# Patient Record
Sex: Female | Born: 1935 | Race: White | Hispanic: No | Smoking: Former smoker
Health system: Southern US, Community
[De-identification: ages and names within clinical notes are randomized; demographics above are authoritative.]

## PROBLEM LIST (undated history)

## (undated) DIAGNOSIS — I482 Chronic atrial fibrillation, unspecified: Secondary | ICD-10-CM

## (undated) DIAGNOSIS — J449 Chronic obstructive pulmonary disease, unspecified: Secondary | ICD-10-CM

## (undated) DIAGNOSIS — G629 Polyneuropathy, unspecified: Secondary | ICD-10-CM

## (undated) DIAGNOSIS — J40 Bronchitis, not specified as acute or chronic: Secondary | ICD-10-CM

## (undated) DIAGNOSIS — E785 Hyperlipidemia, unspecified: Secondary | ICD-10-CM

## (undated) DIAGNOSIS — E7800 Pure hypercholesterolemia, unspecified: Secondary | ICD-10-CM

## (undated) DIAGNOSIS — E119 Type 2 diabetes mellitus without complications: Secondary | ICD-10-CM

## (undated) DIAGNOSIS — I10 Essential (primary) hypertension: Secondary | ICD-10-CM

## (undated) DIAGNOSIS — I5030 Unspecified diastolic (congestive) heart failure: Secondary | ICD-10-CM

## (undated) HISTORY — DX: Chronic obstructive pulmonary disease, unspecified: J44.9

## (undated) HISTORY — DX: Hyperlipidemia, unspecified: E78.5

## (undated) HISTORY — PX: BLADDER SURGERY: SHX569

## (undated) HISTORY — DX: Essential (primary) hypertension: I10

## (undated) HISTORY — PX: CHOLECYSTECTOMY: SHX55

## (undated) HISTORY — DX: Bronchitis, not specified as acute or chronic: J40

## (undated) HISTORY — PX: APPENDECTOMY: SHX54

## (undated) HISTORY — PX: THYROIDECTOMY, PARTIAL: SHX18

## (undated) HISTORY — PX: TOTAL ABDOMINAL HYSTERECTOMY: SHX209

## (undated) HISTORY — DX: Polyneuropathy, unspecified: G62.9

## (undated) HISTORY — PX: TONSILLECTOMY: SHX5217

---

## 2001-04-14 ENCOUNTER — Ambulatory Visit (HOSPITAL_COMMUNITY): Admission: RE | Admit: 2001-04-14 | Discharge: 2001-04-14 | Payer: Self-pay | Admitting: Internal Medicine

## 2001-04-14 ENCOUNTER — Encounter: Payer: Self-pay | Admitting: Internal Medicine

## 2002-02-23 ENCOUNTER — Encounter: Payer: Self-pay | Admitting: Internal Medicine

## 2002-02-23 ENCOUNTER — Ambulatory Visit (HOSPITAL_COMMUNITY): Admission: RE | Admit: 2002-02-23 | Discharge: 2002-02-23 | Payer: Self-pay | Admitting: Internal Medicine

## 2002-05-17 ENCOUNTER — Encounter: Payer: Self-pay | Admitting: Orthopaedic Surgery

## 2002-05-17 ENCOUNTER — Ambulatory Visit (HOSPITAL_COMMUNITY): Admission: RE | Admit: 2002-05-17 | Discharge: 2002-05-17 | Payer: Self-pay | Admitting: Orthopaedic Surgery

## 2003-04-19 ENCOUNTER — Encounter: Payer: Self-pay | Admitting: Internal Medicine

## 2003-04-19 ENCOUNTER — Ambulatory Visit (HOSPITAL_COMMUNITY): Admission: RE | Admit: 2003-04-19 | Discharge: 2003-04-19 | Payer: Self-pay | Admitting: Internal Medicine

## 2004-06-12 ENCOUNTER — Emergency Department (HOSPITAL_COMMUNITY): Admission: EM | Admit: 2004-06-12 | Discharge: 2004-06-12 | Payer: Self-pay | Admitting: Emergency Medicine

## 2005-03-11 ENCOUNTER — Ambulatory Visit (HOSPITAL_COMMUNITY): Admission: RE | Admit: 2005-03-11 | Discharge: 2005-03-11 | Payer: Self-pay | Admitting: Internal Medicine

## 2005-03-21 ENCOUNTER — Ambulatory Visit (HOSPITAL_COMMUNITY): Admission: RE | Admit: 2005-03-21 | Discharge: 2005-03-21 | Payer: Self-pay | Admitting: Internal Medicine

## 2006-02-03 ENCOUNTER — Ambulatory Visit (HOSPITAL_COMMUNITY): Admission: RE | Admit: 2006-02-03 | Discharge: 2006-02-03 | Payer: Self-pay | Admitting: Internal Medicine

## 2007-08-10 ENCOUNTER — Ambulatory Visit (HOSPITAL_COMMUNITY): Admission: RE | Admit: 2007-08-10 | Discharge: 2007-08-10 | Payer: Self-pay | Admitting: Internal Medicine

## 2007-11-17 ENCOUNTER — Encounter: Payer: Self-pay | Admitting: Internal Medicine

## 2007-11-17 ENCOUNTER — Ambulatory Visit (HOSPITAL_COMMUNITY): Admission: RE | Admit: 2007-11-17 | Discharge: 2007-11-17 | Payer: Self-pay | Admitting: Family Medicine

## 2008-01-08 ENCOUNTER — Emergency Department (HOSPITAL_COMMUNITY): Admission: EM | Admit: 2008-01-08 | Discharge: 2008-01-09 | Payer: Self-pay | Admitting: Emergency Medicine

## 2008-03-07 ENCOUNTER — Encounter: Payer: Self-pay | Admitting: Internal Medicine

## 2008-06-07 ENCOUNTER — Ambulatory Visit: Payer: Self-pay | Admitting: Ophthalmology

## 2008-11-15 ENCOUNTER — Encounter: Payer: Self-pay | Admitting: Internal Medicine

## 2008-11-15 ENCOUNTER — Ambulatory Visit (HOSPITAL_COMMUNITY): Admission: RE | Admit: 2008-11-15 | Discharge: 2008-11-15 | Payer: Self-pay | Admitting: Pulmonary Disease

## 2008-11-16 ENCOUNTER — Encounter: Payer: Self-pay | Admitting: Internal Medicine

## 2008-12-26 ENCOUNTER — Encounter: Payer: Self-pay | Admitting: Internal Medicine

## 2009-01-13 ENCOUNTER — Ambulatory Visit: Payer: Self-pay | Admitting: Internal Medicine

## 2009-01-13 DIAGNOSIS — E785 Hyperlipidemia, unspecified: Secondary | ICD-10-CM

## 2009-01-13 DIAGNOSIS — J449 Chronic obstructive pulmonary disease, unspecified: Secondary | ICD-10-CM

## 2009-01-13 DIAGNOSIS — E1142 Type 2 diabetes mellitus with diabetic polyneuropathy: Secondary | ICD-10-CM | POA: Insufficient documentation

## 2009-01-16 ENCOUNTER — Telehealth (INDEPENDENT_AMBULATORY_CARE_PROVIDER_SITE_OTHER): Payer: Self-pay | Admitting: *Deleted

## 2009-01-20 ENCOUNTER — Encounter: Payer: Self-pay | Admitting: Internal Medicine

## 2009-01-20 ENCOUNTER — Ambulatory Visit (HOSPITAL_COMMUNITY): Admission: RE | Admit: 2009-01-20 | Discharge: 2009-01-20 | Payer: Self-pay | Admitting: General Surgery

## 2009-02-15 ENCOUNTER — Ambulatory Visit: Payer: Self-pay | Admitting: Internal Medicine

## 2009-02-15 DIAGNOSIS — R911 Solitary pulmonary nodule: Secondary | ICD-10-CM

## 2009-02-17 ENCOUNTER — Telehealth: Payer: Self-pay | Admitting: Internal Medicine

## 2009-02-24 ENCOUNTER — Encounter: Payer: Self-pay | Admitting: Internal Medicine

## 2009-02-25 ENCOUNTER — Encounter: Payer: Self-pay | Admitting: Internal Medicine

## 2009-02-27 ENCOUNTER — Ambulatory Visit (HOSPITAL_COMMUNITY): Admission: RE | Admit: 2009-02-27 | Discharge: 2009-02-27 | Payer: Self-pay | Admitting: Pulmonary Disease

## 2009-02-27 ENCOUNTER — Encounter: Payer: Self-pay | Admitting: Internal Medicine

## 2009-05-01 ENCOUNTER — Ambulatory Visit: Payer: Self-pay | Admitting: Orthopedic Surgery

## 2009-05-01 DIAGNOSIS — M1710 Unilateral primary osteoarthritis, unspecified knee: Secondary | ICD-10-CM

## 2009-05-01 DIAGNOSIS — M25569 Pain in unspecified knee: Secondary | ICD-10-CM

## 2009-06-16 ENCOUNTER — Ambulatory Visit: Payer: Self-pay | Admitting: Internal Medicine

## 2009-07-03 ENCOUNTER — Telehealth: Payer: Self-pay | Admitting: Internal Medicine

## 2009-08-02 ENCOUNTER — Ambulatory Visit: Payer: Self-pay | Admitting: Orthopedic Surgery

## 2009-08-10 ENCOUNTER — Telehealth: Payer: Self-pay | Admitting: Orthopedic Surgery

## 2009-08-10 ENCOUNTER — Ambulatory Visit: Payer: Self-pay | Admitting: Orthopedic Surgery

## 2009-08-18 ENCOUNTER — Ambulatory Visit: Payer: Self-pay | Admitting: Internal Medicine

## 2009-08-18 DIAGNOSIS — J441 Chronic obstructive pulmonary disease with (acute) exacerbation: Secondary | ICD-10-CM

## 2009-08-29 ENCOUNTER — Ambulatory Visit (HOSPITAL_COMMUNITY): Admission: RE | Admit: 2009-08-29 | Discharge: 2009-08-29 | Payer: Self-pay | Admitting: Internal Medicine

## 2009-12-11 ENCOUNTER — Encounter: Payer: Self-pay | Admitting: Internal Medicine

## 2009-12-11 ENCOUNTER — Ambulatory Visit: Payer: Self-pay | Admitting: Orthopedic Surgery

## 2009-12-13 ENCOUNTER — Encounter: Payer: Self-pay | Admitting: Orthopedic Surgery

## 2009-12-22 ENCOUNTER — Ambulatory Visit: Payer: Self-pay | Admitting: Internal Medicine

## 2010-01-18 ENCOUNTER — Ambulatory Visit: Payer: Self-pay | Admitting: Internal Medicine

## 2010-02-22 ENCOUNTER — Ambulatory Visit: Payer: Self-pay | Admitting: Internal Medicine

## 2010-05-16 ENCOUNTER — Telehealth (INDEPENDENT_AMBULATORY_CARE_PROVIDER_SITE_OTHER): Payer: Self-pay | Admitting: *Deleted

## 2010-06-04 ENCOUNTER — Telehealth (INDEPENDENT_AMBULATORY_CARE_PROVIDER_SITE_OTHER): Payer: Self-pay | Admitting: *Deleted

## 2010-06-07 ENCOUNTER — Ambulatory Visit: Payer: Self-pay | Admitting: Internal Medicine

## 2010-06-07 ENCOUNTER — Telehealth (INDEPENDENT_AMBULATORY_CARE_PROVIDER_SITE_OTHER): Payer: Self-pay | Admitting: *Deleted

## 2010-07-12 ENCOUNTER — Telehealth (INDEPENDENT_AMBULATORY_CARE_PROVIDER_SITE_OTHER): Payer: Self-pay | Admitting: *Deleted

## 2010-08-07 ENCOUNTER — Ambulatory Visit: Payer: Self-pay | Admitting: Internal Medicine

## 2010-09-24 ENCOUNTER — Ambulatory Visit: Payer: Self-pay | Admitting: Ophthalmology

## 2010-10-09 ENCOUNTER — Ambulatory Visit: Payer: Self-pay | Admitting: Ophthalmology

## 2010-10-22 ENCOUNTER — Telehealth (INDEPENDENT_AMBULATORY_CARE_PROVIDER_SITE_OTHER): Payer: Self-pay | Admitting: *Deleted

## 2010-11-22 ENCOUNTER — Telehealth (INDEPENDENT_AMBULATORY_CARE_PROVIDER_SITE_OTHER): Payer: Self-pay | Admitting: *Deleted

## 2010-12-05 ENCOUNTER — Ambulatory Visit
Admission: RE | Admit: 2010-12-05 | Discharge: 2010-12-05 | Payer: Self-pay | Source: Home / Self Care | Attending: Internal Medicine | Admitting: Internal Medicine

## 2010-12-11 NOTE — Progress Notes (Signed)
Summary: sob  Phone Note Call from Patient   Caller: Patient Call For: young Summary of Call: need to talk to nurse about breathing problem antibiotic have not helped Initial call taken by: Rickard Patience,  June 07, 2010 11:11 AM  Follow-up for Phone Call        Spoke with pt.  She states that she is having alot of SOB, sounded like she was having diss with breathing on the phone, had to pause frequently to catch her breath.  I sched her appt with Dr Maple Hudson for this afternoon at 3:45 pm this afternoon. ER sooner if needed. Follow-up by: Vernie Murders,  June 07, 2010 11:16 AM

## 2010-12-11 NOTE — Progress Notes (Signed)
Summary: req maintainence prednisone > ok for prednisone 5mg  until appt  Phone Note Call from Patient   Caller: Patient Call For: young Summary of Call: would like to no if she can get a prescript for prednisone for  copd [place] [facility] Initial call taken by: Rickard Patience,  July 12, 2010 3:45 PM  Follow-up for Phone Call        Spoke with pt.  She was last seen here 06/07/10 and given rx for prednisone- she down to 1 tablet left- and states that she is doing really well with breathing but fears that she will decline again after she stops pred.  She would like to know if Dr. Maple Hudson thinks it may be helpful for her to stay on low dose maint pred.  Pls advise, thanks! nkda Follow-up by: Vernie Murders,  July 12, 2010 3:53 PM  Additional Follow-up for Phone Call Additional follow up Details #1::        Try prednisone 5mg  # 10, Take one daily till apt on Sept 7 Additional Follow-up by: Waymon Budge MD,  July 13, 2010 9:09 AM    Additional Follow-up for Phone Call Additional follow up Details #2::    Dr. Maple Hudson, I looked in pt's appt list to verify next ov and her appt is actually on Sept 27th.  Do you want to give her enough prednisone to last unitl then taking 5 mg once daily? Pls advise thanks! Follow-up by: Vernie Murders,  July 13, 2010 9:13 AM  Additional Follow-up for Phone Call Additional follow up Details #3:: Details for Additional Follow-up Action Taken: OK to continue maintenance till next appointment- Thanks. Additional Follow-up by: Waymon Budge MD,  July 13, 2010 10:05 AM  New/Updated Medications: PREDNISONE 5 MG TABS (PREDNISONE) Take 1 tablet by mouth once a day until 08-07-10 appointment Prescriptions: PREDNISONE 5 MG TABS (PREDNISONE) Take 1 tablet by mouth once a day until 08-07-10 appointment  #25 x 0   Entered by:   Boone Master CNA/MA   Authorized by:   Waymon Budge MD   Signed by:   Boone Master CNA/MA on 07/13/2010   Method  used:   Electronically to        [facility] (retail)       [address] [place], [place]  [address]       Ph: [phone]       Fax: [phone]   RxID:   [id number]    called spoke with patient, advised of CDYs recs as stated above.  pt verbalized her understanding.  rx for prednisone 5mg  #25, 1 by mouth once daily no refills sent to [facility]. Boone Master CNA/MA  July 13, 2010 10:12 AM

## 2010-12-11 NOTE — Progress Notes (Signed)
Summary: bronchitis  Phone Note Call from Patient   Caller: Patient Call For: young Summary of Call: pt have bronchitis and wheezing using inhaler want to know whatelse she can take  [place] [facility] pls have med delivered Initial call taken by: Rickard Patience,  May 16, 2010 2:15 PM  Follow-up for Phone Call        called spoke with patient who c/o wheezing, cough occasionally producing clear-creamy colored mucus, increased SOB x2days.  denies f/c/s.  pt states she had these same symptoms 1 month ago and her PCP gave her a pred taper that resolved the symptoms.  pt has upcoming appt w/ CDY 8.12.11  please advise, thanks!  ALLERGIES: NKDA Follow-up by: Boone Master CNA/MA,  May 16, 2010 2:29 PM  Additional Follow-up for Phone Call Additional follow up Details #1::        Per CDY- give Prednisone 10mg  #20 take 4 x 2 days, 3 x 2 days, 2 x 2 days,  1 x 2 days, then stop; no refills and MUST keep appt in August with CDY.Reynaldo Minium CMA  May 16, 2010 3:04 PM     Additional Follow-up for Phone Call Additional follow up Details #2::    pt aware and med sent to pharmacy told them to deliver to pt Follow-up by: Philipp Deputy CMA,  May 16, 2010 3:16 PM  New/Updated Medications: PREDNISONE 10 MG TABS (PREDNISONE) 4 tabs x 2 days,3 tabs x 2, 2 tabs x 2 days, 1 tabs x 2 days then stop Prescriptions: PREDNISONE 10 MG TABS (PREDNISONE) 4 tabs x 2 days,3 tabs x 2, 2 tabs x 2 days, 1 tabs x 2 days then stop  #20 x 0   Entered by:   Philipp Deputy CMA   Authorized by:   Waymon Budge MD   Signed by:   Philipp Deputy CMA on 05/16/2010   Method used:   Electronically to        [facility] (retail)       [address]       [address] [place], [place]  [address]       Ph: [phone]       Fax: [phone]   RxID:   [id number]

## 2010-12-11 NOTE — Progress Notes (Signed)
Summary: BRONCHITIS  Phone Note Call from Patient   Caller: Patient Call For: Carrie Heath Summary of Call: NEED SOMETHING FOR BRONCHITIS PHARMACY [place] [facility] Initial call taken by: Rickard Patience,  June 04, 2010 10:28 AM  Follow-up for Phone Call        called and spoke with pt and she stated that she thinks she has the bronchitis again---coughing with SOB--SOB with walking and she has to use the oxygen all the time now.  she has appt on 8-12 to see CY--please advise. thanks Randell Loop CMA  June 04, 2010 10:49 AM   Additional Follow-up for Phone Call Additional follow up Details #1::        Offer her a Zpak, pending her appointment please. Additional Follow-up by: Waymon Budge MD,  June 04, 2010 12:04 PM    Additional Follow-up for Phone Call Additional follow up Details #2::    called spoke with patient, advised of CDY's recs as stated above.  rx sent to pt's verified pharmacy.  pt will keep upcoming appt 8.12.11. Boone Master CNA/MA  June 04, 2010 12:10 PM   New/Updated Medications: ZITHROMAX Z-PAK 250 MG TABS (AZITHROMYCIN) 2 today then 1 daily till gone Prescriptions: ZITHROMAX Z-PAK 250 MG TABS (AZITHROMYCIN) 2 today then 1 daily till gone  #8 x 0   Entered by:   Boone Master CNA/MA   Authorized by:   Waymon Budge MD   Signed by:   Boone Master CNA/MA on 06/04/2010   Method used:   Electronically to        [facility] (retail)       [address]       [place], [place]  [address]       Ph: [phone]       Fax: [phone]   RxID:   [id number]

## 2010-12-11 NOTE — Assessment & Plan Note (Signed)
Summary: 4 months/ mbw   Copy to:  Juanetta Gosling Primary Provider/Referring Provider:  Dwana Melena  CC:  Follow up visit-frequent nose bleeds(fresh blood)..  History of Present Illness: 02/15/09 Chronic bronchits                        Son is here and participates Slept poorly last night- heat and increased cough.  Having pains left parasternal area, intermittent dull ache, relieved by rolling over at night, and noticed more positionally rather than exertonal. Not related to eating or swallowing. Has chroinic tendency to food hangup at that level. Papaya tabs help as needed.  We reviewed CT CHEST from January- small nonspecific nodules, degenerative disk disease. Symbicort and nebulizer help temporarily but trigger some productive cough. Notes feet get dusky with sitting. PFT-01/20/09- Severe obstructive disease, FEV1/FVC 0.47. No response. Mild restriction.  August 18, 2009 Chronic cough, chronic bronchitis................sister in law here She is facing potential  right TKR- Dr Romeo Apple. Had flu vax. No breathing change since August.  Daily cough, white, no blood. Repeat CT 02/27/09- Stable noncalcified pulmonary nodules- recommend f/u 6 months. Incidental prom L thyroid.  December 22, 2009- Chronic cough, chronic bronchitis....................son here Epistaxis. Facing knee replacement surgery- asks clearance. Dr Romeo Apple is concerned about her bronchitisand potential fluid shifts. Severe Obstructive airways disease on PFT 01/2009, with no respnse to dilator and FEV1/FVC 0.47. CT chest reviewed- Stable nodules x 9 months, rec f/u in 15 months. Stable left thyroid nodule.,  Notes a little wet nose, and denies any sense of reflux. Still takes acid blocker. If doesn't chew enough she may feel some hesitation on swallowing, but deneis choke or strangle while eating. Cold weather doesn't bother her, but she disliked hot humidity last summer. Comboination of Symbicort and Qvar hasn't made great  difference.   Current Medications (verified): 1)  Vitamin E 400 Unit Caps (Vitamin E) .... Take 1 By Mouth Once Daily 2)  Fish Oil 1000 Mg Caps (Omega-3 Fatty Acids) .... Take 3 By Mouth Once Daily 3)  Glucosamine Complex  Tabs (Nutritional Supplements) .... Take 2 By Mouth Once Daily 4)  Calcium 500 Mg Tabs (Calcium Carbonate) .... Take 2 By Mouth Once Daily 5)  Eq Ibuprofen 200 Mg Caps (Ibuprofen) .... Take 4 By Mouth Once Daily 6)  Vitamin C 500 Mg Tabs (Ascorbic Acid) .... Take 1 By Mouth Once Daily 7)  Bayer Aspirin Ec Low Dose 81 Mg Tbec (Aspirin) .... Take 1 By Mouth Once Daily 8)  Gabapentin 300 Mg Caps (Gabapentin) .... Take 1 By Mouth Once Daily 9)  Metformin Hcl 500 Mg Tabs (Metformin Hcl) .... Take 1 By Mouth Two Times A Day 10)  Vytorin 10-20 Mg Tabs (Ezetimibe-Simvastatin) .... Take 1/2 By Mouth Once Daily 11)  Premarin 0.3 Mg Tabs (Estrogens Conjugated) .... Take 1 By Mouth Once Daily 12)  Symbicort 160-4.5 Mcg/act Aero (Budesonide-Formoterol Fumarate) .... 2 Puffs and Rinse Twice Daily 13)  Proair Hfa 108 (90 Base) Mcg/act Aers (Albuterol Sulfate) .... 2 Puffs Four Times A Day As Needed 14)  Albuterol Sulfate (2.5 Mg/26ml) 0.083% Nebu (Albuterol Sulfate) .Marland Kitchen.. 1 Neb Four Times A Day As Needed 15)  Qvar 80 Mcg/act Aers (Beclomethasone Dipropionate) .... 2 Puffs and Rinse Well, Twice Daily After Symbicort. 16)  Lift Chair .... Having Total Knee Replacement  Allergies (verified): No Known Drug Allergies  Past History:  Past Medical History: Last updated: 08/02/2009 Diabetes, Type 2 Bronchitis Hyperlipidemia HTN neuropathy  Past Surgical  History: Last updated: 05/01/2009 partial thyroidectomy- remote Tonsillectomy Appendectomy Cholecystectomy Total Abdominal Hysterectomy bladder surgery  Family History: Last updated: 21-Jan-2009 Father and brother died of heart disease- brother was 38 yo. 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  The patient denies anorexia, fever, weight loss, weight gain, vision loss, decreased hearing, hoarseness, chest pain, syncope, headaches, hemoptysis, abdominal pain, and severe indigestion/heartburn.    Vital Signs:  Patient profile:   77 year old female Height:      61 inches Weight:      139.25 pounds O2 Sat:      94 % on Room air Pulse rate:   78 / minute BP sitting:   130 / 80  (left arm) Cuff size:   regular  Vitals Entered By: Reynaldo Minium CMA (December 22, 2009 10:49 AM)  O2 Flow:  Room air  Physical Exam  Additional Exam:  General: A/Ox3; pleasant and cooperative, NAD, slender elderly woman SKIN: no rash, lesions NODES: no lymphadenopathy HEENT: [place]/AT, EOM- WNL, Conjuctivae- clear, PERRLA, TM-WNL, Nose- clear, Throat- clear and wnl, missing tooth, upper and lower partial plates NECK: Supple w/ fair ROM, JVD- none, normal carotid impulses w/o bruits Thyroid-  CHEST: Active cough, wheezy rhonchi diffusely but unlabored. Coarse rhonchi right back with deep breath. HEART: RRR, no m/g/r heard ABDOMEN:  AVW:[id number], nl pulses, no edema  NEURO: Grossly intact to observation      Impression & Recommendations:  Problem # 1:  COPD (ICD-496) Chronic bronchitis- much of this may be passive tracheomalacia, which has no potential for response to meds. I will try to get a detailed PFT here. She describes only scant sputum. I can't tell that high dose inhaled steroids has been cost effective. She had tried Z pak and prednisone before. We will try  a round of Avelox. As she runs out of Symbicort and Qvar I will have her stop  to see how she does. She is about at her baseline.  She is at higher than average risk for surgical complications, but I think she will get through necessary surgery with post op attention to her status. She would be at some risk of fluid overload, but probably not to a degree unusual for her age.  Medications Added to Medication List This Visit: 1)  Avelox 400 Mg Tabs (Moxifloxacin hcl) .Marland Kitchen.. 1 daily  Other Orders: Est. Patient Level III (19147)  Patient Instructions: 1)  Please schedule a follow-up appointment in 3 weeks. 2)  OK to stop Symbicort and Qvar as you run out of them. 3)  Try saline nasal gel for dry nose and nose bleeds. 4)  Schedule PFT here 5)  I anticipate ability to tolerate necessary surgery and anesthesia, but recognize increased chance of difficuly from secretion retention and fluid overload. These will have to be watched closely, but I don't see a lot more to do preop. Prescriptions: AVELOX 400 MG TABS (MOXIFLOXACIN HCL) 1 daily  #7 x 0   Entered and Authorized by:   Waymon Budge MD   Signed by:   Waymon Budge MD on 12/22/2009   Method used:   Electronically to  [facility] (retail)       [address]       [place], [place]  [address]       Ph: [phone]       Fax: [phone]   RxID:   [id number]

## 2010-12-11 NOTE — Assessment & Plan Note (Signed)
Summary: 1 month/apc   Copy to:  Juanetta Gosling Primary Provider/Referring Provider:  Timothy Lasso [facility]/ [place]  CC:  1 month follow up visit .  History of Present Illness: December 22, 2009- Chronic cough, chronic bronchitis....................son here Epistaxis. Facing knee replacement surgery- asks clearance. Dr Romeo Apple is concerned about her bronchitisand potential fluid shifts. Severe Obstructive airways disease on PFT 01/2009, with no respnse to dilator and FEV1/FVC 0.47. CT chest reviewed- Stable nodules x 9 months, rec f/u in 15 months. Stable left thyroid nodule.,  Notes a little wet nose, and denies any sense of reflux. Still takes acid blocker. If doesn't chew enough she may feel some hesitation on swallowing, but deneis choke or strangle while eating. Cold weather doesn't bother her, but she disliked hot humidity last summer. Comboination of Symbicort and Qvar hasn't made great difference.  January 18, 2010- Chronic cough, chronic bronchitis............................family here Did PFT "hard work". Breathing has seemed a little shorter for the past week, with light cough nonproductive. Feet may swell. Denies chest pain, discolored sputum or fever. Easily dyspneic around the house. She finished and quit Symbicort as planned. Recurrent epistaxis last 3 days. CT reviewed again- stable nodules and CAD.  February 22, 2010- Chronic cough, chronic bronchitis.......................... daughter here Staying in to avoid pollen. Used Zpak- did help. She is now taking Maxzide again. Not getting anything out with cough. Ranb out of Qvar recently. Using Brovana neb 1-2x daily. Still sleeps w/ O2. Feet get bluish with sitting.She may get some claudication type ache but it was difficult to get a clear description from her. She takes intermittent Premarin for burning feet.      Current Medications (verified): 1)  Vitamin E 400 Unit Caps (Vitamin E) .... Take 1 By Mouth Once Daily 2)  Fish Oil 1000 Mg Caps  (Omega-3 Fatty Acids) .... Take 3 By Mouth Once Daily 3)  Glucosamine-Chondroitin  Tabs (Glucosamine-Chondroit-Vit C-Mn) .... Take 2 By Mouth Once Daily 4)  Calcium 500 Mg Tabs (Calcium Carbonate) .... Take 2 By Mouth Once Daily 5)  Eq Ibuprofen 200 Mg Caps (Ibuprofen) .... Take 4 By Mouth Once Daily 6)  Vitamin C 500 Mg Tabs (Ascorbic Acid) .... Take 1 By Mouth Once Daily 7)  Bayer Aspirin Ec Low Dose 81 Mg Tbec (Aspirin) .... Take 1 By Mouth Once Daily 8)  Gabapentin 300 Mg Caps (Gabapentin) .... Take 1 By Mouth Once Daily 9)  Metformin Hcl 500 Mg Tabs (Metformin Hcl) .... Take 1 By Mouth Two Times A Day 10)  Vytorin 10-20 Mg Tabs (Ezetimibe-Simvastatin) .... Take 1/2 By Mouth Once Daily 11)  Premarin 0.3 Mg Tabs (Estrogens Conjugated) .... Take 1 By Mouth Once Daily 12)  Proair Hfa 108 (90 Base) Mcg/act Aers (Albuterol Sulfate) .... 2 Puffs Four Times A Day As Needed 13)  Albuterol Sulfate (2.5 Mg/90ml) 0.083% Nebu (Albuterol Sulfate) .Marland Kitchen.. 1 Neb Four Times A Day As Needed 14)  Qvar 80 Mcg/act Aers (Beclomethasone Dipropionate) .... 2 Puffs and Rinse Well, Twice Daily After Symbicort. 15)  Lift Chair .... Having Total Knee Replacement 16)  Oxygen 2l/m .... All The Time 17)  Triamterene-Hctz 37.5-25 Mg Tabs (Triamterene-Hctz) .Marland Kitchen.. 1 Daily As Diuretic 18)  Brovana 15 Mcg/44ml Nebu (Arformoterol Tartrate) .Marland Kitchen.. 1 Neb Twice Daily As Needed  Allergies (verified): No Known Drug Allergies  Past History:  Past Surgical History: Last updated: 05/01/2009 partial thyroidectomy- remote Tonsillectomy Appendectomy Cholecystectomy Total Abdominal Hysterectomy bladder surgery  Family History: Last updated: 02/12/09 Father and brother died of heart disease-  brother was 36 yo. 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  The patient denies anorexia, fever, weight loss, weight gain, vision loss, decreased hearing, hoarseness, chest pain, syncope, peripheral edema, headaches, hemoptysis, abdominal pain, and severe indigestion/heartburn.    Vital Signs:  Patient profile:   77 year old female Height:      61 inches Weight:      140.25 pounds O2 Sat:      95 % on Room air Pulse rate:   71 / minute BP sitting:   132 / 70  (left arm) Cuff size:   regular  Vitals Entered By: Reynaldo Minium CMA (February 22, 2010 10:59 AM)  O2 Flow:  Room air  Physical Exam  Additional Exam:  General: A/Ox3; pleasant and cooperative, NAD, slender elderly woman, wheel chair, supplemental oxygen 2 L/M 95%. SKIN: no rash, lesions NODES: no lymphadenopathy HEENT: [place]/AT, EOM- WNL, Conjuctivae- clear, PERRLA, TM-WNL, Nose- clea, minor scabbing in leftr, Throat- clear and wnl, missing tooth, upper and lower partial plates NECK: Supple w/ fair ROM, JVD- 1 cm, normal carotid impulses w/o bruits Thyroid-  CHEST: Clear to P&A with no wheeze or rales HEART: RRR, no m/g/r heard ABDOMEN:  ZOX:WRUE, nl pulses, trace edema, slightly dusky cool feet NEURO: Grossly intact to observation      Impression & Recommendations:  Problem # 1:  COPD (ICD-496) Moderate COPD by PFT with FEV1/FVC 0.54 and no response to BD. There may be some fluid overload component- diuresis seems to help. She may not need a maintenance inhaled steroid so we will watch off Qvar to clarify the issue. She can use her nebulizer as needed.  She is frail, but could probably get through knee surgery if necessary.  Medications Added to Medication List This Visit: 1)  Glucosamine-chondroitin Tabs  (Glucosamine-chondroit-vit c-mn) .... Take 2 by mouth once daily  Other Orders: Est. Patient Level III (45409)  Patient Instructions: 1)  Please schedule a follow-up appointment in 4 months. 2)  Continue Oxygen 2 L/M for sleep. 3)  Ok to stay off Qvar for now. 4)  continue the diuretic 5)  Continue the Brovana or albuiterol by neb, as needed. Some days you might not need your nebulizer.

## 2010-12-11 NOTE — Assessment & Plan Note (Signed)
Summary: rov 2 months//kp   Copy to:  Juanetta Gosling Primary Provider/Referring Provider:  Timothy Lasso Hall/ [place]  CC:  2 month follow up visit-COPD; likes Prednisone 5mg -doing well.Marland Kitchen  History of Present Illness: February 22, 2010- Chronic cough, chronic bronchitis.......................... daughter here Staying in to avoid pollen. Used Zpak- did help. She is now taking Maxzide again. Not getting anything out with cough. Ranb out of Qvar recently. Using Brovana neb 1-2x daily. Still sleeps w/ O2. Feet get bluish with sitting.She may get some claudication type ache but it was difficult to get a clear description from her. She takes intermittent Premarin for burning feet.  June 07, 2010- Chronic cough, bronchitis....................family here Acute visit. Staying on oxygen x 3 weeks. Congested and wheezing w/ no fever. Feet swelling. No infection, but tried erythromycin- no help. Clear mucus, no blood or chest pain. She and her family note that she does better on prednisone and ask about low-dose maintenance- discussed.  August 07, 2010- Chronic cough, bronchitis...........................daughter here She feels very much better on prednisone 5 mg daily, since she called asking to try maintenance.. We immediately discussed steroid side effects. Denies wheezing, not wakig at night, not coughing. Arthritis slows her more than her breathing. Not using oxygen at all right now, which is a big improvement over last visit..Still has O2 concentrator at home. Not using any of her inhalers now. Has some Brovana in the refrigerator if needed.    Preventive Screening-Counseling & Management  Alcohol-Tobacco     Smoking Status: quit     Year Started: 1958     Year Quit: 2008  Current Medications (verified): 1)  Vitamin E 400 Unit Caps (Vitamin E) .... Take 1 By Mouth Once Daily 2)  Fish Oil 1000 Mg Caps (Omega-3 Fatty Acids) .... Take 3 By Mouth Once Daily 3)  Glucosamine-Chondroitin  Tabs  (Glucosamine-Chondroit-Vit C-Mn) .... Take 2 By Mouth Once Daily 4)  Calcium 500 Mg Tabs (Calcium Carbonate) .... Take 2 By Mouth Once Daily 5)  Eq Ibuprofen 200 Mg Caps (Ibuprofen) .... Take 4 By Mouth Once Daily 6)  Vitamin C 500 Mg Tabs (Ascorbic Acid) .... Take 1 By Mouth Once Daily 7)  Bayer Aspirin Ec Low Dose 81 Mg Tbec (Aspirin) .... Take 1 By Mouth Once Daily 8)  Gabapentin 300 Mg Caps (Gabapentin) .... Take 1 By Mouth Once Daily 9)  Metformin Hcl 500 Mg Tabs (Metformin Hcl) .... Take 1 By Mouth Two Times A Day 10)  Vytorin 10-20 Mg Tabs (Ezetimibe-Simvastatin) .... Take 1/2 By Mouth Once Daily 11)  Proair Hfa 108 (90 Base) Mcg/act Aers (Albuterol Sulfate) .... 2 Puffs Four Times A Day As Needed 12)  Albuterol Sulfate (2.5 Mg/32ml) 0.083% Nebu (Albuterol Sulfate) .Marland Kitchen.. 1 Neb Four Times A Day As Needed 13)  Qvar 80 Mcg/act Aers (Beclomethasone Dipropionate) .... 2 Puffs and Rinse Well, Twice Daily After Symbicort. 14)  Lift Chair .... Having Total Knee Replacement 15)  Oxygen 2l/m .... All The Time 16)  Triamterene-Hctz 37.5-25 Mg Tabs (Triamterene-Hctz) .Marland Kitchen.. 1 Daily As Diuretic 17)  Brovana 15 Mcg/62ml Nebu (Arformoterol Tartrate) .Marland Kitchen.. 1 Neb Twice Daily As Needed 18)  Prednisone 5 Mg Tabs (Prednisone) .... Take 1 Tablet By Mouth Once A Day Until 08-07-10 Appointment  Allergies (verified): No Known Drug Allergies  Past History:  Past Medical History: Last updated: 02/22/2010 Diabetes, Type 2 Bronchitis/ COPD Hyperlipidemia HTN neuropathy  Past Surgical History: Last updated: 05/01/2009 partial thyroidectomy- remote Tonsillectomy Appendectomy Cholecystectomy Total Abdominal Hysterectomy bladder surgery  Family  History: Last updated: 01/13/2009 Father and brother died of heart disease- brother was 76 yo. 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    Vital Signs:  Patient profile:   77 year old female Height:      61 inches Weight:      144.50 pounds BMI:     27.40 O2 Sat:      98 % on Room air Pulse rate:   72 / minute BP sitting:   120 / 82  (left arm) Cuff size:   regular  Vitals Entered By: Reynaldo Minium CMA (August 07, 2010 10:38 AM)  O2 Flow:  Room air CC: 2 month follow up visit-COPD; likes Prednisone 5mg -doing well.   Physical Exam  Additional Exam:  General: A/Ox3; pleasant and cooperative, NAD, slender elderly woman, wheel chair, O2 sat 98% on room air at rest. SKIN: no rash, lesions NODES: no lymphadenopathy HEENT: [place]/AT, EOM- WNL, Conjuctivae- clear, PERRLA, TM-WNL, Nose- clear, Throat- clear and wnl, missing tooth, upper and lower partial plates NECK: Supple w/ fair ROM, JVD- 1 cm, normal carotid impulses w/o bruits Thyroid-  CHEST: clear to P&A HEART: RRR, no m/g/r heard ABDOMEN: soft WJX:BJYN, nl pulses, trace edema, slightly dusky cool feet NEURO: Grossly intact to observation      CXR  Procedure date:  06/07/2010  Findings:      DG CHEST 2 VIEW - [id number]   Clinical Data: COPD.  Shortness of breath.  Former smoker.  History of lung nodules by CT.   CHEST - 2 VIEW 06/07/2010:   Comparison: CT chest 08/29/2009, 02/27/2009, and 11/15/2008.  Two- view chest x-ray 08/10/2007.   Findings: Cardiomediastinal silhouette unremarkable for age, unchanged.  Mild  hyperinflation with flattening of hemidiaphragms, unchanged.  Mild central peribronchial thickening, unchanged.  No new pulmonary parenchymal abnormalities.  No visible lung nodules as noted on the prior CT.  Mild degenerative changes throughout the thoracic spine.  Thoracolumbar scoliosis convex right.   IMPRESSION: Stable mild changes of COPD.  No acute cardiopulmonary disease.   Read By:  Arnell Sieving,  M.D.     Released By:  Arnell Sieving,  M.D.   Impression & Recommendations:  Problem # 1:  COPD (ICD-496) Substantially improved on maintnenance prednisone. We will push the prednisone down if tolerated. Try 4 mg daily for now. Discussed oxygen. We will leave her for now with the concentrator at home till we are sure she will be stable, then consider an ONOX on room air.  Problem # 2:  LUNG NODULE (ICD-518.89)  CXR showed no nodules, so these are below detection range and at age 61 we will leave it at that. She and her family were comfortable with thsi approach.  Medications Added to Medication List This Visit: 1)  Prednisone 1 Mg Tabs (Prednisone) .... 4 daily  Other Orders: Est. Patient Level IV (08657) Flu Vaccine 74yrs + MEDICARE PATIENTS (Q4696) Administration Flu vaccine - MCR (G0008)  Patient Instructions: 1)  Please schedule a follow-up appointment in 4 months. 2)  Reduce your prednisone dose to 4 mg daily by using the 1 mg tabs. 3)  Flu vax Prescriptions: PREDNISONE 1 MG TABS (PREDNISONE) 4 daily  #100 x 1   Entered and Authorized by:   Waymon Budge MD   Signed by:   Waymon Budge MD on 08/07/2010   Method used:   Electronically to        [facility] (retail)       [address] [place], [place]  [address]       Ph: [phone]       Fax: [phone]   RxID:   [id number]       Flu Vaccine Consent Questions     Do you have a history of severe allergic reactions to this vaccine? no    Any  prior history of allergic reactions to egg and/or gelatin? no    Do you have a sensitivity to the preservative Thimersol? no    Do you have a past history of Guillan-Barre Syndrome? no    Do you currently have an acute febrile illness? no    Have you ever had a severe reaction to latex? no    Vaccine information given and explained to patient? yes    Are you currently pregnant? no    Lot Number:[id number]   Exp Date:05/11/2011   Site Given  Left Deltoid IMdflu Abigail Miyamoto RN  August 07, 2010 11:25 AM     CXR  Procedure date:  06/07/2010  Findings:      DG CHEST 2 VIEW - [id number]   Clinical Data: COPD.  Shortness of breath.  Former smoker.  History of lung nodules by CT.   CHEST - 2 VIEW 06/07/2010:   Comparison: CT chest 08/29/2009, 02/27/2009, and 11/15/2008.  Two- view chest x-ray 08/10/2007.   Findings: Cardiomediastinal silhouette unremarkable for age, unchanged.  Mild hyperinflation with flattening of hemidiaphragms, unchanged.  Mild central peribronchial thickening, unchanged.  No new pulmonary parenchymal abnormalities.  No visible lung nodules as noted on the prior CT.  Mild degenerative changes throughout the thoracic spine.  Thoracolumbar scoliosis convex right.   IMPRESSION: Stable mild changes of COPD.  No acute cardiopulmonary disease.   Read By:  Arnell Sieving,  M.D.     Released By:  Arnell Sieving,  Judie Petit.D.

## 2010-12-11 NOTE — Letter (Signed)
Summary: *Referral Letter  [facility]  [address]  [place], [place] [address]   Phone: [phone]  Fax: [phone]    12/11/2009  Thank you in advance for evaluating our patient for possible knee replacement surgery.  Carrie Heath [address] [place], [place]  [address]  Phone: [phone]  Reason for Referral: preoperative evaluation for total knee arthroplasty  Procedures Requested: evaluation, treatment and recommendations regarding patient's ability to undergo knee replacement surgery especially since she will not be present for her postop care.I'm specifically concerned about her bronchitis its chronic nature and the fluid shifts are likely to occur after knee replacement.  Current Medical Problems: 1)  COPD (ICD-496) 2)  KNEE, ARTHRITIS, DEGEN./OSTEO (ICD-715.96) 3)  KNEE PAIN (ICD-719.46) 4)  LUNG NODULE (ICD-518.89) 5)  HYPERLIPIDEMIA (ICD-272.4) 6)  Hx of BRONCHITIS (ICD-491.20) 7)  DIABETES, TYPE 2 (ICD-250.00) 8)  UNSPECIFIED CHRONIC BRONCHITIS (ICD-491.9)   Current Medications: 1)  VITAMIN E 400 UNIT CAPS (VITAMIN E) take 1 by mouth once daily 2)  FISH OIL 1000 MG CAPS (OMEGA-3 FATTY ACIDS) take 3 by mouth once daily 3)  GLUCOSAMINE COMPLEX  TABS (NUTRITIONAL SUPPLEMENTS) take 2 by mouth once daily 4)  CALCIUM 500 MG TABS (CALCIUM CARBONATE) take 2 by mouth once daily 5)  BIOTIN 5000 5 MG CAPS (BIOTIN) take 1 by mouth once daily 6)  EQ IBUPROFEN 200 MG CAPS (IBUPROFEN) take 4 by mouth once daily 7)  VITAMIN C 500 MG TABS (ASCORBIC ACID) take 1 by mouth once daily 8)  BAYER ASPIRIN EC LOW DOSE 81 MG TBEC (ASPIRIN) take 1 by mouth once daily 9)  GABAPENTIN 300 MG CAPS (GABAPENTIN) take 1 by mouth once daily 10)  METFORMIN HCL 500 MG TABS (METFORMIN HCL) take 1 by mouth two times a day 11)  VYTORIN 10-20 MG TABS (EZETIMIBE-SIMVASTATIN) take 1/2 by mouth once daily 12)  PREMARIN 0.3 MG TABS (ESTROGENS CONJUGATED) take 1 by  mouth once daily 13)  SYMBICORT 160-4.5 MCG/ACT AERO (BUDESONIDE-FORMOTEROL FUMARATE) 2 puffs and rinse twice daily 14)  PROAIR HFA 108 (90 BASE) MCG/ACT AERS (ALBUTEROL SULFATE) 2 puffs four times a day as needed 15)  ALBUTEROL SULFATE (2.5 MG/3ML) 0.083% NEBU (ALBUTEROL SULFATE) 1 neb four times a day as needed 16)  BENZONATATE 100 MG CAPS (BENZONATATE) 1 four times a day as needed cough 17)  QVAR 80 MCG/ACT AERS (BECLOMETHASONE DIPROPIONATE) 2 puffs and rinse well, twice daily after Symbicort. 18)  * LIFT CHAIR having total knee replacement   Past Medical History: 1)  Diabetes, Type 2 2)  Bronchitis 3)  Hyperlipidemia 4)  HTN 5)  neuropathy  Thank you again for agreeing to see our patient; please contact us if you have any further questions or need additional information.  Sincerely,  Fuller Canada MD

## 2010-12-11 NOTE — Letter (Signed)
Summary: [facility]  [facility]   Imported By: [place] 12/28/2009 07:06:55  _____________________________________________________________________  External Attachment:    Type:   Image     Comment:   External Document

## 2010-12-11 NOTE — Letter (Signed)
Summary: SME correspondence  SME correspondence   Imported By: Cammie Sickle 03/03/2010 12:27:25  _____________________________________________________________________  External Attachment:    Type:   Image     Comment:   External Document

## 2010-12-11 NOTE — Assessment & Plan Note (Signed)
Summary: sob/lmr   Copy to:  Carrie Heath Primary Provider/Referring Provider:  Timothy Lasso Hall/ [place]  CC:  Increased SOB-"rattles" today;"feels good when on Prednisone"..  History of Present Illness: December 22, 2009- Chronic cough, chronic bronchitis....................son here Epistaxis. Facing knee replacement surgery- asks clearance. Dr Romeo Apple is concerned about her bronchitisand potential fluid shifts. Severe Obstructive airways disease on PFT 01/2009, with no respnse to dilator and FEV1/FVC 0.47. CT chest reviewed- Stable nodules x 9 months, rec f/u in 15 months. Stable left thyroid nodule.,  Notes a little wet nose, and denies any sense of reflux. Still takes acid blocker. If doesn't chew enough she may feel some hesitation on swallowing, but deneis choke or strangle while eating. Cold weather doesn't bother her, but she disliked hot humidity last summer. Comboination of Symbicort and Qvar hasn't made great difference.  January 18, 2010- Chronic cough, chronic bronchitis............................family here Did PFT "hard work". Breathing has seemed a little shorter for the past week, with light cough nonproductive. Feet may swell. Denies chest pain, discolored sputum or fever. Easily dyspneic around the house. She finished and quit Symbicort as planned. Recurrent epistaxis last 3 days. CT reviewed again- stable nodules and CAD.  February 22, 2010- Chronic cough, chronic bronchitis.......................... daughter here Staying in to avoid pollen. Used Zpak- did help. She is now taking Maxzide again. Not getting anything out with cough. Ranb out of Qvar recently. Using Brovana neb 1-2x daily. Still sleeps w/ O2. Feet get bluish with sitting.She may get some claudication type ache but it was difficult to get a clear description from her. She takes intermittent Premarin for burning feet.  June 07, 2010- Chronic cough, bronchitis....................family here Acute visit. Staying on oxygen x  3 weeks. Congested and wheezing w/ no fever. Feet swelling. No infection, but tried erythromycin- no help. Clear mucus, no blood or chest pain. She and her family note that she does better on prednisone and ask about low-dose maintenance- discussed.    Preventive Screening-Counseling & Management  Alcohol-Tobacco     Smoking Status: quit     Year Started: 1956     Year Quit: 2008  Current Medications (verified): 1)  Vitamin E 400 Unit Caps (Vitamin E) .... Take 1 By Mouth Once Daily 2)  Fish Oil 1000 Mg Caps (Omega-3 Fatty Acids) .... Take 3 By Mouth Once Daily 3)  Glucosamine-Chondroitin  Tabs (Glucosamine-Chondroit-Vit C-Mn) .... Take 2 By Mouth Once Daily 4)  Calcium 500 Mg Tabs (Calcium Carbonate) .... Take 2 By Mouth Once Daily 5)  Eq Ibuprofen 200 Mg Caps (Ibuprofen) .... Take 4 By Mouth Once Daily 6)  Vitamin C 500 Mg Tabs (Ascorbic Acid) .... Take 1 By Mouth Once Daily 7)  Bayer Aspirin Ec Low Dose 81 Mg Tbec (Aspirin) .... Take 1 By Mouth Once Daily 8)  Gabapentin 300 Mg Caps (Gabapentin) .... Take 1 By Mouth Once Daily 9)  Metformin Hcl 500 Mg Tabs (Metformin Hcl) .... Take 1 By Mouth Two Times A Day 10)  Vytorin 10-20 Mg Tabs (Ezetimibe-Simvastatin) .... Take 1/2 By Mouth Once Daily 11)  Premarin 0.3 Mg Tabs (Estrogens Conjugated) .... Take 1 By Mouth Once Daily 12)  Proair Hfa 108 (90 Base) Mcg/act Aers (Albuterol Sulfate) .... 2 Puffs Four Times A Day As Needed 13)  Albuterol Sulfate (2.5 Mg/34ml) 0.083% Nebu (Albuterol Sulfate) .Marland Kitchen.. 1 Neb Four Times A Day As Needed 14)  Qvar 80 Mcg/act Aers (Beclomethasone Dipropionate) .... 2 Puffs and Rinse Well, Twice Daily After Symbicort. 15)  Lift Chair .Marland KitchenMarland KitchenMarland Kitchen  Having Total Knee Replacement 16)  Oxygen 2l/m .... All The Time 17)  Triamterene-Hctz 37.5-25 Mg Tabs (Triamterene-Hctz) .Marland Kitchen.. 1 Daily As Diuretic 18)  Brovana 15 Mcg/49ml Nebu (Arformoterol Tartrate) .Marland Kitchen.. 1 Neb Twice Daily As Needed 19)  Prednisone 10 Mg Tabs (Prednisone) .... 4  Tabs X 2 Days,3 Tabs X 2, 2 Tabs X 2 Days, 1 Tabs X 2 Days Then Stop 20)  Zithromax Z-Pak 250 Mg Tabs (Azithromycin) .... 2 Today Then 1 Daily Till Gone  Allergies (verified): No Known Drug Allergies  Past History:  Past Medical History: Last updated: 02/22/2010 Diabetes, Type 2 Bronchitis/ COPD Hyperlipidemia HTN neuropathy  Past Surgical History: Last updated: 05/01/2009 partial thyroidectomy- remote Tonsillectomy Appendectomy Cholecystectomy Total Abdominal Hysterectomy bladder surgery  Family History: Last updated: 11-Feb-2009 Father and brother died of heart disease- brother was 29 yo. 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  The patient denies productive cough, coughing up blood, chest pain, irregular heartbeats, acid heartburn, indigestion, loss of appetite, weight change, abdominal pain, difficulty swallowing, sore throat, tooth/dental problems, headaches, nasal congestion/difficulty breathing through nose, and sneezing.    Vital Signs:  Patient profile:   77 year old female Height:      61 inches Weight:      141.38 pounds BMI:     26.81 O2 Sat:      91 % on 2 L/min Pulse rate:   86 / minute BP sitting:   114 / 68  (left arm) Cuff size:   regular  Vitals Entered By: Reynaldo Minium CMA (June 07, 2010 4:16 PM)  O2 Flow:  2 L/min CC: Increased SOB-"rattles" today;"feels good when on Prednisone".   Physical Exam  Additional Exam:  General: A/Ox3; pleasant and cooperative, NAD, slender elderly woman, wheel chair, O2 sat 83% on room air, 91% on 2 L/M.Marland Kitchen SKIN: no rash,  lesions NODES: no lymphadenopathy HEENT: [place]/AT, EOM- WNL, Conjuctivae- clear, PERRLA, TM-WNL, Nose- clear, Throat- clear and wnl, missing tooth, upper and lower partial plates NECK: Supple w/ fair ROM, JVD- 1 cm, normal carotid impulses w/o bruits Thyroid-  CHEST: Distant sunds with bilateral end expiratory wheezes HEART: RRR, no m/g/r heard ABDOMEN:  WUX:LKGM, nl pulses, trace edema, slightly dusky cool feet NEURO: Grossly intact to observation      Impression & Recommendations:  Problem # 1:  COPD (ICD-496) Acute exacerbation over past 2 weeks. I can't tell that she is fluid overloaded and what I really notice is the wheeze, with their comments about benefits of prednisone. DDX asthmatic bronchits vs pulmonary edema on top of her chronic respiratory failure. We will give depo and a steroid taper, get CXR, have her stay on oxygen, and  change antibiotic to doxycycline.  Medications Added to Medication List This Visit: 1)  Prednisone 10 Mg Tabs (Prednisone) .Marland Kitchen.. 1 tab four times daily x 2 days, 3 times daily x 2 days, 2 times daily x 2 days, 1 time daily x 2 days then one every other day 2)  Doxycycline Hyclate 100 Mg Caps (Doxycycline hyclate) .... 2 today then one daily  Other Orders: Est. Patient Level  IV (99214) T-2 View CXR (71020TC)  Patient Instructions: 1)  Please schedule a follow-up appointment in 2 months. 2)  Script for prednisone and for doxycycline sent to your drug store 3)  Depo 80 4)  A chest x-ray has been recommended.  Your imaging study may require preauthorization.  Prescriptions: PREDNISONE 10 MG TABS (PREDNISONE) 1 tab four times daily x 2 days, 3 times daily x 2 days, 2 times daily x 2 days, 1 time daily x 2 days then one every other day  #100 x 0   Entered and Authorized by:   Waymon Budge MD   Signed by:   Waymon Budge MD on 06/07/2010   Method used:   Electronically to        [facility] (retail)       [address] [place], [place]  [address]       Ph: [phone]       Fax: [phone]   RxID:   [id number] DOXYCYCLINE HYCLATE 100 MG CAPS (DOXYCYCLINE HYCLATE) 2 today then one daily  #8 x 0   Entered and Authorized by:   Waymon Budge MD   Signed by:   Waymon Budge MD on 06/07/2010   Method used:   Electronically to        [facility] (retail)       [address] [place], [place]  [address]       Ph: [phone]       Fax: [phone]   RxID:   [id number]

## 2010-12-11 NOTE — Assessment & Plan Note (Signed)
Summary: rov after pft ///kp   Copy to:  Juanetta Gosling Primary Provider/Referring Provider:  Timothy Lasso Hall/ [place]  CC:  Follow up visit after PFT.  History of Present Illness:  August 18, 2009 Chronic cough, chronic bronchitis................sister in law here She is facing potential  right TKR- Dr Romeo Apple. Had flu vax. No breathing change since August.  Daily cough, white, no blood. Repeat CT 02/27/09- Stable noncalcified pulmonary nodules- recommend f/u 6 months. Incidental prom L thyroid.  December 22, 2009- Chronic cough, chronic bronchitis....................son here Epistaxis. Facing knee replacement surgery- asks clearance. Dr Romeo Apple is concerned about her bronchitisand potential fluid shifts. Severe Obstructive airways disease on PFT 01/2009, with no respnse to dilator and FEV1/FVC 0.47. CT chest reviewed- Stable nodules x 9 months, rec f/u in 15 months. Stable left thyroid nodule.,  Notes a little wet nose, and denies any sense of reflux. Still takes acid blocker. If doesn't chew enough she may feel some hesitation on swallowing, but deneis choke or strangle while eating. Cold weather doesn't bother her, but she disliked hot humidity last summer. Comboination of Symbicort and Qvar hasn't made great difference.  January 18, 2010- Chronic cough, chronic bronchitis............................family here Did PFT "hard work". Breathing has seemed a little shorter for the past week, with light cough nonproductive. Feet may swell. Denies chest pain, discolored sputum or fever. Easily dyspneic around the house. She finished and quit Symbicort as planned. Recurrent epistaxis last 3 days. CT reviewed again- stable nodules and CAD.   Current Medications (verified): 1)  Vitamin E 400 Unit Caps (Vitamin E) .... Take 1 By Mouth Once Daily 2)  Fish Oil 1000 Mg Caps (Omega-3 Fatty Acids) .... Take 3 By Mouth Once Daily 3)  Glucosamine Complex  Tabs (Nutritional Supplements) .... Take 2 By Mouth Once  Daily 4)  Calcium 500 Mg Tabs (Calcium Carbonate) .... Take 2 By Mouth Once Daily 5)  Eq Ibuprofen 200 Mg Caps (Ibuprofen) .... Take 4 By Mouth Once Daily 6)  Vitamin C 500 Mg Tabs (Ascorbic Acid) .... Take 1 By Mouth Once Daily 7)  Bayer Aspirin Ec Low Dose 81 Mg Tbec (Aspirin) .... Take 1 By Mouth Once Daily 8)  Gabapentin 300 Mg Caps (Gabapentin) .... Take 1 By Mouth Once Daily 9)  Metformin Hcl 500 Mg Tabs (Metformin Hcl) .... Take 1 By Mouth Two Times A Day 10)  Vytorin 10-20 Mg Tabs (Ezetimibe-Simvastatin) .... Take 1/2 By Mouth Once Daily 11)  Premarin 0.3 Mg Tabs (Estrogens Conjugated) .... Take 1 By Mouth Once Daily 12)  Proair Hfa 108 (90 Base) Mcg/act Aers (Albuterol Sulfate) .... 2 Puffs Four Times A Day As Needed 13)  Albuterol Sulfate (2.5 Mg/46ml) 0.083% Nebu (Albuterol Sulfate) .Marland Kitchen.. 1 Neb Four Times A Day As Needed 14)  Qvar 80 Mcg/act Aers (Beclomethasone Dipropionate) .... 2 Puffs and Rinse Well, Twice Daily After Symbicort. 15)  Lift Chair .... Having Total Knee Replacement 16)  Oxygen 2l/m .... All The Time 17)  Brovana 15 Mcg/40ml Nebu (Arformoterol Tartrate) .Marland Kitchen.. 1 Vial Two Times A Day  Allergies (verified): No Known Drug Allergies  Past History:  Past Medical History: Last updated: 08/02/2009 Diabetes, Type 2 Bronchitis Hyperlipidemia HTN neuropathy  Past Surgical History: Last updated: 05/01/2009 partial thyroidectomy- remote Tonsillectomy Appendectomy Cholecystectomy Total Abdominal Hysterectomy bladder surgery  Family History: Last updated: 19-Jan-2009 Father and brother died of heart disease- brother was 31 yo. 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  The patient denies anorexia, fever, weight loss, weight gain, vision loss, decreased hearing, hoarseness, chest pain, syncope, peripheral edema, headaches, hemoptysis, abdominal pain, and severe indigestion/heartburn.    Vital Signs:  Patient profile:   77 year old female Height:      61 inches Weight:      139 pounds O2 Sat:      95 % on 2 L/min Pulse rate:   81 / minute BP sitting:   134 / 78  (left arm) Cuff size:   regular  Vitals Entered By: Reynaldo Minium CMA (January 18, 2010 3:01 PM)  O2 Flow:  2 L/min  Physical Exam  Additional Exam:  General: A/Ox3; pleasant and cooperative, NAD, slender elderly woman, wheel chair, supplemental oxygen 2 L/M 95%. SKIN: no rash, lesions NODES: no lymphadenopathy HEENT: [place]/AT, EOM- WNL, Conjuctivae- clear, PERRLA, TM-WNL, Nose- clea, minor scabbing in leftr, Throat- clear and wnl, missing tooth, upper and lower partial plates NECK: Supple w/ fair ROM, JVD- none, normal carotid impulses w/o bruits Thyroid-  CHEST: [place] cough bilaterally with deep breath. Talkative. HEART: RRR, no m/g/r heard ABDOMEN:  ZOX:WRUE, nl pulses, trace edema left foot more than right. NEURO: Grossly intact to observation      Impression & Recommendations:  Problem # 1:  COPD (ICD-496) There may be a chronic bronchitis but I want to see if some of the lung congestion is from fluid load. We will use maxzide to keep it simple for her and allow her to be off oxygen as tolerated at rest during the day.  Problem # 2:  LUNG NODULE (ICD-518.89) We will recheck in a year or so as per the CT scan report.  Medications Added to Medication List This Visit: 1)  Oxygen 2l/m  .... All the time 2)  Brovana 15 Mcg/34ml Nebu (Arformoterol tartrate) .Marland Kitchen.. 1 vial two times a day 3)  Triamterene-hctz 37.5-25 Mg Tabs (Triamterene-hctz) .Marland Kitchen.. 1 daily as diuretic 4)  Brovana 15 Mcg/37ml Nebu (Arformoterol tartrate) .Marland Kitchen.. 1 neb twice daily as  needed  Other Orders: Est. Patient Level III (45409)  Patient Instructions: 1)  Please schedule a follow-up appointment in 1 month. 2)  OK to try being off oxygen as tolerated when you are sitting quietly during the daytime. Please use it when you are up and active, and for sleep. 3)  Script for diuretic/ fluid pill sent to your drug store. 4)  Script for Brovana neb solution or albuterol solution - not both 5)  Continue Qvar 80 2 puffs and rinse mouth, twice every day. Prescriptions: BROVANA 15 MCG/2ML NEBU (ARFORMOTEROL TARTRATE) 1 neb twice daily as needed  #60 x prn   Entered and Authorized by:   Waymon Budge MD   Signed by:   Waymon Budge MD on 01/18/2010   Method used:   Print then Give to Patient   RxID:   [id number] TRIAMTERENE-HCTZ 37.5-25 MG TABS (TRIAMTERENE-HCTZ) 1 daily as diuretic  #30 x 1   Entered and Authorized by:   Waymon Budge MD   Signed by:   Waymon Budge MD on 01/18/2010   Method used:   Electronically to        [facility]* (retail)       [address], [place]  [address]       Ph: [phone]       Fax: [phone]   RxID:   [id number]

## 2010-12-11 NOTE — Assessment & Plan Note (Signed)
Summary: RE-EVAL KNEE/DISCUSS SURG/XRAYS/NEW INS AARP SEC HORIZ/CAF   Visit Type:  Follow-up Referring Provider:  Juanetta Gosling Primary Provider:  Dwana Melena  CC:  right knee pain.  History of Present Illness: I saw Carrie Heath in the office today for a followup visit.  She is a 77 years old woman with the complaint of:  DX: right knee OA.  History of osteoarthritis of the RIGHT knee treated with an injection and some ibuprofen continues to complain of pain and stiffness in the RIGHT knee with weakness in her upper thighs and difficulty getting out of a chair.  She is now actually using a lift chair.  She has a history of bronchitis acute on chronic which is been present for the last 3 years and has been treated but seems to recur.  Although she is scheduled for an evaluation for possible knee replacement her medical condition seems to be inhibiting [place] from doing the surgery.  She is to see Dr. Maple [place] regarding her bronchitis and then I need to see a report regarding that to see if surgery is an option.    Current Medications (verified): 1)  Vitamin E 400 Unit Caps (Vitamin E) .... Take 1 By Mouth Once Daily 2)  Fish Oil 1000 Mg Caps (Omega-3 Fatty Acids) .... Take 3 By Mouth Once Daily 3)  Glucosamine Complex  Tabs (Nutritional Supplements) .... Take 2 By Mouth Once Daily 4)  Calcium 500 Mg Tabs (Calcium Carbonate) .... Take 2 By Mouth Once Daily 5)  Biotin 5000 5 Mg Caps (Biotin) .... Take 1 By Mouth Once Daily 6)  Eq Ibuprofen 200 Mg Caps (Ibuprofen) .... Take 4 By Mouth Once Daily 7)  Vitamin C 500 Mg Tabs (Ascorbic Acid) .... Take 1 By Mouth Once Daily 8)  Bayer Aspirin Ec Low Dose 81 Mg Tbec (Aspirin) .... Take 1 By Mouth Once Daily 9)  Gabapentin 300 Mg Caps (Gabapentin) .... Take 1 By Mouth Once Daily 10)  Metformin Hcl 500 Mg Tabs (Metformin Hcl) .... Take 1 By Mouth Two Times A Day 11)  Vytorin 10-20 Mg Tabs (Ezetimibe-Simvastatin) .... Take 1/2 By Mouth Once Daily 12)  Premarin  0.3 Mg Tabs (Estrogens Conjugated) .... Take 1 By Mouth Once Daily 13)  Symbicort 160-4.5 Mcg/act Aero (Budesonide-Formoterol Fumarate) .... 2 Puffs and Rinse Twice Daily 14)  Proair Hfa 108 (90 Base) Mcg/act Aers (Albuterol Sulfate) .... 2 Puffs Four Times A Day As Needed 15)  Albuterol Sulfate (2.5 Mg/18ml) 0.083% Nebu (Albuterol Sulfate) .Marland Kitchen.. 1 Neb Four Times A Day As Needed 16)  Benzonatate 100 Mg Caps (Benzonatate) .Marland Kitchen.. 1 Four Times A Day As Needed Cough 17)  Qvar 80 Mcg/act Aers (Beclomethasone Dipropionate) .... 2 Puffs and Rinse Well, Twice Daily After Symbicort. 18)  Lift Chair .... Having Total Knee Replacement  Allergies (verified): No Known Drug Allergies  Past History:  Past Medical History: Last updated: 08/02/2009 Diabetes, Type 2 Bronchitis Hyperlipidemia HTN neuropathy  Past Surgical History: Last updated: 05/01/2009 partial thyroidectomy- remote Tonsillectomy Appendectomy Cholecystectomy Total Abdominal Hysterectomy bladder surgery  Family History: Last updated: Feb 11, 2009 Father and brother died of heart disease- brother was 39 yo. 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 Resp:  chronic cough, exacerbation of shortness of breath,.  Impression & Recommendations: 3 views RIGHT knee  There is severe valgus osteoarthritis with loss of joint space in the lateral compartment there are multiple osteophytes there is also patellofemoral disease  Impression osteoarthritis with valgus malalignment RIGHT knee  Other Orders: Est. Patient Level III ([address]) Knee x-ray,  3 views (60454)  Patient Instructions: 1)  patient is advised to see her physician Dr. Maple [place] for preop  evaluation regarding her chronic bronchitis.  We scheduled followup appointment for march first week

## 2010-12-11 NOTE — Miscellaneous (Signed)
Summary: Orders Update pft charges  Clinical Lists Changes  Orders: Added new Service order of Carbon Monoxide diffusing w/capacity (94720) - Signed Added new Service order of Lung Volumes (94240) - Signed Added new Service order of Spirometry (Pre & Post) (94060) - Signed 

## 2010-12-13 NOTE — Assessment & Plan Note (Signed)
Summary: 4 month f/u appt/mg   Copy to:  Juanetta Gosling Primary Provider/Referring Provider:  Timothy Lasso Hall/ [place]  CC:  4 month follow up visit-COPD and Lung nodule; swelling in feet in the mornings and sometime through out the day.Marland Kitchen  History of Present Illness: June 07, 2010- Chronic cough, bronchitis....................family here Acute visit. Staying on oxygen x 3 weeks. Congested and wheezing w/ no fever. Feet swelling. No infection, but tried erythromycin- no help. Clear mucus, no blood or chest pain. She and her family note that she does better on prednisone and ask about low-dose maintenance- discussed.  August 07, 2010- Chronic cough, bronchitis...........................daughter here She feels very much better on prednisone 5 mg daily, since she called asking to try maintenance.. We immediately discussed steroid side effects. Denies wheezing, not waking at night, not coughing. Arthritis slows her more than her breathing. Not using oxygen at all right now, which is a big improvement over last visit..Still has O2 concentrator at home. Not using any of her inhalers now. Has some Brovana in the refrigerator if needed.  December 05, 2010- Chronic cough, bronchitis...........................daughter here Nurse-CC: 4 month follow up visit-COPD, Lung nodule; swelling in feet in the mornings and sometime through out the day. Needed Zpak in December. That helped. She feels much better since we gave prednione taper january 12. She feels this helps. She is quite vague about which inhalers she has and what they are for. We worked through her meds, discussing side effects, especially of steroids and educating daughter, Using neb with [name] about twice daily.     Preventive Screening-Counseling & Management  Alcohol-Tobacco     Smoking Status: quit     Year Started: 1958     Year Quit: 2008  Current Medications (verified): 1)  Vitamin E 400 Unit Caps (Vitamin E) .... Take 1 By Mouth Once  Daily 2)  Fish Oil 1000 Mg Caps (Omega-3 Fatty Acids) .... Take 2 By Mouth Once Daily 3)  Glucosamine-Chondroitin  Tabs (Glucosamine-Chondroit-Vit C-Mn) .... Take 2 By Mouth Once Daily 4)  Calcium 500 Mg Tabs (Calcium Carbonate) .... Take 2 By Mouth Once Daily 5)  Eq Ibuprofen 200 Mg Caps (Ibuprofen) .... Take 4 By Mouth Once Daily 6)  Vitamin C 500 Mg Tabs (Ascorbic Acid) .... Take 1 By Mouth Once Daily 7)  Bayer Aspirin Ec Low Dose 81 Mg Tbec (Aspirin) .... Take 1 By Mouth Once Daily 8)  Gabapentin 300 Mg Caps (Gabapentin) .... Take 2 By Mouth Once Daily 9)  Metformin Hcl 500 Mg Tabs (Metformin Hcl) .... Take 1 By Mouth Two Times A Day 10)  Vytorin 10-20 Mg Tabs (Ezetimibe-Simvastatin) .... Take 1 Every Other Day 11)  Proair Hfa 108 (90 Base) Mcg/act Aers (Albuterol Sulfate) .... 2 Puffs Four Times A Day As Needed 12)  Qvar 80 Mcg/act Aers (Beclomethasone Dipropionate) .... 2 Puffs and Rinse Well, Twice Daily After Symbicort. 13)  Lift Chair .... Having Total Knee Replacement 14)  Oxygen 2l/m .... All The Time 15)  Brovana 15 Mcg/55ml Nebu (Arformoterol Tartrate) .Marland Kitchen.. 1 Neb Twice Daily As Needed 16)  Prednisone 1 Mg Tabs (Prednisone) .... 4 Daily 17)  Zithromax Z-Pak 250 Mg Tabs (Azithromycin) .... Take As Directed 18)  Prednisone 10 Mg Tabs (Prednisone) .... 5 X 2 Days, 4 X 2 Days, 3 X 2 Days, 2 X 2 Days, 1 X 2 Days Then Resume Regular Dose  Allergies (verified): No Known Drug Allergies  Past History:  Past Medical History: Last updated: 02/22/2010 Diabetes, Type  2 Bronchitis/ COPD Hyperlipidemia HTN neuropathy  Past Surgical History: Last updated: 05/01/2009 partial thyroidectomy- remote Tonsillectomy Appendectomy Cholecystectomy Total Abdominal Hysterectomy bladder surgery  Family History: Last updated: 2009/02/11 Father and brother died of heart disease- brother was 53 yo. 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         epistaxis  Vital Signs:  Patient profile:   77 year old female Height:      61 inches Weight:      149.25 pounds BMI:     28.30 O2 Sat:      97 % on Room air Pulse rate:   68 / minute BP sitting:   128 / 78  (left arm) Cuff size:   regular  Vitals Entered By: Reynaldo Minium CMA (December 05, 2010 10:52 AM)  O2 Flow:  Room air CC: 4 month follow up visit-COPD, Lung nodule; swelling in feet in the mornings and sometime through out the day.   Physical Exam  Additional Exam:  General: A/Ox3; pleasant and cooperative, NAD, slender elderly woman, , O2 sat 97% on room air at rest. SKIN: no rash, lesions NODES: no lymphadenopathy HEENT: [place]/AT, EOM- WNL, Conjuctivae- clear, PERRLA, TM-WNL, Nose- clear, Throat- clear and wnl, missing tooth, upper and lower partial plates NECK: Supple w/ fair ROM, JVD- 1 cm, normal carotid impulses w/o bruits Thyroid-  CHEST: clear to P&A HEART: RRR, no m/g/r heard ABDOMEN: soft JWJ:XBJY, nl pulses, trace edema, slightly dusky cool feet NEURO: Grossly intact to observation      Impression & Recommendations:  Problem # 1:  COPD (ICD-496) Recent exacerbation is responding to prednisone.  I don't think at age 16, she will be able to keep track of her medicine unless it is made very simple. She is vague about Qvar and we are going to try replacing it with alternate day prednisone.   Problem # 2:  LUNG NODULE (ICD-518.89)  Low concern. We discussed recent imaging  and will get CXR in the furture  Medications Added to Medication List This Visit: 1)  Fish Oil 1000 Mg Caps (Omega-3 fatty acids) .... Take 2 by mouth once daily 2)  Gabapentin 300 Mg Caps (Gabapentin) .... Take 2 by mouth once daily 3)  Vytorin 10-20 Mg Tabs (Ezetimibe-simvastatin) .... Take 1 every other day 4)  Prednisone 5 Mg Tabs (Prednisone) .Marland Kitchen.. 1 tab every other day- just take on even dates  Other Orders: Est. Patient Level IV (78295)  Patient Instructions: 1)  Please schedule a follow-up appointment in 4 months. 2)  I suggest you and your daughter sit down together and go through your meds at home to see if they match this list. Call us with any questions. 3)  We are stopping the Qvar steroid inhaler. 4)  Instead, try taking  5)  prednisone 5 mg every other day on the even dates. Start this when you finish your current prednisone taper 6)  Try saline nasal gel as needed in your nose to prevent nose bleeds.  Prescriptions: PREDNISONE 5 MG TABS (PREDNISONE) 1  tab every other day- just take on even dates  #50 x 1   Entered and Authorized by:   Waymon Budge MD   Signed by:   Waymon Budge MD on 12/05/2010   Method used:   Print then Give to Patient   RxID:   [id number]

## 2010-12-13 NOTE — Progress Notes (Signed)
Summary: sick - z pak  Phone Note Call from Patient Call back at [facility] Phone [phone]   Caller: Patient Call For: YOUNG Reason for Call: Talk to Nurse Summary of Call: Patient calling saying she has been wheezing since last Wed. thinks she has bronchiits.  Asking for abx.  [facility] Initial call taken by: Lehman Prom,  October 22, 2010 10:29 AM  Follow-up for Phone Call        Called, spoke with pt.  She c/o wheezing - onset Friday.  States she also has a prod cough with light tan phelgm.  Denies increased SOB, chest tightness, f/c/s.  Still on pred 4mg /day.  Requesting CY's recs.    [facility] NKDA  Dr. Maple [place], pls advise.  Thanks! Follow-up by: Gweneth Dimitri RN,  October 22, 2010 12:44 PM  Additional Follow-up for Phone Call Additional follow up Details #1::        Offer Z pak Additional Follow-up by: Waymon Budge MD,  October 22, 2010 1:40 PM    Additional Follow-up for Phone Call Additional follow up Details #2::    Called, spoke with pt.  She was informed to take z pak as directed and aware rx sent to pharm.  She will call back if sxs do not improve or worsen. Follow-up by: Gweneth Dimitri RN,  October 22, 2010 1:48 PM  New/Updated Medications: ZITHROMAX Z-PAK 250 MG TABS (AZITHROMYCIN) take as directed Prescriptions: ZITHROMAX Z-PAK 250 MG TABS (AZITHROMYCIN) take as directed  #1 x 0   Entered by:   Gweneth Dimitri RN   Authorized by:   Waymon Budge MD   Signed by:   Gweneth Dimitri RN on 10/22/2010   Method used:   Electronically to        [facility]* (retail)       [address]       [place], [place]  [address]       Ph: [phone]       Fax: [phone]   RxID:   [id number]

## 2010-12-13 NOTE — Progress Notes (Signed)
Summary: prescription  Phone Note Call from Patient   Caller: Patient Call For: dr. Maple Hudson Reason for Call: Insurance Question Summary of Call: patient phoned she stated that she has bronchitis again and wants to know if she needs a stronger dose of prednisone. She has just finished her antibiotic which hasnt seemed to get her better. She is still wheezing. She also needs prescription for her emergency inhailer. She uses [facility]. She can be reached at [phone]  Initial call taken by: Vedia Coffer,  November 22, 2010 3:56 PM  Follow-up for Phone Call        Spoke with pt.  She c/o increased SOB, wheezing, and prod cough with foamy white sputum x 2 days.  She is requestying pred taper.  She is currently just taking her maint dose of 4 mg daily.  Pls advise thanks NKDA  Follow-up by: Vernie Murders,  November 22, 2010 4:20 PM  Additional Follow-up for Phone Call Additional follow up Details #1::        Per CDY-okay to give Prednisone 10mg  #30 take 5 x 2 days, 4 x 2 days, 3 x 2 days, 2 x 2 days, 1 x 2 days then 4mg  daily as before. no refills.Reynaldo Minium CMA  November 22, 2010 4:24 PM     Additional Follow-up for Phone Call Additional follow up Details #2::    Spoke with pt and notified of recs per CDY. Pt verbalized understanding. Rx was sent to pharm. Follow-up by: Vernie Murders,  November 22, 2010 4:54 PM  New/Updated Medications: PREDNISONE 10 MG TABS (PREDNISONE) 5 x 2 days, 4 x 2 days, 3 x 2 days, 2 x 2 days, 1 x 2 days then resume regular dose Prescriptions: PREDNISONE 10 MG TABS (PREDNISONE) 5 x 2 days, 4 x 2 days, 3 x 2 days, 2 x 2 days, 1 x 2 days then resume regular dose  #30 x 0   Entered by:   Vernie Murders   Authorized by:   Waymon Budge MD   Signed by:   Vernie Murders on 11/22/2010   Method used:   Electronically to        [facility]* (retail)       [address] [place], [place]  [address]       Ph:  [phone]       Fax: [phone]   RxID:   [id number] PROAIR HFA 108 (90 BASE) MCG/ACT AERS (ALBUTEROL SULFATE) 2 puffs four times a day as needed  #1 x 1   Entered by:   Vernie Murders   Authorized by:   Waymon Budge MD   Signed by:   Vernie Murders on 11/22/2010   Method used:   Electronically to        [facility]* (retail)       [address] [place], [place]  [address]       Ph: [phone]       Fax: [phone]   RxID:   [id number]

## 2010-12-17 ENCOUNTER — Telehealth (INDEPENDENT_AMBULATORY_CARE_PROVIDER_SITE_OTHER): Payer: Self-pay | Admitting: *Deleted

## 2010-12-27 NOTE — Progress Notes (Signed)
Summary: feet and hands swelling  Phone Note Call from Patient   Caller: Patient Call For: YOUNG Summary of Call: patient phoned stated she is on 5mg  of prednisone every other day and her feet are swelling and tingling. Her hands are swelling just a little. Patient wants to know if a dieretic would help even though she doesnt have any. Patient can be reached [phone] she uses [facility] Initial call taken by: Vedia Coffer,  December 17, 2010 9:10 AM  Follow-up for Phone Call        Spoke with pt.  She states that she has noticed increased swelling in her feet x 3 days, hands are also slightly swollen.  She states that swelling has increased since prednisone has been started.  She wants to know if needs direuretic.  In the meantime, I advised avoid salt and keep legs elevated. Pls advise thanks!\par NKDA Follow-up by: Vernie Murders,  December 17, 2010 10:02 AM  Additional Follow-up for Phone Call Additional follow up Details #1::        OK to offer maxzide/ triam-Hctz, chosen since it doesn't cause potassium problems. I added to med list- please send.  Additional Follow-up by: Waymon Budge MD,  December 17, 2010 1:22 PM    Additional Follow-up for Phone Call Additional follow up Details #2::    Spoke with pt and notified of the above recs per CDY.  Pt verbalized understanding.  Rx was sent to pharm. Follow-up by: Vernie Murders,  December 17, 2010 2:21 PM  New/Updated Medications: TRIAMTERENE-HCTZ 37.5-25 MG TABS (TRIAMTERENE-HCTZ) 1 daily as needed diuretic Prescriptions: TRIAMTERENE-HCTZ 37.5-25 MG TABS (TRIAMTERENE-HCTZ) 1 daily as needed diuretic  #30 x 5   Entered by:   Vernie Murders   Authorized by:   Waymon Budge MD   Signed by:   Vernie Murders on 12/17/2010   Method used:   Electronically to        [facility] (retail)       [address] [place], [place]  [address]       Ph: [phone]       Fax: [phone]  RxID:   [id number] TRIAMTERENE-HCTZ 37.5-25 MG TABS (TRIAMTERENE-HCTZ) 1 daily as needed diuretic  #30 x 5   Entered by:   Waymon Budge MD   Authorized by:   Pulmonary Triage   Signed by:   Waymon Budge MD on 12/17/2010   Method used:   Historical   RxID:   [id number]

## 2011-01-03 ENCOUNTER — Telehealth (INDEPENDENT_AMBULATORY_CARE_PROVIDER_SITE_OTHER): Payer: Self-pay | Admitting: *Deleted

## 2011-01-08 ENCOUNTER — Telehealth: Payer: Self-pay | Admitting: Internal Medicine

## 2011-01-08 NOTE — Progress Notes (Signed)
Summary: change pred to 5mg  daily.  Phone Note Call from Patient Call back at Home Phone [phone]   Caller: Patient Call For: young Summary of Call: patient phoned she has some questions regarding the dosage of some of her medicines. She has cut back and the dosage and wants to know if she needs to increase them now. this is for her provair, pednisone, and brovana. she can be reached at [phone] Initial call taken by: Vedia Coffer,  January 03, 2011 1:33 PM  Follow-up for Phone Call        called and spoke with pt.  pt states when she last saw CY he had recommended her to stop the proair and just use brovana as she needed.  pt states she had had more sob and productive cough with cream colored sputum and therefore went back to taking brovana two times a day and proair two times a day.  pt states she is also on prednisone 5mg  every other day.  pt states she will be going to [place] in April for a wedding and doesn't want to still be sick.  pt wanted to know if she can take pred 5 mg daily instead to help "her through this spell."  please advise.  thanks.  Aundra Millet Reynolds LPN  January 03, 2011 2:58 PM  NKDA  Additional Follow-up for Phone Call Additional follow up Details #1::        Per CDY-yes she can take 5mg  daily instead to help her through the spell.Reynaldo Minium CMA  January 03, 2011 4:18 PM     Additional Follow-up for Phone Call Additional follow up Details #2::    called and spoke with pt.  pt aware of CY's recs to take Prednisone 5mg  daily.  nothing further needed. Arman Filter LPN  January 03, 2011 5:07 PM   New/Updated Medications: PREDNISONE 5 MG TABS (PREDNISONE) Take 1 tablet by mouth once a day

## 2011-01-17 NOTE — Progress Notes (Signed)
Summary: order for portable concentrator  Phone Note Call from Patient Call back at Home Phone [phone]   Caller: Patient Call For: young Reason for Call: Talk to Nurse Summary of Call: Patient calling saying she is going on a trip and is requesting a portable concentrator.  She says she only uses o2 when she needs it.  [facility] Initial call taken by: Lehman Prom,  January 08, 2011 2:26 PM  Follow-up for Phone Call        Dr Maple Hudson, pls advise if okay to send order for portable o2. thanks Follow-up by: Vernie Murders,  January 08, 2011 4:05 PM  Additional Follow-up for Phone Call Additional follow up Details #1::        I have put order for portable concentrator on med list - ok to send to her DME.  Additional Follow-up by: Waymon Budge MD,  January 08, 2011 8:23 PM    Additional Follow-up for Phone Call Additional follow up Details #2::    order sent. Carron Curie CMA  January 09, 2011 10:24 AM   New/Updated Medications: * PORTABLE OXYGEN CONCENTRATOR 2 L/M As directed

## 2011-02-06 ENCOUNTER — Other Ambulatory Visit: Payer: Self-pay | Admitting: *Deleted

## 2011-02-06 MED ORDER — ARFORMOTEROL TARTRATE 15 MCG/2ML IN NEBU: 15.0000 ug | INHALATION_SOLUTION | Freq: Two times a day (BID) | RESPIRATORY_TRACT | Status: DC

## 2011-03-26 ENCOUNTER — Other Ambulatory Visit: Payer: Self-pay | Admitting: *Deleted

## 2011-03-26 MED ORDER — PREDNISONE 5 MG PO TABS: 5.0000 mg | ORAL_TABLET | Freq: Every day | ORAL | Status: AC

## 2011-03-27 ENCOUNTER — Encounter: Payer: Self-pay | Admitting: Internal Medicine

## 2011-03-29 NOTE — Procedures (Signed)
[name], Carrie Heath              ACCOUNT NO.:  [id number]   MEDICAL RECORD NO.:  [id number]          PATIENT TYPE:  OUT   LOCATION:  RESP                          FACILITY:  [facility]   PHYSICIAN:  Edward L. Juanetta Gosling, M.D.DATE OF BIRTH:  1921/08/07   DATE OF PROCEDURE:  DATE OF DISCHARGE:                            PULMONARY FUNCTION TEST   1. Spirometry shows no ventilatory defect, but does show some evidence      of airflow obstruction.  DLCO is normal.  2. She could not do lung volume determinations.  3. There is no evidence of airflow with no bronchodilator improvement.      Edward L. Juanetta Gosling, M.D.  Electronically Signed     EL[facility]/MEDQ  D:  11/18/2007  T:  11/18/2007  Job:  [id number]   cc:   Catalina Pizza, M.D.  Fax: [phone]

## 2011-04-02 ENCOUNTER — Encounter: Payer: Self-pay | Admitting: Internal Medicine

## 2011-04-02 ENCOUNTER — Ambulatory Visit (INDEPENDENT_AMBULATORY_CARE_PROVIDER_SITE_OTHER): Payer: Medicare Other | Admitting: Internal Medicine

## 2011-04-02 VITALS — BP 116/72 | HR 77 | Ht 61.0 in | Wt 154.8 lb

## 2011-04-02 DIAGNOSIS — J449 Chronic obstructive pulmonary disease, unspecified: Secondary | ICD-10-CM

## 2011-04-02 NOTE — Progress Notes (Signed)
  Subjective:    Patient ID: Carrie Heath, female    DOB: [birth year], 77 y.o.   MRN: [id number]  HPI 04/02/11- 43 yoF followed for COPD and lung nodule, complicated by DM Last here December 05, 2010- note reviewed.  Here with son today, she complains mostly about arthritis pains. She hasn't needed prednisone in past week. Her breathing hasn't missed it. Hasn't needed O2 in a month or two from [place]. Not needing her nebulizer machine.   Review of Systems Constitutional:   No weight loss, night sweats,  Fevers, chills, fatigue, lassitude. HEENT:   No headaches,  Difficulty swallowing,  Tooth/dental problems,  Sore throat,                No sneezing, itching, ear ache, nasal congestion, post nasal drip,   CV:  No chest pain,  Orthopnea, PND, swelling in lower extremities, anasarca, dizziness, palpitations  GI  No heartburn, indigestion, abdominal pain, nausea, vomiting, diarrhea, change in bowel habits, loss of appetite  Resp: No shortness of breath with exertion or at rest.  No excess mucus, no productive cough,  No non-productive cough,  No coughing up of blood.  No change in color of mucus.  No wheezing.  Skin: no rash or lesions.  GU: no dysuria, change in color of urine, no urgency or frequency.  No flank pain.  MS:  No decreased range of motion.  No back pain.  Psych:  No change in mood or affect. No depression or anxiety.  No memory loss.     Objective:   Physical Exam General- Alert, Oriented, Affect-appropriate, Distress- none acute  Skin- rash-none, lesions- none, excoriation- none  Lymphadenopathy- none  Head- atraumatic  Eyes- Gross vision intact, PERRLA, conjunctivae clear secretions  Ears- Hearing, canals, Tm - normal for age  Nose- Clear, No-Septal dev, mucus, polyps, erosion, perforation   Throat- Mallampati II , mucosa clear , drainage- none, tonsils- atrophic  Neck- flexible , trachea midline, no stridor , thyroid nl, carotid no  bruit  Chest - symmetrical excursion , unlabored     Heart/CV- RRR , no murmur , no gallop  , no rub, nl s1 s2                     - JVD- none , edema- none, stasis changes- none, varices- none     Lung- clear to P&A, distant ,            wheeze- none, cough- none , dullness-none, rub- none     Chest wall-   Abd- tender-no, distended-no, bowel sounds-present, HSM- no  Br/ Gen/ Rectal- Not done, not indicated  Extrem- feet a little dusky       , clubbing, none, atrophy- none, strength- nl  Neuro- grossly intact to observation         Assessment & Plan:

## 2011-04-02 NOTE — Assessment & Plan Note (Addendum)
She is currently improved and stable. We discussed prednisone and will leave her with some on hand in reserve, but try to stay off.  We will turn in her O2 for now.

## 2011-04-02 NOTE — Patient Instructions (Signed)
Hold the prednisone in reserve for now in case needed. I hope we can stay off awhile.  Order- We will have [facility] contact [facility] to d/c home oxygen.

## 2011-04-25 ENCOUNTER — Encounter: Payer: Self-pay | Admitting: Orthopedic Surgery

## 2011-04-25 ENCOUNTER — Ambulatory Visit (INDEPENDENT_AMBULATORY_CARE_PROVIDER_SITE_OTHER): Payer: Medicare Other | Admitting: Orthopedic Surgery

## 2011-04-25 DIAGNOSIS — M25551 Pain in right hip: Secondary | ICD-10-CM

## 2011-04-25 DIAGNOSIS — M6281 Muscle weakness (generalized): Secondary | ICD-10-CM

## 2011-04-25 DIAGNOSIS — M25559 Pain in unspecified hip: Secondary | ICD-10-CM

## 2011-04-25 DIAGNOSIS — M4800 Spinal stenosis, site unspecified: Secondary | ICD-10-CM

## 2011-04-25 DIAGNOSIS — R29898 Other symptoms and signs involving the musculoskeletal system: Secondary | ICD-10-CM | POA: Insufficient documentation

## 2011-04-25 NOTE — Patient Instructions (Signed)
See back specialist   Start physical therapy

## 2011-04-25 NOTE — Progress Notes (Signed)
X-ray report  AP pelvis 3 views L. Spine  Standard AP pelvic x-ray shows normal hip joints and no fracture  Impression normal pelvic x-ray  3 views lumbar spine Obvious scoliosis is noted.  She is joint space narrowing of all discs in the lumbar sacral area.  There is evidence of kyphosis at T11-T12 junction.  Impression degenerative scoliosis with spinal stenosis

## 2011-04-25 NOTE — Progress Notes (Signed)
Est. Patient  Exacerbation of previous problem  Bilateral leg pain bilateral leg weakness history of osteoarthritis RIGHT knee  Patient comes in today complaining of severe pain in the lower back and radiating down both legs associated with weakness especially when getting out of a chair.  She has osteoarthritis of the RIGHT knee.  She treated with injection still has some of those symptoms as well.  Review of systems she denies any serious bowel or bladder dysfunction her saddle anesthesia, she has had no significant weight loss fevers night chills.  The examination reveals an elderly female ambulating with a cane.  She is awake alert and oriented x3 mood and affect are normal cardiovascular exam shows she has no significant vertical sprains normal color temperature to both lower extremities.  Her legs have normal skin without rash or caf au lait spots.  Neurologically she is normal reflexes in the knee with equal but 0/4 reflexes at the ankles.  Gross motor strength is intact and normal muscle tone.  She has normal hip flexion bilaterally with no pain.  X-rays of the L. Spine and pelvis were obtained  The hip joints look good.  The lumbar spine shows degenerative scoliosis with 5 level disc disease and mild kyphosis at T11-T12 junction  Impression spinal stenosis Impression osteoarthritis RIGHT knee  Plan recommend starting physical therapy to strengthen her legs and work on her lumbar area for stabilization  Based on her age is probably not a surgical candidate although I think she should be followed by a neurosurgeon and a general orthopaedist.  She may need chronic pain management which can be handled by her medical physician.

## 2011-04-30 ENCOUNTER — Telehealth: Payer: Self-pay | Admitting: Internal Medicine

## 2011-04-30 NOTE — Telephone Encounter (Signed)
Spoke with patient and she states [employer] loss [employer]er just delivered brovana and proair to her home so nothing further needed. Carron Curie, CMA

## 2011-05-03 ENCOUNTER — Telehealth: Payer: Self-pay | Admitting: *Deleted

## 2011-05-03 NOTE — Telephone Encounter (Signed)
Faxed order to [phone] per patients request for pt

## 2011-05-03 NOTE — Telephone Encounter (Signed)
Patient is going to get PT somewhere other than [facility], I faxed over order to the fax number she gave me.

## 2011-05-07 ENCOUNTER — Ambulatory Visit (HOSPITAL_COMMUNITY): Payer: Medicare Other | Admitting: Physical Therapy

## 2011-05-10 ENCOUNTER — Telehealth: Payer: Self-pay | Admitting: Internal Medicine

## 2011-05-10 DIAGNOSIS — J449 Chronic obstructive pulmonary disease, unspecified: Secondary | ICD-10-CM

## 2011-05-10 NOTE — Telephone Encounter (Signed)
Spoke with the patient and she is requesting to have an order sent to [facility] [employer] so she can get a couple of oxygen tanks to take with her to [place]. She states her children are concerned she may need it and not have any. I called [facility] to see what needed to be done. They state since the pt needs the tanks today then she will have to be self pay and she will need to pick them up at store today. I advised the pt of all of the above and she still wants the tanks. Please advise if ok to place order.Carron Curie, CMA

## 2011-05-10 NOTE — Telephone Encounter (Signed)
Per Dr. Maple Hudson ok to send order. Order placed and faxed to [facility]. Pt aware. Carron Curie, CMA

## 2011-06-17 ENCOUNTER — Telehealth: Payer: Self-pay | Admitting: Internal Medicine

## 2011-06-17 MED ORDER — PREDNISONE 1 MG PO TABS: ORAL_TABLET | ORAL | Status: DC

## 2011-06-17 MED ORDER — ARFORMOTEROL TARTRATE 15 MCG/2ML IN NEBU: 15.0000 ug | INHALATION_SOLUTION | Freq: Two times a day (BID) | RESPIRATORY_TRACT | Status: DC

## 2011-06-17 NOTE — Telephone Encounter (Signed)
Spoke with pt's daughter and advised rxs have been sent to pharm.

## 2011-06-17 NOTE — Telephone Encounter (Signed)
lmtcb--give pt or daughter following info when she calls back--Sent both rx's to [facility], per dr young the prednisone is for reserve only and try not to use this unless absolutely necessary.   Pt aware of above msg per leslie

## 2011-06-27 ENCOUNTER — Telehealth: Payer: Self-pay | Admitting: Internal Medicine

## 2011-06-27 MED ORDER — PREDNISONE 5 MG PO TABS: 5.0000 mg | ORAL_TABLET | Freq: Every day | ORAL | Status: AC

## 2011-06-27 NOTE — Telephone Encounter (Signed)
OK to increase prednisone back to 5 mg daily

## 2011-06-27 NOTE — Telephone Encounter (Signed)
Pt's daughter in-law returning call.[name]

## 2011-06-27 NOTE — Telephone Encounter (Signed)
[id number] 

## 2011-06-27 NOTE — Telephone Encounter (Signed)
Called, spoke with Carrie Heath.  She is aware CDY is ok for pt to go back to 5mg  prednisone qd.  She is requesting a rx for this - [facility].  Rx sent and [facility] aware.

## 2011-06-27 NOTE — Telephone Encounter (Signed)
Pt's daughter-in-law says that the pt is coughing more and the mucus production is worse since her Prednisone was cut to 2.5 mg daily. She says the pt will be leaving and going back to her own home tomorrow and she would like to have the Prednisone increased back to 5 mg daily until seen in September. Pls advise.No Known Allergies

## 2011-07-29 ENCOUNTER — Ambulatory Visit (INDEPENDENT_AMBULATORY_CARE_PROVIDER_SITE_OTHER): Payer: Medicare Other | Admitting: Internal Medicine

## 2011-07-29 ENCOUNTER — Encounter: Payer: Self-pay | Admitting: Internal Medicine

## 2011-07-29 VITALS — BP 114/68 | HR 65 | Ht 61.0 in | Wt 147.8 lb

## 2011-07-29 DIAGNOSIS — J984 Other disorders of lung: Secondary | ICD-10-CM

## 2011-07-29 DIAGNOSIS — J449 Chronic obstructive pulmonary disease, unspecified: Secondary | ICD-10-CM

## 2011-07-29 NOTE — Assessment & Plan Note (Addendum)
No nodule seen on CXR of July 2011.  As long as nothing grows enough to be seen on chest x-ray, at this point, at 77 years old, we're comfortable not repeating CT scan.

## 2011-07-29 NOTE — Progress Notes (Signed)
Subjective:     HPI Review of Systems     Objective:   Physical Exam    Assessment & Plan:   Subjective:    Patient ID: Carrie Heath, female    DOB: March 11, 1921, 77 y.o.   MRN: [id number]  HPI 04/02/11- 40 yoF followed for COPD and lung nodule, complicated by DM Last here December 05, 2010- note reviewed.  Here with son today, she complains mostly about arthritis pains. She hasn't needed prednisone in past week. Her breathing hasn't missed it. Hasn't needed O2 in a month or two from [place]. Not needing her nebulizer machine.   07/30/11- 89 yoF followed for COPD and lung nodule, complicated by DM    Daughter here Fairly stable. Children encourage her to continue her [name] and she takes 1/2 of a 1 mg prednisone tab. Tried off this summer and remembers restarting it because of cough and wheeze. She is vague about whether she has prednisone 1 vs 5 mg pills.  Still notes some wheeze.  CXR 05/30/2010- stable mild COPD, NAD, no nodules seen  Review of Systems Constitutional:   No weight loss, night sweats,  Fevers, chills, fatigue, lassitude. HEENT:   No headaches,  Difficulty swallowing,  Tooth/dental problems,  Sore throat,                No sneezing, itching, ear ache, nasal congestion, post nasal drip,  CV:  No chest pain,  Orthopnea, PND, swelling in lower extremities, anasarca, dizziness, palpitations GI  No heartburn, indigestion, abdominal pain, nausea, vomiting, diarrhea, change in bowel habits, loss of appetite Resp: No shortness of breath with exertion or at rest.  No excess mucus, no productive cough,  No non-productive cough,  No coughing up of blood.  No change in color of mucus.  No wheezing.  Skin: no rash or lesions. GU: no dysuria, change in color of urine, no urgency or frequency.  No flank pain. MS:  No decreased range of motion.  No back pain. Psych:  No change in mood or affect. No depression or anxiety.  No memory loss.     Objective:  General-  Alert, Oriented, Affect-appropriate, Distress- none acute Skin- rash-none, lesions- none, excoriation- none Lymphadenopathy- none Head- atraumatic            Eyes- Gross vision intact, PERRLA, conjunctivae clear secretions            Ears- Hearing, canals normal for age            Nose- Clear, no-Septal dev, mucus, polyps, erosion, perforation             Throat- Mallampati II , mucosa clear , drainage- none, tonsils- atrophic Neck- flexible , trachea midline, no stridor , thyroid nl, carotid no bruit Chest - symmetrical excursion , unlabored           Heart/CV- RRR , no murmur , no gallop  , no rub, nl s1 s2                           - JVD- none , edema- none, stasis changes- none, varices- none           Lung- clear to P&A, wheeze- none, cough- none , dullness-none, rub- none           Chest wall-  Abd- tender-no, distended-no, bowel sounds-present, HSM- no Br/ Gen/ Rectal- Not done, not indicated Extrem- cyanosis- none, clubbing, none, atrophy- none,  strength- nl Neuro- grossly intact to observation         Assessment & Plan:

## 2011-07-29 NOTE — Patient Instructions (Signed)
Reduce prednisone to 1/2 tab (5 mg) every other day- try marking a kitchen calender.   We have most confidence in the HEPA type air filters,

## 2011-07-29 NOTE — Assessment & Plan Note (Addendum)
Mild chronic lung disease. Her age is now a more significant limiting factor. I think she has probably been taking 1/2 x 5 mg prednisone. I will have her try reducing that to every other day.

## 2011-08-06 ENCOUNTER — Ambulatory Visit: Payer: Medicare Other | Admitting: Internal Medicine

## 2011-10-09 ENCOUNTER — Other Ambulatory Visit: Payer: Self-pay | Admitting: Internal Medicine

## 2011-10-09 MED ORDER — PREDNISONE 1 MG PO TABS: ORAL_TABLET | ORAL | Status: DC

## 2011-10-11 ENCOUNTER — Other Ambulatory Visit: Payer: Self-pay | Admitting: Internal Medicine

## 2011-10-25 ENCOUNTER — Other Ambulatory Visit: Payer: Self-pay | Admitting: Allergy

## 2011-10-30 ENCOUNTER — Telehealth: Payer: Self-pay | Admitting: Allergy

## 2011-10-30 NOTE — Telephone Encounter (Signed)
[facility] [employer] Faxed over stating pt is on 5mg  prednisone not 1mg  . Per last ov she was to take 1/2 of 5mg  qod. Dr Maple Hudson is this ok to fill.  No Known Allergies

## 2011-10-30 NOTE — Telephone Encounter (Signed)
Ok to refill prednisone 5 mg, # 30- 1/2 tab daily, refill till next visit

## 2011-10-31 MED ORDER — PREDNISONE 5 MG PO TABS: ORAL_TABLET | ORAL | Status: DC

## 2011-10-31 NOTE — Telephone Encounter (Signed)
Rx has been sent  

## 2011-11-25 ENCOUNTER — Other Ambulatory Visit: Payer: Self-pay | Admitting: Internal Medicine

## 2012-01-07 ENCOUNTER — Other Ambulatory Visit: Payer: Self-pay | Admitting: Internal Medicine

## 2012-01-27 ENCOUNTER — Ambulatory Visit (INDEPENDENT_AMBULATORY_CARE_PROVIDER_SITE_OTHER): Payer: Medicare Other | Admitting: Internal Medicine

## 2012-01-27 ENCOUNTER — Encounter: Payer: Self-pay | Admitting: Internal Medicine

## 2012-01-27 ENCOUNTER — Ambulatory Visit (INDEPENDENT_AMBULATORY_CARE_PROVIDER_SITE_OTHER)
Admission: RE | Admit: 2012-01-27 | Discharge: 2012-01-27 | Disposition: A | Discharge status: Home / Self Care | Payer: Medicare Other | Source: Ambulatory Visit | Attending: Internal Medicine | Admitting: Internal Medicine

## 2012-01-27 VITALS — BP 112/74 | HR 83 | Ht 64.0 in | Wt 152.2 lb

## 2012-01-27 DIAGNOSIS — J449 Chronic obstructive pulmonary disease, unspecified: Secondary | ICD-10-CM

## 2012-01-27 DIAGNOSIS — J984 Other disorders of lung: Secondary | ICD-10-CM

## 2012-01-27 DIAGNOSIS — J42 Unspecified chronic bronchitis: Secondary | ICD-10-CM

## 2012-01-27 MED ORDER — FLUTTER DEVI: Status: DC

## 2012-01-27 MED ORDER — ALBUTEROL SULFATE HFA 108 (90 BASE) MCG/ACT IN AERS: 2.0000 | INHALATION_SPRAY | RESPIRATORY_TRACT | Status: DC | PRN

## 2012-01-27 MED ORDER — PREDNISONE 1 MG PO TABS: 3.0000 mg | ORAL_TABLET | Freq: Every day | ORAL | Status: DC

## 2012-01-27 NOTE — Patient Instructions (Signed)
Scripts for Prednisone and Proair to carry on your trip  Suggest Mucinex as an expectorant  Order- script for Flutter device to help clear you airways---      Blow through 4 times, and do this 3 times daily when needed  Order- CXR-   Dx COPD, lung nodule

## 2012-01-27 NOTE — Progress Notes (Signed)
Patient ID: Carrie Heath, female    DOB: 05/29/1921, 78 y.o.   MRN: [id number]  HPI 04/02/11- 60 yoF followed for COPD and lung nodule, complicated by DM Last here December 05, 2010- note reviewed.  Here with son today, she complains mostly about arthritis pains. She hasn't needed prednisone in past week. Her breathing hasn't missed it. Hasn't needed O2 in a month or two from [place]. Not needing her nebulizer machine.   07/30/11- 89 yoF followed for COPD and lung nodule, complicated by DM    Daughter here Fairly stable. Children encourage her to continue her [name] and she takes 1/2 of a 1 mg prednisone tab. Tried off this summer and remembers restarting it because of cough and wheeze. She is vague about whether she has prednisone 1 vs 5 mg pills.  Still notes some wheeze.  CXR 05/30/2010- stable mild COPD, NAD, no nodules seen  01/27/12- 89 yoF followed for COPD and lung nodule, complicated by DM   Son here FOLLOWS FOR: reports still having some SOB occasionally, chest congestion and prod cough with foamy clear/cream colored mucus - but reports has not used the oxygen at home.  not using brovana daily Doesn't hear herself wheeze. Uses her nebulizer with Brovana about once a day. Uses pro air off-and-on, 0 to twice daily. Room air cleaner. Turned her oxygen back in because she wasn't using it. Daily prednisone now at 3 mg. Mostly notices exertional dyspnea when up for ADLs like bathroom. Wants print prescriptions for her medications, planning 4-6 week trip to [place].  Review of Systems-see HPI Constitutional:   No weight loss, night sweats,  Fevers, chills, fatigue, lassitude. HEENT:   No headaches,  Difficulty swallowing,  Tooth/dental problems,  Sore throat,                No sneezing, itching, ear ache, nasal congestion, post nasal drip,  CV:  No chest pain, orthopnea, PND, swelling in lower extremities, anasarca, dizziness, palpitations GI  No heartburn, indigestion,  abdominal pain, nausea, vomiting,  Resp: + shortness of breath with exertion or at rest.  No excess mucus, no productive cough,  No non-productive cough,  No coughing up of blood.  No change in color of mucus.  Occasional wheezing.  Skin: no rash or lesions. GU: no dysuria, . MS: No acute pain. Psych:  No change in mood or affect. No depression or anxiety.  No memory loss.     Objective:  General- Alert, Oriented, Affect-appropriate, Distress- none acute Skin- rash-none, lesions- none, excoriation- none Lymphadenopathy- none Head- atraumatic            Eyes- Gross vision intact, PERRLA, conjunctivae clear secretions            Ears- Hearing, canals normal for age            Nose- Clear, no-Septal dev, mucus, polyps, erosion, perforation             Throat- Mallampati II , mucosa clear , drainage- none, tonsils- atrophic Neck- flexible , trachea midline, no stridor , thyroid nl, carotid no bruit Chest - symmetrical excursion , unlabored           Heart/CV- RRR , no murmur , no gallop  , no rub, nl s1 s2                           - JVD- none , edema- none, stasis changes- none, varices-  none           Lung- musical I&E wheeze, cough- none , dullness-none, rub- none           Chest wall-  Abd-  Br/ Gen/ Rectal- Not done, not indicated Extrem- cyanosis- none, clubbing, none, atrophy- none, strength- nl Neuro- grossly intact to observation

## 2012-01-29 NOTE — Assessment & Plan Note (Signed)
Plan-update chest x-ray 

## 2012-01-29 NOTE — Assessment & Plan Note (Signed)
Dyspnea is limiting but not prohibitive. Oxygen saturation is 96% on room air today, supporting her lack of home oxygen. We discussed medications, anticipating her trip to [place]. Plan-try to improve mucus clearance with Mucinex and a Flutter device.

## 2012-02-11 ENCOUNTER — Encounter: Payer: Self-pay | Admitting: Cardiology

## 2012-02-17 ENCOUNTER — Ambulatory Visit (INDEPENDENT_AMBULATORY_CARE_PROVIDER_SITE_OTHER): Payer: Medicare Other | Admitting: Cardiology

## 2012-02-17 ENCOUNTER — Encounter: Payer: Self-pay | Admitting: Cardiology

## 2012-02-17 VITALS — BP 140/83 | HR 82 | Resp 16 | Ht 62.0 in | Wt 150.0 lb

## 2012-02-17 DIAGNOSIS — I4891 Unspecified atrial fibrillation: Secondary | ICD-10-CM

## 2012-02-17 MED ORDER — DABIGATRAN ETEXILATE MESYLATE 75 MG PO CAPS: 75.0000 mg | ORAL_CAPSULE | Freq: Two times a day (BID) | ORAL | Status: DC

## 2012-02-17 NOTE — Progress Notes (Signed)
HPI I have been asked to consult on Carrie Heath, by Dr. Margo Aye, for the evaluation of new onset A. fib.  She is a 78 years old but looks much younger and is quite independent. She lives alone. She has fallen only one time over year ago when her knee gave way coming down some steps. She denies any presyncope or syncope.  She has no symptoms of clinical A. fib including palpitations, chest pain, or significant shortness of breath. She does have some mild dyspnea on exertion which is chronic.  She has no history of stroke. She has been on aspirin but has an occasional nosebleed. His cultures stop her aspirin one time. She denies any other bleeding diathesis and has had no melena.  She is diabetic. There is no history of hypertension. There's no history of heart failure.    Past Medical History  Diagnosis Date  . DM type 2 (diabetes mellitus, type 2)   . Bronchitis   . COPD (chronic obstructive pulmonary disease)   . Hyperlipidemia   . HTN (hypertension)   . Neuropathy     Current Outpatient Prescriptions  Medication Sig Dispense Refill  . albuterol (PROAIR HFA) 108 (90 BASE) MCG/ACT inhaler Inhale 2 puffs into the lungs every 4 (four) hours as needed for wheezing or shortness of breath.  8.5 each  3  . arformoterol (BROVANA) 15 MCG/2ML NEBU Take 2 mLs (15 mcg total) by nebulization 2 (two) times daily.  120 mL  11  . aspirin 81 MG tablet Take 81 mg by mouth daily.       . Calcium Carbonate (CALCIUM 500 PO) Take 1 tablet by mouth daily.       . calcium citrate-vitamin D (CITRACAL+D) 315-200 MG-UNIT per tablet Take 1 tablet by mouth 2 (two) times daily.      . Cimetidine (ACID REDUCER PO) Take by mouth as needed.      . gabapentin (NEURONTIN) 300 MG capsule Take 600 mg by mouth 3 (three) times daily. 3 capsules by mouth three times daily      . Glucosamine-Chondroit-Vit C-Mn (GLUCOSAMINE CHONDROITIN COMPLX) CAPS Take 1 capsule by mouth 2 (two) times daily.       Marland Kitchen ibuprofen  (ADVIL,MOTRIN) 200 MG tablet 2 tabs by mouth twice daily, 1 at bedtime as needed      . metFORMIN (GLUCOPHAGE) 500 MG tablet Take 500 mg by mouth 2 (two) times daily with a meal.        . Multiple Vitamins-Minerals (MULTI COMPLETE PO) Take by mouth daily.      . Omega-3 Fatty Acids (FISH OIL) 1000 MG CAPS Take 1 capsule by mouth daily.       Marland Kitchen PAPAYA PO Take by mouth daily.      . predniSONE (DELTASONE) 5 MG tablet       . Pseudoephedrine-Guaifenesin (MUCUS RELIEF D PO) Take by mouth as needed.      Marland Kitchen Respiratory Therapy Supplies (FLUTTER) DEVI Blow through 4 times, and do this 3 times daily when needed  1 each  0  . vitamin C (ASCORBIC ACID) 500 MG tablet Take 500 mg by mouth daily.        . vitamin E 400 UNIT capsule Take 400 Units by mouth daily.          No Known Allergies  Family History  Problem Relation Age of Onset  . Heart disease Father   . Heart disease Brother     CABG  . Alzheimer's disease  Mother     History   Social History  . Marital Status: Widowed    Spouse Name: N/A    Number of Children: 2  . Years of Education: N/A   Occupational History  . retired    Social History Main Topics  . Smoking status: Former Smoker    Quit date: 11/11/2006  . Smokeless tobacco: Not on file  . Alcohol Use: Not on file  . Drug Use: Not on file  . Sexually Active: Not on file   Other Topics Concern  . 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    ROS ALL NEGATIVE EXCEPT THOSE NOTED IN HPI  PE  General Appearance: well developed, well nourished in no acute distress HEENT: symmetrical face, PERRLA,   Neck: no JVD, thyromegaly, or adenopathy, trachea midline Chest: symmetric without deformity Cardiac: PMI non-displaced, regular rate and rhythm, normal S1, S2, no gallop or murmur Lung: clear to ausculation and percussion Vascular: all pulses full without bruits  Abdominal: nondistended, nontender,  good bowel sounds, no HSM, no bruits Extremities: no cyanosis, clubbing or edema, no sign of DVT, no varicosities  Skin: normal color, no rashes Neuro: alert and oriented x 3, non-focal Pysch: normal affect  EKG Atrial fibrillation with a well-controlled ventricular rate, left anterior fascicular block, poor R-wave progression anterior precordium BMET    Component Value Date/Time   CREATININE 1.13 11/15/2008 1107   GFRNONAA 46* 11/15/2008 1107   GFRAA  Value: 55        The eGFR has been calculated using the MDRD equation. This calculation has not been validated in all clinical situations. eGFR's persistently <60 mL/min signify possible Chronic Kidney Disease.* 11/15/2008 1107    Lipid Panel  No results found for this basename: chol, trig, hdl, cholhdl, vldl, ldlcalc    CBC No results found for this basename: wbc, rbc, hgb, hct, plt, mcv, mch, mchc, rdw, neutrabs, lymphsabs, monoabs, eosabs, basosabs

## 2012-02-17 NOTE — Assessment & Plan Note (Signed)
She is asymptomatic except for mild dyspnea on exertion which seems to be chronic and slowly progressive. She does have chronic lung disease.  Her risk of thromboembolic stroke is high considering her age and diabetes. I've advised anticoagulation and stopping her aspirin. I discussed this with the family and they would like her to go on Pradaxa. We'll have the anticoagulation clinic calculate the dose. Her family has already  determined that she will only have to pay $45 a month for the drug. I made her aware of the increased bleeding risk which she is willing to accept. I'll will arrange for  an echocardiogram in the near future. I'll see her back in about 8 weeks.

## 2012-02-17 NOTE — Patient Instructions (Signed)
**Note De-Identified  Obfuscation** Your physician has recommended you make the following change in your medication: stop taking Aspirin and start taking Pradaxa 75 mg twice daily  Your physician has requested that you have an echocardiogram. Echocardiography is a painless test that uses sound waves to create images of your heart. It provides your doctor with information about the size and shape of your heart and how well your heart's chambers and valves are working. This procedure takes approximately one hour. There are no restrictions for this procedure.  Your physician recommends that you schedule a follow-up appointment in: 8 to 10 weeks

## 2012-02-18 ENCOUNTER — Other Ambulatory Visit: Payer: Self-pay

## 2012-02-26 ENCOUNTER — Telehealth: Payer: Self-pay

## 2012-02-26 NOTE — Telephone Encounter (Signed)
**Note De-Identified  Obfuscation** Pt. has received prior authorization through her insurance company, [employer], for Pradaxa until 02/25/13./LV

## 2012-02-27 ENCOUNTER — Other Ambulatory Visit: Payer: Self-pay | Admitting: *Deleted

## 2012-02-27 MED ORDER — DABIGATRAN ETEXILATE MESYLATE 75 MG PO CAPS: 75.0000 mg | ORAL_CAPSULE | Freq: Two times a day (BID) | ORAL | Status: DC

## 2012-03-17 ENCOUNTER — Telehealth: Payer: Self-pay | Admitting: Cardiology

## 2012-03-17 MED ORDER — DABIGATRAN ETEXILATE MESYLATE 75 MG PO CAPS: 75.0000 mg | ORAL_CAPSULE | Freq: Two times a day (BID) | ORAL | Status: DC

## 2012-03-17 NOTE — Telephone Encounter (Signed)
PT NEEDS PRADAXA 75MG  TWO A DAY CALLED IN TO [facility] [place] AT [phone].

## 2012-03-17 NOTE — Telephone Encounter (Signed)
**Note De-Identified  Obfuscation** RX called into [facility] in [place] at [phone] for Pradaxa 75 mg to be taken BID. #180 with no refills./LV

## 2012-03-24 ENCOUNTER — Other Ambulatory Visit (HOSPITAL_COMMUNITY): Payer: Medicare Other

## 2012-03-30 ENCOUNTER — Other Ambulatory Visit: Payer: Self-pay | Admitting: Internal Medicine

## 2012-03-30 NOTE — Telephone Encounter (Signed)
Please advise if ok to refill, thanks 

## 2012-03-31 NOTE — Telephone Encounter (Signed)
Ok to refill 

## 2012-04-02 ENCOUNTER — Telehealth: Payer: Self-pay | Admitting: Internal Medicine

## 2012-04-02 NOTE — Telephone Encounter (Signed)
lmomtcb x1 

## 2012-04-03 ENCOUNTER — Ambulatory Visit (HOSPITAL_COMMUNITY)
Admission: RE | Admit: 2012-04-03 | Discharge: 2012-04-03 | Disposition: A | Discharge status: Home / Self Care | Payer: Medicare Other | Source: Ambulatory Visit | Attending: Cardiology | Admitting: Cardiology

## 2012-04-03 DIAGNOSIS — J4489 Other specified chronic obstructive pulmonary disease: Secondary | ICD-10-CM | POA: Insufficient documentation

## 2012-04-03 DIAGNOSIS — I4891 Unspecified atrial fibrillation: Secondary | ICD-10-CM | POA: Insufficient documentation

## 2012-04-03 DIAGNOSIS — I369 Nonrheumatic tricuspid valve disorder, unspecified: Secondary | ICD-10-CM

## 2012-04-03 DIAGNOSIS — E785 Hyperlipidemia, unspecified: Secondary | ICD-10-CM | POA: Insufficient documentation

## 2012-04-03 DIAGNOSIS — J449 Chronic obstructive pulmonary disease, unspecified: Secondary | ICD-10-CM | POA: Insufficient documentation

## 2012-04-03 DIAGNOSIS — E119 Type 2 diabetes mellitus without complications: Secondary | ICD-10-CM | POA: Insufficient documentation

## 2012-04-03 NOTE — Progress Notes (Signed)
*  PRELIMINARY RESULTS* Echocardiogram 2D Echocardiogram has been performed.  Carrie Heath 04/03/2012, 1:02 PM

## 2012-04-03 NOTE — Telephone Encounter (Signed)
lmomtcb x 2  

## 2012-04-07 NOTE — Telephone Encounter (Signed)
Called and asked to speak with Carrie Heath. Pt states she is out of town. Pt states that she has plenty of her neb meds with refills and does not need neb tubing or supplies at this time. She states "to my knowledge I don't need anything for now". Will close encounter.

## 2012-04-15 ENCOUNTER — Ambulatory Visit (INDEPENDENT_AMBULATORY_CARE_PROVIDER_SITE_OTHER): Payer: Medicare Other | Admitting: Cardiology

## 2012-04-15 ENCOUNTER — Encounter: Payer: Self-pay | Admitting: Cardiology

## 2012-04-15 VITALS — BP 131/79 | HR 89 | Resp 18 | Ht 62.0 in | Wt 150.0 lb

## 2012-04-15 DIAGNOSIS — I4891 Unspecified atrial fibrillation: Secondary | ICD-10-CM

## 2012-04-15 NOTE — Patient Instructions (Signed)
**Note De-identified  Obfuscation** Your physician recommends that you continue on your current medications as directed. Please refer to the Current Medication list given to you today.  Your physician recommends that you schedule a follow-up appointment in: 1 year  

## 2012-04-15 NOTE — Progress Notes (Signed)
HPI [name] comes in today for followup of her asymptomatic A. fib with a well-controlled ventricular rate. She remains totally asymptomatic. Her trip to [place] was uneventful. Echocardiogram showed a patent foramen ovale otherwise good LV function with no significant valvular disease. She is on Pradexa. No melena or bleeding except for bruising.  Past Medical History  Diagnosis Date  . DM type 2 (diabetes mellitus, type 2)   . Bronchitis   . COPD (chronic obstructive pulmonary disease)   . Hyperlipidemia   . HTN (hypertension)   . Neuropathy     Current Outpatient Prescriptions  Medication Sig Dispense Refill  . albuterol (PROAIR HFA) 108 (90 BASE) MCG/ACT inhaler Inhale 2 puffs into the lungs every 4 (four) hours as needed for wheezing or shortness of breath.  8.5 each  3  . arformoterol (BROVANA) 15 MCG/2ML NEBU Take 2 mLs (15 mcg total) by nebulization 2 (two) times daily.  120 mL  11  . calcium citrate-vitamin D (CITRACAL+D) 315-200 MG-UNIT per tablet Take 1 tablet by mouth 2 (two) times daily.      . Cimetidine (ACID REDUCER PO) Take by mouth as needed.      . dabigatran (PRADAXA) 75 MG CAPS Take 1 capsule (75 mg total) by mouth every 12 (twelve) hours.  180 capsule  0  . gabapentin (NEURONTIN) 300 MG capsule Take 600 mg by mouth 3 (three) times daily.       . Glucosamine-Chondroit-Vit C-Mn (GLUCOSAMINE CHONDROITIN COMPLX) CAPS Take 1 capsule by mouth 2 (two) times daily.       Marland Kitchen ibuprofen (ADVIL,MOTRIN) 200 MG tablet 2 tabs by mouth twice daily, 1 at bedtime as needed      . metFORMIN (GLUCOPHAGE) 500 MG tablet Take 500 mg by mouth 2 (two) times daily with a meal.        . Multiple Vitamins-Minerals (MULTI COMPLETE PO) Take by mouth daily.      . Omega-3 Fatty Acids (FISH OIL) 1000 MG CAPS Take 1 capsule by mouth daily.       Marland Kitchen PAPAYA PO Take by mouth daily.      . predniSONE (DELTASONE) 1 MG tablet TAKE DAILY OR AS DIRECTED  90 tablet  0  . predniSONE (DELTASONE) 5 MG  tablet       . Pseudoephedrine-Guaifenesin (MUCUS RELIEF D PO) Take by mouth as needed.      Marland Kitchen Respiratory Therapy Supplies (FLUTTER) DEVI Blow through 4 times, and do this 3 times daily when needed  1 each  0  . vitamin C (ASCORBIC ACID) 500 MG tablet Take 500 mg by mouth daily.        . vitamin E 400 UNIT capsule Take 400 Units by mouth daily.          No Known Allergies  Family History  Problem Relation Age of Onset  . Heart disease Father   . Heart disease Brother     CABG  . Alzheimer's disease Mother     History   Social History  . Marital Status: Widowed    Spouse Name: N/A    Number of Children: 2  . Years of Education: N/A   Occupational History  . retired    Social History Main Topics  . Smoking status: Former Smoker    Quit date: 11/11/2006  . Smokeless tobacco: Not on file  . Alcohol Use: Not on file  . Drug Use: Not on file  . Sexually Active: Not on file   Other  Topics Concern  . 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    ROS ALL NEGATIVE EXCEPT THOSE NOTED IN HPI  PE  General Appearance: well developed, well nourished in no acute distress HEENT: symmetrical face, PERRLA, good dentition  Neck: no JVD, thyromegaly, or adenopathy, trachea midline Chest: symmetric without deformity Cardiac: PMI non-displaced, irregular rate and rhythm, normal S1, S2, no gallop or murmur Lung: clear to ausculation and percussion Vascular: all pulses full without bruits  Abdominal: nondistended, nontender, good bowel sounds, no HSM, no bruits Extremities: no cyanosis, clubbing or edema, no sign of DVT, no varicosities  Skin: normal color, no rashes, few bruises particularly of her elbows. Neuro: alert and oriented x 3, non-focal Pysch: normal affect  EKG  BMET    Component Value Date/Time   CREATININE 1.13 11/15/2008 1107   GFRNONAA 46* 11/15/2008 1107   GFRAA  Value: 55        The eGFR has  been calculated using the MDRD equation. This calculation has not been validated in all clinical situations. eGFR's persistently <60 mL/min signify possible Chronic Kidney Disease.* 11/15/2008 1107    Lipid Panel  No results found for this basename: chol, trig, hdl, cholhdl, vldl, ldlcalc    CBC No results found for this basename: wbc, rbc, hgb, hct, plt, mcv, mch, mchc, rdw, neutrabs, lymphsabs, monoabs, eosabs, basosabs

## 2012-04-15 NOTE — Assessment & Plan Note (Signed)
She is totally asymptomatic with good rate control and on anticoagulation. Echo findings reviewed with her her son. We will see back in one year.

## 2012-05-25 ENCOUNTER — Other Ambulatory Visit: Payer: Self-pay | Admitting: Cardiology

## 2012-05-26 MED ORDER — DABIGATRAN ETEXILATE MESYLATE 75 MG PO CAPS: 75.0000 mg | ORAL_CAPSULE | Freq: Two times a day (BID) | ORAL | Status: DC

## 2012-06-22 ENCOUNTER — Telehealth: Payer: Self-pay | Admitting: Internal Medicine

## 2012-06-22 MED ORDER — ARFORMOTEROL TARTRATE 15 MCG/2ML IN NEBU: 15.0000 ug | INHALATION_SOLUTION | Freq: Two times a day (BID) | RESPIRATORY_TRACT | Status: DC

## 2012-06-22 NOTE — Telephone Encounter (Signed)
LMOM TCB x1 for Carrie Heath.  There are two prednisone on pt's med list.  Need to verify how pt is taking >> per last ov, should be 1mg  once daily.  Last ov 3.18.13, follow up in 6 months >> 9.16.13

## 2012-06-22 NOTE — Telephone Encounter (Signed)
Called spoke with Phylis who verified that pt takes between 1mg  and 2.5mg  daily and will rarely go up to 5mg  prn wheezing/SOB.  Advised Phylis that since the prednisone is on pt's med list twice (a predisone 1mg  take 1 daily or as directed and a prednisone 5mg  with no instruction) will need to get CY's approval on which strength to fill.  Phylis ok with this and asked that if she does not answer to please leave a message when this is taken care of.  Brovana sent to [facility] - last filled 8.6.12. Prednisone 1mg  take 1 daily or as directed last filled 5.20.13 #90 with 0 refills. Last ov with CY 3.18.13, follow up in 6 months >> 9.16.13.  Dr Maple Hudson please advise, thanks.

## 2012-06-22 NOTE — Telephone Encounter (Signed)
Carrie Heath returned call. She says pt takes "2 prednisone tabs per day most of the time". Says dr told her she could take 5 per day on days that she needs this the most". Carrie Heath

## 2012-06-23 ENCOUNTER — Other Ambulatory Visit: Payer: Self-pay | Admitting: Internal Medicine

## 2012-06-23 MED ORDER — ARFORMOTEROL TARTRATE 15 MCG/2ML IN NEBU: 15.0000 ug | INHALATION_SOLUTION | Freq: Two times a day (BID) | RESPIRATORY_TRACT | Status: DC

## 2012-06-23 MED ORDER — PREDNISONE 1 MG PO TABS: ORAL_TABLET | ORAL | Status: DC

## 2012-06-23 NOTE — Telephone Encounter (Signed)
LMTCB

## 2012-06-23 NOTE — Telephone Encounter (Signed)
Phyllis retuned call. Carrie Heath

## 2012-06-23 NOTE — Telephone Encounter (Signed)
D/C the prednisone 5 mg  Ok to refill the prednisone 1 mg, # 50, to take 1-5 tabs daily as directed. Ref x 3

## 2012-06-23 NOTE — Telephone Encounter (Signed)
Called, spoke with Carrie Heath.  Informed her of below per Dr. Maple Hudson.  She verbalized understanding of this and requesting rx for 90 days instead of 30 days because it will be the same price.    Spoke with Dr. Maple Hudson.  He is ok with giving rx for prednisone 1 mg # 150 with no refills.    Called Carrie Heath back.  Informed her of above.  She verbalized understanding of this and is requesting brovana to be changed to 3 mo.  Advised this would be done.  Nothing further needed at this time.  Note:  Med list changed to reflect prednisone changes.  Brovana rx changed from 1 mo to 3 mo with Carrie Heath at [facility] who verbalized understanding change.

## 2012-07-27 ENCOUNTER — Ambulatory Visit: Payer: Medicare Other | Admitting: Internal Medicine

## 2012-09-14 ENCOUNTER — Ambulatory Visit (INDEPENDENT_AMBULATORY_CARE_PROVIDER_SITE_OTHER): Payer: Medicare Other | Admitting: Internal Medicine

## 2012-09-14 ENCOUNTER — Encounter: Payer: Self-pay | Admitting: Internal Medicine

## 2012-09-14 VITALS — BP 124/74 | HR 89 | Ht 64.0 in | Wt 152.4 lb

## 2012-09-14 DIAGNOSIS — J42 Unspecified chronic bronchitis: Secondary | ICD-10-CM

## 2012-09-14 NOTE — Patient Instructions (Addendum)
Ok to continue your present treatments  Please call as needed

## 2012-09-14 NOTE — Progress Notes (Signed)
Patient ID: Carrie Heath, female    DOB: 09-07-1921, 78 y.o.   MRN: [id number]  HPI 04/02/11- 33 yoF followed for COPD and lung nodule, complicated by DM Last here December 05, 2010- note reviewed.  Here with son today, she complains mostly about arthritis pains. She hasn't needed prednisone in past week. Her breathing hasn't missed it. Hasn't needed O2 in a month or two from [place]. Not needing her nebulizer machine.   07/30/11- 89 yoF followed for COPD and lung nodule, complicated by DM    Daughter here Fairly stable. Children encourage her to continue her [name] and she takes 1/2 of a 1 mg prednisone tab. Tried off this summer and remembers restarting it because of cough and wheeze. She is vague about whether she has prednisone 1 vs 5 mg pills.  Still notes some wheeze.  CXR 05/30/2010- stable mild COPD, NAD, no nodules seen  01/27/12- 89 yoF followed for COPD and lung nodule, complicated by DM   Son here FOLLOWS FOR: reports still having some SOB occasionally, chest congestion and prod cough with foamy clear/cream colored mucus - but reports has not used the oxygen at home.  not using brovana daily Doesn't hear herself wheeze. Uses her nebulizer with Brovana about once a day. Uses pro air off-and-on, 0 to twice daily. Room air cleaner. Turned her oxygen back in because she wasn't using it. Daily prednisone now at 3 mg. Mostly notices exertional dyspnea when up for ADLs like bathroom. Wants print prescriptions for her medications, planning 4-6 week trip to [place].  09/14/12- 89 yoF followed for COPD and lung nodule, complicated by DM, AFib                  Son here SOB and wheezing at times; cough mostly when congested and before/after Brovana tx. Has had flu vaccine. Has seen Dr. Daleen Squibb for management of atrial fibrillation. Continues prednisone 2.5 mg daily for COPD. Without that, she gets wheeze and chest congestion. Using neb brovana once or twice daily with rare need for  rescue inhaler. No longer has oxygen at home. COPD assessment test (CAT) score 21/40  Review of Systems-see HPI Constitutional:   No weight loss, night sweats,  Fevers, chills, fatigue, lassitude. HEENT:   No headaches,  Difficulty swallowing,  Tooth/dental problems,  Sore throat,                No sneezing, itching, ear ache, nasal congestion, post nasal drip,  CV:  No chest pain, orthopnea, PND, swelling in lower extremities, anasarca, dizziness, +palpitations GI  No heartburn, indigestion, abdominal pain, nausea, vomiting,  Resp: + shortness of breath with exertion or at rest.  No excess mucus, no productive cough,  No non-productive cough,  No coughing up of blood.  No change in color of mucus.  Occasional wheezing.  Skin: no rash or lesions. GU: no dysuria, . MS: No acute pain. Psych:  No change in mood or affect. No depression or anxiety.  No memory loss.     Objective:  General- Alert, Oriented, Affect-appropriate, Distress- none acute Skin- rash-none, lesions- none, excoriation- none Lymphadenopathy- none Head- atraumatic            Eyes- Gross vision intact, PERRLA, conjunctivae clear secretions            Ears- Hearing, canals normal for age            Nose- Clear, no-Septal dev, mucus, polyps, erosion, perforation  Throat- Mallampati II , mucosa clear , drainage- none, tonsils- atrophic Neck- flexible , trachea midline, no stridor , thyroid nl, carotid no bruit Chest - symmetrical excursion , unlabored           Heart/CV- + nearly regular , no murmur , no gallop  , no rub, nl s1 s2                           - JVD- none , edema- none, stasis changes- none, varices- none           Lung- + wet wheeze, cough- none , dullness-none, rub- none           Chest wall-  Abd-  Br/ Gen/ Rectal- Not done, not indicated Extrem- cyanosis- none, clubbing, none, atrophy- none, strength- nl Neuro- grossly intact to observation

## 2012-09-25 NOTE — Assessment & Plan Note (Signed)
Chronic bronchitis, dependent on low-dose maintenance steroid and long-acting bronchodilator. She has persistent wheeze but she feels well.

## 2012-11-24 ENCOUNTER — Telehealth: Payer: Self-pay | Admitting: Internal Medicine

## 2012-11-24 MED ORDER — ARFORMOTEROL TARTRATE 15 MCG/2ML IN NEBU: 15.0000 ug | INHALATION_SOLUTION | Freq: Two times a day (BID) | RESPIRATORY_TRACT | Status: DC

## 2012-11-24 NOTE — Telephone Encounter (Signed)
Rx mailed as requested.

## 2012-11-24 NOTE — Telephone Encounter (Signed)
Verified with pt. She is needing her brovana mailed to her home address (verified with pt). 90 day supply. i have printed off rx. Advised pt once CDY signs RX will mail out to her. She voiced her understanding and needed nothing further.

## 2012-11-27 ENCOUNTER — Telehealth: Payer: Self-pay | Admitting: Internal Medicine

## 2012-11-27 ENCOUNTER — Other Ambulatory Visit: Payer: Self-pay | Admitting: Internal Medicine

## 2012-11-27 NOTE — Telephone Encounter (Signed)
Offer Ipratropium neb solution - I think that's 0.02%- # 25, 1 every 6 hours if needed, refill prn

## 2012-11-27 NOTE — Telephone Encounter (Signed)
Spoke with pt's daughter in law She states that they are unable to find any pharmacy in town that will fill the brovana due to cost of med She wants to know if there is another neb med that comes in generic that would be suitable for this pt who has A-Fib\ ? Ipratropium or Xopenex?  Last ov 09/14/12 Next ov 03/15/13 No Known Allergies

## 2012-11-30 ENCOUNTER — Telehealth: Payer: Self-pay | Admitting: Internal Medicine

## 2012-11-30 MED ORDER — IPRATROPIUM BROMIDE 0.02 % IN SOLN: 500.0000 ug | Freq: Four times a day (QID) | RESPIRATORY_TRACT | Status: DC | PRN

## 2012-11-30 NOTE — Telephone Encounter (Signed)
lmomtcb for Carrie Heath  

## 2012-11-30 NOTE — Telephone Encounter (Signed)
I spoke with Judeth Cornfield and they did not receive e-prescribe sent over from earlier. Gave VO for this. Nothing further was needed. I made pt aware of this. She voiced her understanding.

## 2012-11-30 NOTE — Telephone Encounter (Signed)
Carrie Heath is aware of CDY recs. RX has been sent to the pharmacy. Nothing further was needed

## 2012-12-01 MED ORDER — IPRATROPIUM BROMIDE 0.02 % IN SOLN: 500.0000 ug | Freq: Four times a day (QID) | RESPIRATORY_TRACT | Status: DC | PRN

## 2012-12-01 NOTE — Addendum Note (Signed)
Addended by: Reynaldo Minium C on: 12/01/2012 12:04 PM   Modules accepted: Orders

## 2012-12-02 ENCOUNTER — Telehealth: Payer: Self-pay | Admitting: Cardiology

## 2012-12-02 MED ORDER — DABIGATRAN ETEXILATE MESYLATE 75 MG PO CAPS: 75.0000 mg | ORAL_CAPSULE | Freq: Two times a day (BID) | ORAL | Status: DC

## 2012-12-02 NOTE — Telephone Encounter (Signed)
rx sent to pharmacy by e-script  

## 2012-12-02 NOTE — Telephone Encounter (Signed)
pradaxa refill [facility]

## 2013-02-03 ENCOUNTER — Encounter: Payer: Self-pay | Admitting: Cardiology

## 2013-02-03 ENCOUNTER — Ambulatory Visit (INDEPENDENT_AMBULATORY_CARE_PROVIDER_SITE_OTHER): Payer: Self-pay | Admitting: Cardiology

## 2013-02-03 ENCOUNTER — Ambulatory Visit: Payer: Medicare Other | Admitting: Cardiology

## 2013-02-03 VITALS — BP 148/88 | HR 102 | Ht 64.0 in | Wt 148.0 lb

## 2013-02-03 DIAGNOSIS — I4891 Unspecified atrial fibrillation: Secondary | ICD-10-CM

## 2013-02-03 DIAGNOSIS — E785 Hyperlipidemia, unspecified: Secondary | ICD-10-CM

## 2013-02-03 NOTE — Progress Notes (Signed)
HPI Carrie Heath comes in today for her history of chronic A. fib and a patent foramen ovale. She remains on Pradaxa. She is totally asymptomatic. She is walking with a cane but has not fallen. She remains very independent. She denies any chest discomfort, presyncope or syncope.  Past Medical History  Diagnosis Date  . DM type 2 (diabetes mellitus, type 2)   . Bronchitis   . COPD (chronic obstructive pulmonary disease)   . Hyperlipidemia   . HTN (hypertension)   . Neuropathy     Current Outpatient Prescriptions  Medication Sig Dispense Refill  . arformoterol (BROVANA) 15 MCG/2ML NEBU Take 2 mLs (15 mcg total) by nebulization 2 (two) times daily.  360 mL  3  . calcium citrate-vitamin D (CITRACAL+D) 315-200 MG-UNIT per tablet Take 1 tablet by mouth 2 (two) times daily.      . Cimetidine (ACID REDUCER PO) Take by mouth as needed.      . dabigatran (PRADAXA) 75 MG CAPS Take 1 capsule (75 mg total) by mouth every 12 (twelve) hours.  180 capsule  1  . gabapentin (NEURONTIN) 300 MG capsule Take 600 mg by mouth 3 (three) times daily.       . Glucosamine-Chondroit-Vit C-Mn (GLUCOSAMINE CHONDROITIN COMPLX) CAPS Take 1 capsule by mouth 2 (two) times daily.       Marland Kitchen ibuprofen (ADVIL,MOTRIN) 200 MG tablet 2 tabs by mouth twice daily, 1 at bedtime as needed      . ipratropium (ATROVENT) 0.02 % nebulizer solution Take 2.5 mLs (500 mcg total) by nebulization every 6 (six) hours as needed. DX 496  75 mL  prn  . metFORMIN (GLUCOPHAGE) 500 MG tablet Take 500 mg by mouth 2 (two) times daily with a meal.        . Multiple Vitamins-Minerals (MULTI COMPLETE PO) Take by mouth daily.      . Omega-3 Fatty Acids (FISH OIL) 1000 MG CAPS Take 1 capsule by mouth daily.       Marland Kitchen PAPAYA PO Take by mouth daily.      . predniSONE (DELTASONE) 1 MG tablet take 1-5 tabs daily as directed  150 tablet  0  . Pseudoephedrine-Guaifenesin (MUCUS RELIEF D PO) Take by mouth as needed.      Marland Kitchen Respiratory Therapy Supplies (FLUTTER)  DEVI Blow through 4 times, and do this 3 times daily when needed  1 each  0  . vitamin C (ASCORBIC ACID) 500 MG tablet Take 500 mg by mouth daily.        . vitamin E 400 UNIT capsule Take 400 Units by mouth daily.         No current facility-administered medications for this visit.    No Known Allergies  Family History  Problem Relation Age of Onset  . Heart disease Father   . Heart disease Brother     CABG  . Alzheimer's disease Mother     History   Social History  . Marital Status: Widowed    Spouse Name: N/A    Number of Children: 2  . Years of Education: N/A   Occupational History  . retired    Social History Main Topics  . Smoking status: Former Smoker    Quit date: 11/11/2006  . Smokeless tobacco: Not on file  . Alcohol Use: Not on file  . Drug Use: Not on file  . Sexually Active: Not on file   Other Topics Concern  . 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   Lives alone    ROS ALL NEGATIVE EXCEPT THOSE NOTED IN HPI  PE  General Appearance: well developed, well nourished in no acute distress HEENT: symmetrical face, PERRLA, good dentition  Neck: no JVD, thyromegaly, or adenopathy, trachea midline Chest: symmetric without deformity Cardiac: PMI non-displaced, irregular rate and rhythm normal S1, S2, no gallop or murmur Lung: clear to ausculation and percussion Vascular: all pulses full without bruits  Abdominal: nondistended, nontender, good bowel sounds, no HSM, no bruits Extremities: no cyanosis, clubbing or edema, no sign of DVT, no varicosities  Skin: normal color, no rashes Neuro: alert and oriented x 3, non-focal Pysch: normal affect  EKG Chronic atrial fibrillation with a rate of about 80-90. Poor R wave progression in the anterior precordium. No acute change.  BMET    Component Value Date/Time   CREATININE 1.13 11/15/2008 1107   GFRNONAA 46* 11/15/2008 1107   GFRAA  Value: 55         The eGFR has been calculated using the MDRD equation. This calculation has not been validated in all clinical situations. eGFR's persistently <60 mL/min signify possible Chronic Kidney Disease.* 11/15/2008 1107    Lipid Panel  No results found for this basename: chol, trig, hdl, cholhdl, vldl, ldlcalc    CBC No results found for this basename: wbc, rbc, hgb, hct, plt, mcv, mch, mchc, rdw, neutrabs, lymphsabs, monoabs, eosabs, basosabs

## 2013-02-03 NOTE — Assessment & Plan Note (Signed)
Asymptomatic and stable. Heart rate running about 80 beats per minute. Continue anticoagulation. Return in one year.

## 2013-02-03 NOTE — Patient Instructions (Addendum)
Your physician recommends that you schedule a follow-up appointment in: ONE YEAR 

## 2013-02-16 ENCOUNTER — Other Ambulatory Visit: Payer: Self-pay | Admitting: Internal Medicine

## 2013-02-17 NOTE — Telephone Encounter (Signed)
Please advise if okay to refill this RX; I noticed patient has Dx of Afib in her chart. Thanks.

## 2013-02-17 NOTE — Telephone Encounter (Signed)
Ok to refill [employer]

## 2013-02-18 ENCOUNTER — Other Ambulatory Visit: Payer: Self-pay | Admitting: Internal Medicine

## 2013-02-19 NOTE — Telephone Encounter (Signed)
Ok to refill 

## 2013-02-19 NOTE — Telephone Encounter (Signed)
Please advise if okay to refill; I do not see this dose Rx on her med list. Thanks.

## 2013-03-15 ENCOUNTER — Encounter: Payer: Self-pay | Admitting: Internal Medicine

## 2013-03-15 ENCOUNTER — Ambulatory Visit (INDEPENDENT_AMBULATORY_CARE_PROVIDER_SITE_OTHER): Payer: Medicare Other | Admitting: Internal Medicine

## 2013-03-15 VITALS — BP 124/78 | HR 87 | Ht 59.0 in | Wt 147.8 lb

## 2013-03-15 DIAGNOSIS — J984 Other disorders of lung: Secondary | ICD-10-CM

## 2013-03-15 DIAGNOSIS — J45909 Unspecified asthma, uncomplicated: Secondary | ICD-10-CM

## 2013-03-15 MED ORDER — ALBUTEROL SULFATE HFA 108 (90 BASE) MCG/ACT IN AERS: 2.0000 | INHALATION_SPRAY | RESPIRATORY_TRACT | Status: DC | PRN

## 2013-03-15 MED ORDER — ARFORMOTEROL TARTRATE 15 MCG/2ML IN NEBU: 15.0000 ug | INHALATION_SOLUTION | Freq: Two times a day (BID) | RESPIRATORY_TRACT | Status: DC

## 2013-03-15 MED ORDER — IPRATROPIUM BROMIDE 0.02 % IN SOLN: 500.0000 ug | Freq: Four times a day (QID) | RESPIRATORY_TRACT | Status: DC

## 2013-03-15 MED ORDER — PREDNISONE 1 MG PO TABS: ORAL_TABLET | ORAL | Status: DC

## 2013-03-15 NOTE — Patient Instructions (Addendum)
Meds refilled  Please call as needed 

## 2013-03-15 NOTE — Progress Notes (Signed)
Patient ID: Carrie Heath, female    DOB: [birth year], 79 y.o.   MRN: [id number]  HPI 04/02/11- 39 yoF followed for COPD and lung nodule, complicated by DM Last here December 05, 2010- note reviewed.  Here with son today, she complains mostly about arthritis pains. She hasn't needed prednisone in past week. Her breathing hasn't missed it. Hasn't needed O2 in a month or two from [place]. Not needing her nebulizer machine.   07/30/11- 89 yoF followed for COPD and lung nodule, complicated by DM    Daughter here Fairly stable. Children encourage her to continue her [name] and she takes 1/2 of a 1 mg prednisone tab. Tried off this summer and remembers restarting it because of cough and wheeze. She is vague about whether she has prednisone 1 vs 5 mg pills.  Still notes some wheeze.  CXR 05/30/2010- stable mild COPD, NAD, no nodules seen  01/27/12- 89 yoF followed for COPD and lung nodule, complicated by DM   Son here FOLLOWS FOR: reports still having some SOB occasionally, chest congestion and prod cough with foamy clear/cream colored mucus - but reports has not used the oxygen at home.  not using brovana daily Doesn't hear herself wheeze. Uses her nebulizer with Brovana about once a day. Uses pro air off-and-on, 0 to twice daily. Room air cleaner. Turned her oxygen back in because she wasn't using it. Daily prednisone now at 3 mg. Mostly notices exertional dyspnea when up for ADLs like bathroom. Wants print prescriptions for her medications, planning 4-6 week trip to [place].  09/14/12- 89 yoF followed for COPD and lung nodule, complicated by DM, AFib                  Son here SOB and wheezing at times; cough mostly when congested and before/after Brovana tx. Has had flu vaccine. Has seen Dr. Daleen Squibb for management of atrial fibrillation. Continues prednisone 2.5 mg daily for COPD. Without that, she gets wheeze and chest congestion. Using neb brovana once or twice daily with rare need for  rescue inhaler. No longer has oxygen at home. COPD assessment test (CAT) score 21/40  03/14/13- 89 yoF followed for COPD and lung nodule, complicated by DM, AFib                  Son here FOLLOWS FOR: not able to get brovana neb rx; will need ALL rx's refilled today as she is going out of state.  Son is here [name] is too expensive. We discussed her medications and especially her nebulizer meds. She is going to [place] again this summer. Knees limit her walking. Little cough or phlegm now. CXR 01/27/13 IMPRESSION:  Chronic bronchitic changes.  Original Report Authenticated By: Lollie Marrow, M.D.  Review of Systems-see HPI Constitutional:   No weight loss, night sweats,  Fevers, chills, fatigue, lassitude. HEENT:   No headaches,  Difficulty swallowing,  Tooth/dental problems,  Sore throat,                No sneezing, itching, ear ache, nasal congestion, post nasal drip,  CV:  No chest pain, orthopnea, PND, swelling in lower extremities, anasarca, dizziness, +palpitations GI  No heartburn, indigestion, abdominal pain, nausea, vomiting,  Resp: + shortness of breath with exertion or at rest.  No excess mucus, +little productive cough,  No non-productive cough,  No coughing up of blood.  No change in color of mucus.  Occasional wheezing.  Skin: no rash or lesions. GU:  no dysuria, . MS: No acute pain. Psych:  No change in mood or affect. No depression or anxiety.  No memory loss.     Objective:  General- Alert, Oriented, Affect-appropriate, Distress- none acute. Wheelchair Skin- rash-none, lesions- none, excoriation- none Lymphadenopathy- none Head- atraumatic            Eyes- Gross vision intact, PERRLA, conjunctivae clear secretions            Ears- Hearing, canals normal for age            Nose- Clear, no-Septal dev, mucus, polyps, erosion, perforation             Throat- Mallampati II , mucosa clear , drainage- none, tonsils- atrophic Neck- flexible , trachea midline, no stridor ,  thyroid nl, carotid no bruit Chest - symmetrical excursion , unlabored           Heart/CV- +ireg-reg/ Afib , no murmur , no gallop  , no rub, nl s1 s2                           - JVD- none , edema- none, stasis changes- none, varices- none           Lung- + mild wheeze, cough- none , dullness-none, rub- none           Chest wall-  Abd-  Br/ Gen/ Rectal- Not done, not indicated Extrem- cyanosis- none, clubbing, none, atrophy- none, strength- nl Neuro- grossly intact to observation

## 2013-03-22 ENCOUNTER — Telehealth: Payer: Self-pay | Admitting: Internal Medicine

## 2013-03-22 NOTE — Telephone Encounter (Signed)
Spoke with pts daughter in law pt is unable to afford the brovona and is using atrovent neb  3-4 times a day but states family  Can still here pt wheezing like a train. Stated that areo flow told them that proformist may be a covered medicine.  Also stated that pt was on spiriva a while back and that seemed to help. I didn't see this in med list. Would like something  Comparable to brovona.  Please call this to [facility] [place] [phone] pt is spending time up there for the next 6 weeks... Dr Maple Hudson please advise Thank you

## 2013-03-22 NOTE — Telephone Encounter (Signed)
Offer Rx Perforomist 20 mcg/ 2ml  # 60     1 neb twice daily  Ref prn

## 2013-03-23 ENCOUNTER — Telehealth: Payer: Self-pay | Admitting: Internal Medicine

## 2013-03-23 MED ORDER — FORMOTEROL FUMARATE 20 MCG/2ML IN NEBU: 20.0000 ug | INHALATION_SOLUTION | Freq: Two times a day (BID) | RESPIRATORY_TRACT | Status: DC

## 2013-03-23 NOTE — Telephone Encounter (Signed)
Spoke with patients daughter She states the perforomist is not covered either (see phone note from 03/22/13) Jamesetta So is requesting spiriva rx be sent to pharmacy --patient is not currently on this medication , per Jamesetta So at one time she was and never got the Rx refilled when she was living in [place] Dr. Maple Hudson please advise, thank you  No Known Allergies  Current Outpatient Prescriptions on File Prior to Visit  Medication Sig Dispense Refill  . albuterol (PROAIR HFA) 108 (90 BASE) MCG/ACT inhaler Inhale 2 puffs into the lungs every 4 (four) hours as needed for wheezing or shortness of breath.  2 Inhaler  6  . arformoterol (BROVANA) 15 MCG/2ML NEBU Take 2 mLs (15 mcg total) by nebulization 2 (two) times daily.  360 mL  3  . calcium citrate-vitamin D (CITRACAL+D) 315-200 MG-UNIT per tablet Take 1 tablet by mouth 2 (two) times daily.      . Cimetidine (ACID REDUCER PO) Take by mouth as needed.      . dabigatran (PRADAXA) 75 MG CAPS Take 1 capsule (75 mg total) by mouth every 12 (twelve) hours.  180 capsule  1  . formoterol (PERFOROMIST) 20 MCG/2ML nebulizer solution Take 2 mLs (20 mcg total) by nebulization 2 (two) times daily.  60 mL  prn  . gabapentin (NEURONTIN) 300 MG capsule Take 600 mg by mouth 3 (three) times daily.       . Glucosamine-Chondroit-Vit C-Mn (GLUCOSAMINE CHONDROITIN COMPLX) CAPS Take 1 capsule by mouth 2 (two) times daily.       Marland Kitchen ibuprofen (ADVIL,MOTRIN) 200 MG tablet 2 tabs by mouth twice daily, 1 at bedtime as needed      . ipratropium (ATROVENT) 0.02 % nebulizer solution Take 2.5 mLs (500 mcg total) by nebulization 4 (four) times daily. DX 496  150 mL  6  . metFORMIN (GLUCOPHAGE) 500 MG tablet Take 500 mg by mouth 2 (two) times daily with a meal.        . Multiple Vitamins-Minerals (MULTI COMPLETE PO) Take by mouth daily.      . Omega-3 Fatty Acids (FISH OIL) 1000 MG CAPS Take 1 capsule by mouth daily.       Marland Kitchen PAPAYA PO Take by mouth daily.      . predniSONE  (DELTASONE) 1 MG tablet take 1-5 tabs daily as directed  150 tablet  1  . predniSONE (DELTASONE) 5 MG tablet TAKE 1/2 TABLET BY MOUTH DAILY.  90 tablet  0  . Pseudoephedrine-Guaifenesin (MUCUS RELIEF D PO) Take by mouth as needed.      Marland Kitchen Respiratory Therapy Supplies (FLUTTER) DEVI Blow through 4 times, and do this 3 times daily when needed  1 each  0  . vitamin C (ASCORBIC ACID) 500 MG tablet Take 500 mg by mouth daily.         No current facility-administered medications on file prior to visit.

## 2013-03-23 NOTE — Telephone Encounter (Signed)
Per CY-she already has Ipratropium neb solution-will do the same as Spiriva and its cheaper.

## 2013-03-23 NOTE — Telephone Encounter (Signed)
ATC NA wcb 

## 2013-03-23 NOTE — Telephone Encounter (Signed)
Rx has been sent in per CY. Pt's daughter in law is aware.

## 2013-03-24 NOTE — Assessment & Plan Note (Signed)
Plan-update chest x-ray 

## 2013-03-24 NOTE — Telephone Encounter (Signed)
The only other long acting meds are metered inhalers, not nebulizer meds. I doubt that Dulera, symbicort or Advair would be cheaper. She may just need to take neb treatments with her regular meds, several times per day- can use it up to 4 times daily if needed.

## 2013-03-24 NOTE — Assessment & Plan Note (Signed)
We discussed medication for her reactive airways component. Meds refilled for her summer trip. Plan-chest x-ray

## 2013-03-24 NOTE — Telephone Encounter (Signed)
Called, spoke with pt's daughter.  Informed her of below per CDY. She verbalized understanding of this.   Dr. Maple Hudson, as perforomist is not covered by pt's insurance, daughter would like to know if there is something she can take to replace the perforomist that may be covered. Please advise.  Thank you.  No Known Allergies

## 2013-03-24 NOTE — Telephone Encounter (Signed)
I spoke with daughter and made her aware of CDY recs. She voiced her understanding and had no further questions

## 2013-07-09 ENCOUNTER — Telehealth: Payer: Self-pay | Admitting: Internal Medicine

## 2013-07-09 MED ORDER — IPRATROPIUM BROMIDE 0.02 % IN SOLN: 500.0000 ug | Freq: Four times a day (QID) | RESPIRATORY_TRACT | Status: DC

## 2013-07-09 NOTE — Telephone Encounter (Signed)
Will have CY sign this rx with the dx code on it.  Will fax to the pharmacy.

## 2013-07-09 NOTE — Telephone Encounter (Signed)
rx has been signed by CY and faxed back to the pharmacy.

## 2013-09-13 ENCOUNTER — Ambulatory Visit (INDEPENDENT_AMBULATORY_CARE_PROVIDER_SITE_OTHER): Payer: Medicare Other | Admitting: Internal Medicine

## 2013-09-13 ENCOUNTER — Encounter: Payer: Self-pay | Admitting: Internal Medicine

## 2013-09-13 ENCOUNTER — Ambulatory Visit (INDEPENDENT_AMBULATORY_CARE_PROVIDER_SITE_OTHER)
Admission: RE | Admit: 2013-09-13 | Discharge: 2013-09-13 | Disposition: A | Discharge status: Home / Self Care | Payer: Medicare Other | Source: Ambulatory Visit | Attending: Internal Medicine | Admitting: Internal Medicine

## 2013-09-13 VITALS — BP 110/64 | HR 85 | Ht 59.0 in | Wt 149.2 lb

## 2013-09-13 DIAGNOSIS — J45901 Unspecified asthma with (acute) exacerbation: Secondary | ICD-10-CM

## 2013-09-13 DIAGNOSIS — J4521 Mild intermittent asthma with (acute) exacerbation: Secondary | ICD-10-CM

## 2013-09-13 DIAGNOSIS — R911 Solitary pulmonary nodule: Secondary | ICD-10-CM

## 2013-09-13 DIAGNOSIS — I4891 Unspecified atrial fibrillation: Secondary | ICD-10-CM

## 2013-09-13 DIAGNOSIS — J984 Other disorders of lung: Secondary | ICD-10-CM

## 2013-09-13 DIAGNOSIS — J441 Chronic obstructive pulmonary disease with (acute) exacerbation: Secondary | ICD-10-CM

## 2013-09-13 MED ORDER — PNEUMOCOCCAL 13-VAL CONJ VACC IM SUSP: 0.5000 mL | Freq: Once | INTRAMUSCULAR | Status: DC

## 2013-09-13 NOTE — Patient Instructions (Signed)
Please check the name on your nebulizer medicine. You can use ipratropium up to 4 times daily.  If you have formoterol/ Perforomist, you can use that up to 2 x daily if needed  Order- cxr dx lung nodule  Pneumonia vaccine conjugate Prevnar 13

## 2013-09-13 NOTE — Assessment & Plan Note (Addendum)
Plan- update CXR- discussed

## 2013-09-13 NOTE — Assessment & Plan Note (Signed)
Rate controlled 

## 2013-09-13 NOTE — Progress Notes (Signed)
Patient ID: Carrie Heath, female    DOB: 1921-03-10, 79 y.o.   MRN: [id number]  HPI 04/02/11- 37 yoF followed for COPD and lung nodule, complicated by DM Last here December 05, 2010- note reviewed.  Here with son today, she complains mostly about arthritis pains. She hasn't needed prednisone in past week. Her breathing hasn't missed it. Hasn't needed O2 in a month or two from [place]. Not needing her nebulizer machine.   07/30/11- 89 yoF followed for COPD and lung nodule, complicated by DM    Daughter here Fairly stable. Children encourage her to continue her [name] and she takes 1/2 of a 1 mg prednisone tab. Tried off this summer and remembers restarting it because of cough and wheeze. She is vague about whether she has prednisone 1 vs 5 mg pills.  Still notes some wheeze.  CXR 05/30/2010- stable mild COPD, NAD, no nodules seen  01/27/12- 89 yoF followed for COPD and lung nodule, complicated by DM   Son here FOLLOWS FOR: reports still having some SOB occasionally, chest congestion and prod cough with foamy clear/cream colored mucus - but reports has not used the oxygen at home.  not using brovana daily Doesn't hear herself wheeze. Uses her nebulizer with Brovana about once a day. Uses pro air off-and-on, 0 to twice daily. Room air cleaner. Turned her oxygen back in because she wasn't using it. Daily prednisone now at 3 mg. Mostly notices exertional dyspnea when up for ADLs like bathroom. Wants print prescriptions for her medications, planning 4-6 week trip to [place].  09/14/12- 89 yoF followed for COPD and lung nodule, complicated by DM, AFib                  Son here SOB and wheezing at times; cough mostly when congested and before/after Brovana tx. Has had flu vaccine. Has seen Dr. Daleen Squibb for management of atrial fibrillation. Continues prednisone 2.5 mg daily for COPD. Without that, she gets wheeze and chest congestion. Using neb brovana once or twice daily with rare need for  rescue inhaler. No longer has oxygen at home. COPD assessment test (CAT) score 21/40  03/14/13- 89 yoF followed for COPD and lung nodule, complicated by DM, AFib                  Son here FOLLOWS FOR: not able to get brovana neb rx; will need ALL rx's refilled today as she is going out of state.  Son is here [name] is too expensive. We discussed her medications and especially her nebulizer meds. She is going to [place] again this summer. Knees limit her walking. Little cough or phlegm now. CXR 01/28/12 IMPRESSION:  Chronic bronchitic changes.  Original Report Authenticated By: Lollie Marrow, M.D.  09/13/13- 89 yoF followed for COPD and lung nodule, complicated by DM, AFib                  DIL here FOLLOWS FOR: pt states she still has to use rescue inhaler sometimes(BID); has good and bad days. Cough at night that sounds wet.  Cough and wheeze w/ weather change. Walks at home w/ walker and cane. Daily prednisone 1/2 x 5  Mg Using neb ipratropium 2-3 x/ day. Not using Perforomist or Brovana- too expensive.  Son needs FMLA  Review of Systems-see HPI Constitutional:   No weight loss, night sweats,  Fevers, chills, fatigue, lassitude. HEENT:   No headaches,  Difficulty swallowing,  Tooth/dental problems,  Sore throat,  No sneezing, itching, ear ache, nasal congestion, post nasal drip,  CV:  No chest pain, orthopnea, PND, swelling in lower extremities, anasarca, dizziness, +palpitations GI  No heartburn, indigestion, abdominal pain, nausea, vomiting,  Resp: + shortness of breath with exertion or at rest.  No excess mucus, +little productive cough,              + non-productive cough,  No- coughing up of blood.  No change in color of mucus.  Occasional wheezing.  Skin: no rash or lesions. GU: no dysuria, . MS: No acute pain. Psych:  No change in mood or affect. No depression or anxiety.  No memory loss.  Objective:  General- Alert, Oriented, Affect-appropriate, Distress- none  acute. +Wheelchair Skin- rash-none, lesions- none, excoriation- none Lymphadenopathy- none Head- atraumatic            Eyes- Gross vision intact, PERRLA, conjunctivae clear secretions            Ears- Hearing, canals normal for age            Nose- Clear, no-Septal dev, mucus, polyps, erosion, perforation             Throat- Mallampati II , mucosa clear , drainage- none, tonsils- atrophic Neck- flexible , trachea midline, no stridor , thyroid nl, carotid no bruit Chest - symmetrical excursion , unlabored           Heart/CV- +ireg-reg/ Afib , no murmur , no gallop  , no rub, nl s1 s2                           - JVD- none , edema- none, stasis changes- none, varices- none           Lung- clear, cough- none , dullness-none, rub- none           Chest wall-  Abd-  Br/ Gen/ Rectal- Not done, not indicated Extrem- cyanosis- none, clubbing, none, atrophy- none, strength- nl Neuro- grossly intact to observation

## 2013-09-13 NOTE — Assessment & Plan Note (Signed)
Little change. No acute process apparent. Plan- CXR, Prevnar 13 vaccine

## 2013-09-27 NOTE — Assessment & Plan Note (Addendum)
Cough and wheezing -exacerbation with season change Plan-medication talk/education,

## 2013-10-14 ENCOUNTER — Other Ambulatory Visit: Payer: Self-pay | Admitting: Internal Medicine

## 2013-10-14 MED ORDER — PREDNISONE 1 MG PO TABS: ORAL_TABLET | ORAL | Status: DC

## 2013-10-14 NOTE — Telephone Encounter (Signed)
Rx refill sent to pharmacy. 

## 2013-10-15 ENCOUNTER — Other Ambulatory Visit: Payer: Self-pay | Admitting: Internal Medicine

## 2013-10-15 MED ORDER — ALBUTEROL SULFATE HFA 108 (90 BASE) MCG/ACT IN AERS: 2.0000 | INHALATION_SPRAY | RESPIRATORY_TRACT | Status: AC | PRN

## 2013-11-19 ENCOUNTER — Telehealth: Payer: Self-pay | Admitting: *Deleted

## 2013-11-19 NOTE — Telephone Encounter (Signed)
Please advise 

## 2013-11-19 NOTE — Telephone Encounter (Signed)
Pt is on pradaxa and her insurance has changed. She is not able to afford this can she switch to another medication that may be cheaper.

## 2013-12-07 ENCOUNTER — Ambulatory Visit (INDEPENDENT_AMBULATORY_CARE_PROVIDER_SITE_OTHER): Payer: Medicare Other | Admitting: Cardiovascular Disease

## 2013-12-07 ENCOUNTER — Encounter: Payer: Self-pay | Admitting: Cardiovascular Disease

## 2013-12-07 VITALS — BP 128/82 | HR 96 | Ht 60.0 in | Wt 146.0 lb

## 2013-12-07 DIAGNOSIS — I4891 Unspecified atrial fibrillation: Secondary | ICD-10-CM

## 2013-12-07 NOTE — Patient Instructions (Signed)
Your physician wants you to follow-up in:  6 months. You will receive a reminder letter in the mail two months in advance. If you don't receive a letter, please call our office to schedule the follow-up appointment.   

## 2013-12-07 NOTE — Progress Notes (Signed)
Patient ID: Carrie Heath, female   DOB: [birth year], 80 y.o.   MRN: [id number]      SUBJECTIVE: Patient is a 81-year-old woman here for followup for her permanent atrial fibrillation. Her echocardiogram in may 2013 showed vigorous left ventricular systolic function, EF 70-75%, PFO with left to right shunting, with biatrial enlargement and moderate tricuspid regurgitation.  She denies chest pain, shortness of breath, palpitations, lightheadedness, dizziness, orthopnea and PND. Her primary complaint is bilateral feet neuropathy which is alleviated by taking gabapentin, which she sometimes forgets to do. She denies any hematuria or hematochezia/melena.  No Known Allergies  Current Outpatient Prescriptions  Medication Sig Dispense Refill  . albuterol (PROAIR HFA) 108 (90 BASE) MCG/ACT inhaler Inhale 2 puffs into the lungs every 4 (four) hours as needed for wheezing or shortness of breath.  8.5 g  11  . calcium citrate-vitamin D (CITRACAL+D) 315-200 MG-UNIT per tablet Take 1 tablet by mouth 2 (two) times daily.      . dabigatran (PRADAXA) 75 MG CAPS Take 1 capsule (75 mg total) by mouth every 12 (twelve) hours.  180 capsule  1  . formoterol (PERFOROMIST) 20 MCG/2ML nebulizer solution Take 2 mLs (20 mcg total) by nebulization 2 (two) times daily.  60 mL  prn  . gabapentin (NEURONTIN) 300 MG capsule Take 600 mg by mouth 3 (three) times daily.       . Glucosamine-Chondroit-Vit C-Mn (GLUCOSAMINE CHONDROITIN COMPLX) CAPS Take 1 capsule by mouth 2 (two) times daily.       Marland Kitchen ibuprofen (ADVIL,MOTRIN) 200 MG tablet 2 tabs by mouth twice daily, 1 at bedtime as needed      . ipratropium (ATROVENT) 0.02 % nebulizer solution Take 2.5 mLs (500 mcg total) by nebulization 4 (four) times daily. DX 496  300 mL  6  . metFORMIN (GLUCOPHAGE) 500 MG tablet Take 500 mg by mouth 2 (two) times daily with a meal.        . Multiple Vitamins-Minerals (MULTI COMPLETE PO) Take by mouth daily.      . Omega-3 Fatty Acids (FISH  OIL) 1000 MG CAPS Take 1 capsule by mouth daily.       Marland Kitchen omeprazole (PRILOSEC) 20 MG capsule Take 1 capsule by mouth daily.      . predniSONE (DELTASONE) 1 MG tablet take 1-5 tabs daily as directed  150 tablet  0  . Pseudoephedrine-Guaifenesin (MUCUS RELIEF D PO) Take by mouth as needed.      Marland Kitchen Respiratory Therapy Supplies (FLUTTER) DEVI Blow through 4 times, and do this 3 times daily when needed  1 each  0  . vitamin C (ASCORBIC ACID) 500 MG tablet Take 500 mg by mouth daily.         No current facility-administered medications for this visit.    Past Medical History  Diagnosis Date  . DM type 2 (diabetes mellitus, type 2)   . Bronchitis   . COPD (chronic obstructive pulmonary disease)   . Hyperlipidemia   . HTN (hypertension)   . Neuropathy     Past Surgical History  Procedure Laterality Date  . Thyroidectomy, partial      remote  . Tonsillectomy    . Appendectomy    . Cholecystectomy    . Total abdominal hysterectomy    . Bladder surgery      History   Social History  . Marital Status: Widowed    Spouse Name: N/A    Number of Children: 2  . Years of Education:  N/A   Occupational History  . retired    Social History Main Topics  . Smoking status: Former Smoker    Quit date: 11/11/2006  . Smokeless tobacco: Not on file  . Alcohol Use: Not on file  . Drug Use: Not on file  . Sexual Activity: Not on file   Other Topics Concern  . 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  Lungs: Clear to auscultation bilaterally with normal respiratory effort. CV: Nondisplaced PMI.  Heart irregular rhythm, normal S1/S2, no S3, no murmur.  No peripheral edema.  No carotid bruit.  Normal pedal pulses.  Abdomen:  Soft, nontender, no hepatosplenomegaly, no distention.  Neurologic: Alert and oriented x 3.  Psych: Normal affect. Extremities: No clubbing or cyanosis.   ECG: reviewed and available in electronic records.      ASSESSMENT AND PLAN: 1. Permanent atrial fibrillation: I manually recorded her HR at 84 bpm. I will not add any rate-slowing agents as she is asymptomatic. Continue Pradaxa.  Dispo: f/u 6 months.  Prentice DockerSuresh Koneswaran, M.D., F.A.C.C.

## 2014-01-07 ENCOUNTER — Other Ambulatory Visit: Payer: Self-pay | Admitting: Internal Medicine

## 2014-01-07 MED ORDER — IPRATROPIUM BROMIDE 0.02 % IN SOLN: 500.0000 ug | Freq: Four times a day (QID) | RESPIRATORY_TRACT | Status: DC

## 2014-01-07 NOTE — Telephone Encounter (Signed)
Rx sent to pharmacy as requested; electronically.

## 2014-02-15 ENCOUNTER — Telehealth: Payer: Self-pay | Admitting: Internal Medicine

## 2014-02-15 MED ORDER — PREDNISONE 1 MG PO TABS: ORAL_TABLET | ORAL | Status: DC

## 2014-02-15 MED ORDER — PREDNISONE 5 MG PO TABS: ORAL_TABLET | ORAL | Status: DC

## 2014-02-15 NOTE — Telephone Encounter (Signed)
I called and spoke with Jamesetta SoPhyllis. Requesting RX for pt prednisone 1mg  and for 5 mg tabs. She reports some days pt takes more than 5 mg. It just depends on the kind of day she is having. We last sent RX for pred 1 mg 1-5 mg daily #150 tabs x 0 refills. Please advise CDY thanks

## 2014-02-15 NOTE — Telephone Encounter (Signed)
Carrie Heath is aware RX's have been sent in. Nothing further needed

## 2014-02-15 NOTE — Telephone Encounter (Signed)
Ok to refill prednisone 1 mg, # 150, 1-10 mg daily as needed   Ref x 5  Olk to Rx prednisone 5 mg. # 30, 1-10 mg daily as needed   Ref x 5

## 2014-02-15 NOTE — Telephone Encounter (Signed)
Spoke with [name]. Made him aware of prednisone directions. Nothing further needed

## 2014-03-14 ENCOUNTER — Encounter: Payer: Self-pay | Admitting: Internal Medicine

## 2014-03-14 ENCOUNTER — Ambulatory Visit (INDEPENDENT_AMBULATORY_CARE_PROVIDER_SITE_OTHER): Payer: Medicare Other | Admitting: Internal Medicine

## 2014-03-14 VITALS — BP 112/66 | HR 85 | Ht 59.0 in | Wt 141.0 lb

## 2014-03-14 DIAGNOSIS — J441 Chronic obstructive pulmonary disease with (acute) exacerbation: Secondary | ICD-10-CM

## 2014-03-14 MED ORDER — METHYLPREDNISOLONE ACETATE 80 MG/ML IJ SUSP
80.0000 mg | Freq: Once | INTRAMUSCULAR | Status: AC
  Administered 2014-03-14: 80 mg via INTRAMUSCULAR

## 2014-03-14 MED ORDER — LEVALBUTEROL HCL 0.63 MG/3ML IN NEBU
0.6300 mg | INHALATION_SOLUTION | Freq: Once | RESPIRATORY_TRACT | Status: AC
  Administered 2014-03-14: 0.63 mg via RESPIRATORY_TRACT

## 2014-03-14 NOTE — Patient Instructions (Signed)
Suggest nebulizer Perforomist (or Brovana) twice daily. Take a third treatment only on very difficult day if you think you need it.  Use your albuterol rescue inhaler 2 puffs up to 4 times daily if needed.  Continue prednisone 1 mg once daily  Today we are giving neb xopenex 0.63 and depomedrol 80 mg

## 2014-03-14 NOTE — Progress Notes (Signed)
Patient ID: Carrie Heath, female    DOB: December 16, 1920, 80 y.o.   MRN: [id number]  HPI 04/02/11- [age] yoF followed for COPD and lung nodule, complicated by DM Last here December 05, 2010- note reviewed.  Here with son today, she complains mostly about arthritis pains. She hasn't needed prednisone in past week. Her breathing hasn't missed it. Hasn't needed O2 in a month or two from [facility]. Not needing her nebulizer machine.   07/30/11- 89 yoF followed for COPD and lung nodule, complicated by DM    Daughter here Fairly stable. Children encourage her to continue her Rosalyn GessBrovana and she takes 1/2 of a 1 mg prednisone tab. Tried off this summer and remembers restarting it because of cough and wheeze. She is vague about whether she has prednisone 1 vs 5 mg pills.  Still notes some wheeze.  CXR 05/30/2010- stable mild COPD, NAD, no nodules seen  01/27/12- 89 yoF followed for COPD and lung nodule, complicated by DM   Son here FOLLOWS FOR: reports still having some SOB occasionally, chest congestion and prod cough with foamy clear/cream colored mucus - but reports has not used the oxygen at home.  not using brovana daily Doesn't hear herself wheeze. Uses her nebulizer with Brovana about once a day. Uses pro air off-and-on, 0 to twice daily. Room air cleaner. Turned her oxygen back in because she wasn't using it. Daily prednisone now at 3 mg. Mostly notices exertional dyspnea when up for ADLs like bathroom. Wants print prescriptions for her medications, planning 4-6 week trip to [place].  09/14/12- 89 yoF followed for COPD and lung nodule, complicated by DM, AFib                  Son here SOB and wheezing at times; cough mostly when congested and before/after Brovana tx. Has had flu vaccine. Has seen Dr. Daleen SquibbWall for management of atrial fibrillation. Continues prednisone 2.5 mg daily for COPD. Without that, she gets wheeze and chest congestion. Using neb brovana once or twice daily with rare need for  rescue inhaler. No longer has oxygen at home. COPD assessment test (CAT) score 21/40  03/14/13- 89 yoF followed for COPD and lung nodule, complicated by DM, AFib                  Son here FOLLOWS FOR: not able to get brovana neb rx; will need ALL rx's refilled today as she is going out of state.  Son is here Rosalyn GessBrovana is too expensive. We discussed her medications and especially her nebulizer meds. She is going to [place] again this summer. Knees limit her walking. Little cough or phlegm now. CXR 01/28/12 IMPRESSION:  Chronic bronchitic changes.  Original Report Authenticated By: Lollie MarrowMARK A. BOLES, M.D.  09/13/13- 89 yoF followed for COPD and lung nodule, complicated by DM, AFib                  DIL here FOLLOWS FOR: pt states she still has to use rescue inhaler sometimes(BID); has good and bad days. Cough at night that sounds wet.  Cough and wheeze w/ weather change. Walks at home w/ walker and cane. Daily prednisone 1/2 x 5  Mg Using neb ipratropium 2-3 x/ day. Not using Perforomist or Brovana- too expensive.  Son needs FMLA  03/14/14- 89 yoF followed for COPD and lung nodule, complicated by DM, AFib                 Son here  FOLLOWS FOR:  Pt now [facility] in [place].  Reports increased sob, cough with yellow mucus, rattling and wheezing 2-3 weeks.  Discuss pred and other meds today She is now living in [facility] in [place]. She manages her own inhalers and nebulizer. She had been off of maintenance prednisone and they recently restarted at 1 mg daily. G.radually increasing chest congestion and some yellow sputum. CXR 09/14/13 IMPRESSION:  Minimal atelectasis at the lung bases laterally. No visible  pulmonary nodules.  Electronically Signed  By: Geanie CooleyJim Maxwell M.D.  On: 09/13/2013 14:39  Review of Systems-see HPI Constitutional:   No weight loss, night sweats,  Fevers, chills, fatigue, lassitude. HEENT:   No headaches,  Difficulty swallowing,  Tooth/dental problems,  Sore  throat,                No sneezing, itching, ear ache, nasal congestion, post nasal drip,  CV:  No chest pain, orthopnea, PND, swelling in lower extremities, anasarca, dizziness, +palpitations GI  No heartburn, indigestion, abdominal pain, nausea, vomiting,  Resp: + shortness of breath with exertion or at rest.  No excess mucus, + productive cough,              + non-productive cough,  No- coughing up of blood.  + change in color of mucus.  Occasional                    wheezing.  Skin: no rash or lesions. GU: no dysuria, . MS: No acute pain. Psych:  No change in mood or affect. No depression or anxiety.  No memory loss.  Objective:  General- Alert, Oriented, Affect-appropriate, Distress- none acute. +Wheelchair, room air Skin- rash-none, lesions- none, excoriation- none Lymphadenopathy- none Head- atraumatic            Eyes- Gross vision intact, PERRLA, conjunctivae clear secretions            Ears- Hearing, canals normal for age            Nose- Clear, no-Septal dev, mucus, polyps, erosion, perforation             Throat- Mallampati II , mucosa clear , drainage- none, tonsils- atrophic Neck- flexible , trachea midline, no stridor , thyroid nl, carotid no bruit Chest - symmetrical excursion , unlabored           Heart/CV- +feels reg/ Afib , no murmur , no gallop  , no rub, nl s1 s2                           - JVD- none , edema- none, stasis changes- none, varices- none           Lung- clear, cough- none , dullness-none, rub- none           Chest wall-  Abd-  Br/ Gen/ Rectal- Not done, not indicated Extrem- cyanosis- none, clubbing, none, atrophy- none, strength- nl Neuro- grossly intact to observation

## 2014-03-14 NOTE — Assessment & Plan Note (Signed)
Severe COPD, advancing age and increasing difficulty managing her own affairs. I have tried to reconcile medications between our list and the Assisted Living facility list. She should only use an extended action nebulizer solution twice or at most 3 times a day. We have her listed with perforomist, although the son refers to brovana. Rescue inhaler up to 4 times daily if needed. Okay to continue low-dose prednisone for stability and probably some adrenal support.

## 2014-04-25 ENCOUNTER — Encounter: Payer: Self-pay | Admitting: Internal Medicine

## 2014-04-25 ENCOUNTER — Ambulatory Visit (INDEPENDENT_AMBULATORY_CARE_PROVIDER_SITE_OTHER)
Admission: RE | Admit: 2014-04-25 | Discharge: 2014-04-25 | Disposition: A | Discharge status: Home / Self Care | Payer: Medicare Other | Source: Ambulatory Visit | Attending: Internal Medicine | Admitting: Internal Medicine

## 2014-04-25 ENCOUNTER — Ambulatory Visit (INDEPENDENT_AMBULATORY_CARE_PROVIDER_SITE_OTHER): Payer: Medicare Other | Admitting: Internal Medicine

## 2014-04-25 VITALS — BP 132/80 | HR 82 | Ht 59.0 in | Wt 154.0 lb

## 2014-04-25 DIAGNOSIS — J438 Other emphysema: Secondary | ICD-10-CM

## 2014-04-25 DIAGNOSIS — J45909 Unspecified asthma, uncomplicated: Secondary | ICD-10-CM

## 2014-04-25 DIAGNOSIS — I4891 Unspecified atrial fibrillation: Secondary | ICD-10-CM

## 2014-04-25 DIAGNOSIS — J449 Chronic obstructive pulmonary disease, unspecified: Secondary | ICD-10-CM

## 2014-04-25 DIAGNOSIS — R911 Solitary pulmonary nodule: Secondary | ICD-10-CM

## 2014-04-25 DIAGNOSIS — J4540 Moderate persistent asthma, uncomplicated: Secondary | ICD-10-CM

## 2014-04-25 NOTE — Patient Instructions (Signed)
Order- CXR   Dx COPD, lung nodule,   I think you are probably retaining too much fluid. Your doctor may need to adjust your fluid medications.

## 2014-04-25 NOTE — Progress Notes (Signed)
Patient ID: Carrie Heath, female    DOB: December 16, 1920, 80 y.o.   MRN: [id number]  HPI 04/02/11- [age] yoF followed for COPD and lung nodule, complicated by DM Last here December 05, 2010- note reviewed.  Here with son today, she complains mostly about arthritis pains. She hasn't needed prednisone in past week. Her breathing hasn't missed it. Hasn't needed O2 in a month or two from [facility]. Not needing her nebulizer machine.   07/30/11- 89 yoF followed for COPD and lung nodule, complicated by DM    Daughter here Fairly stable. Children encourage her to continue her Rosalyn GessBrovana and she takes 1/2 of a 1 mg prednisone tab. Tried off this summer and remembers restarting it because of cough and wheeze. She is vague about whether she has prednisone 1 vs 5 mg pills.  Still notes some wheeze.  CXR 05/30/2010- stable mild COPD, NAD, no nodules seen  01/27/12- 89 yoF followed for COPD and lung nodule, complicated by DM   Son here FOLLOWS FOR: reports still having some SOB occasionally, chest congestion and prod cough with foamy clear/cream colored mucus - but reports has not used the oxygen at home.  not using brovana daily Doesn't hear herself wheeze. Uses her nebulizer with Brovana about once a day. Uses pro air off-and-on, 0 to twice daily. Room air cleaner. Turned her oxygen back in because she wasn't using it. Daily prednisone now at 3 mg. Mostly notices exertional dyspnea when up for ADLs like bathroom. Wants print prescriptions for her medications, planning 4-6 week trip to [place].  09/14/12- 89 yoF followed for COPD and lung nodule, complicated by DM, AFib                  Son here SOB and wheezing at times; cough mostly when congested and before/after Brovana tx. Has had flu vaccine. Has seen Dr. Daleen SquibbWall for management of atrial fibrillation. Continues prednisone 2.5 mg daily for COPD. Without that, she gets wheeze and chest congestion. Using neb brovana once or twice daily with rare need for  rescue inhaler. No longer has oxygen at home. COPD assessment test (CAT) score 21/40  03/14/13- 89 yoF followed for COPD and lung nodule, complicated by DM, AFib                  Son here FOLLOWS FOR: not able to get brovana neb rx; will need ALL rx's refilled today as she is going out of state.  Son is here Rosalyn GessBrovana is too expensive. We discussed her medications and especially her nebulizer meds. She is going to [place] again this summer. Knees limit her walking. Little cough or phlegm now. CXR 01/28/12 IMPRESSION:  Chronic bronchitic changes.  Original Report Authenticated By: Lollie MarrowMARK A. BOLES, M.D.  09/13/13- 89 yoF followed for COPD and lung nodule, complicated by DM, AFib                  DIL here FOLLOWS FOR: pt states she still has to use rescue inhaler sometimes(BID); has good and bad days. Cough at night that sounds wet.  Cough and wheeze w/ weather change. Walks at home w/ walker and cane. Daily prednisone 1/2 x 5  Mg Using neb ipratropium 2-3 x/ day. Not using Perforomist or Brovana- too expensive.  Son needs FMLA  03/14/14- 89 yoF followed for COPD and lung nodule, complicated by DM, AFib                 Son here  FOLLOWS FOR:  [place] now [facility] [place].  Reports increased sob, cough with yellow mucus, rattling and wheezing 2-3 weeks.  Discuss pred and other meds today She is now living in [facility] in [place]. She manages her own inhalers and nebulizer. She had been off of maintenance prednisone and they recently restarted at 1 mg daily. G.radually increasing chest congestion and some yellow sputum. CXR 09/14/13 IMPRESSION:  Minimal atelectasis at the lung bases laterally. No visible  pulmonary nodules.  Electronically Signed  By: Geanie CooleyJim Maxwell M.D.  On: 09/13/2013 14:39  04/25/14- 89 yoF followed for COPD and lung nodule, complicated by DM, AFib                 Son here FOLLOWS FOR: pt states her breathing is stable.  Denies any cough/ congestion, sob, wheezing.    Now in assisted living and doing very well. Off prednisone. Occasional rescue inhaler. Not needing her nebulizer.  Review of Systems-see HPI Constitutional:   No weight loss, night sweats,  Fevers, chills, fatigue, lassitude. HEENT:   No headaches,  Difficulty swallowing,  Tooth/dental problems,  Sore throat,                No sneezing, itching, ear ache, nasal congestion, post nasal drip,  CV:  No chest pain, orthopnea, PND, swelling in lower extremities, anasarca, dizziness, +palpitations GI  No heartburn, indigestion, abdominal pain, nausea, vomiting,  Resp: + shortness of breath with exertion or at rest.  No excess mucus, no- productive cough,              + non-productive cough,  No- coughing up of blood.  No-change in color of mucus.  Occasional                    wheezing.  Skin: no rash or lesions. GU: no dysuria, . MS: No acute pain. Psych:  No change in mood or affect. No depression or anxiety.  No memory loss.  Objective:  General- Alert, Oriented, Affect-appropriate, Distress- none acute. +Wheelchair, room air Skin- rash-none, lesions- none, excoriation- none Lymphadenopathy- none Head- atraumatic            Eyes- Gross vision intact, PERRLA, conjunctivae clear secretions            Ears- Hearing, canals normal for age            Nose- Clear, no-Septal dev, mucus, polyps, erosion, perforation             Throat- Mallampati II , mucosa clear , drainage- none, tonsils- atrophic Neck- flexible , trachea midline, no stridor , thyroid nl, carotid no bruit Chest - symmetrical excursion , unlabored           Heart/CV- +feels reg/ Afib , no murmur , no gallop  , no rub, nl s1 s2                           - JVD+trace , edema+2, stasis changes- none, varices- none           Lung- +few basilar crackles, cough- none , dullness-none, rub- none           Chest wall-  Abd-  Br/ Gen/ Rectal- Not done, not indicated Extrem- cyanosis- none, clubbing, none, atrophy- none, strength-  nl Neuro- grossly intact to observation

## 2014-04-26 ENCOUNTER — Telehealth: Payer: Self-pay | Admitting: Internal Medicine

## 2014-04-26 NOTE — Progress Notes (Signed)
Quick Note:  lmtcb ______ 

## 2014-04-26 NOTE — Telephone Encounter (Signed)
lmtcb

## 2014-04-27 ENCOUNTER — Encounter (HOSPITAL_COMMUNITY): Payer: Self-pay | Admitting: Emergency Medicine

## 2014-04-27 ENCOUNTER — Emergency Department (HOSPITAL_COMMUNITY)
Admission: EM | Admit: 2014-04-27 | Discharge: 2014-04-28 | Disposition: A | Discharge status: Home / Self Care | Payer: Medicare Other | Attending: Emergency Medicine | Admitting: Emergency Medicine

## 2014-04-27 DIAGNOSIS — J810 Acute pulmonary edema: Secondary | ICD-10-CM | POA: Diagnosis not present

## 2014-04-27 DIAGNOSIS — Z87891 Personal history of nicotine dependence: Secondary | ICD-10-CM | POA: Diagnosis not present

## 2014-04-27 DIAGNOSIS — I10 Essential (primary) hypertension: Secondary | ICD-10-CM | POA: Insufficient documentation

## 2014-04-27 DIAGNOSIS — E785 Hyperlipidemia, unspecified: Secondary | ICD-10-CM | POA: Diagnosis not present

## 2014-04-27 DIAGNOSIS — M7989 Other specified soft tissue disorders: Secondary | ICD-10-CM | POA: Diagnosis present

## 2014-04-27 DIAGNOSIS — Z79899 Other long term (current) drug therapy: Secondary | ICD-10-CM | POA: Diagnosis not present

## 2014-04-27 DIAGNOSIS — E119 Type 2 diabetes mellitus without complications: Secondary | ICD-10-CM | POA: Diagnosis not present

## 2014-04-27 DIAGNOSIS — R600 Localized edema: Secondary | ICD-10-CM

## 2014-04-27 DIAGNOSIS — J4489 Other specified chronic obstructive pulmonary disease: Secondary | ICD-10-CM | POA: Insufficient documentation

## 2014-04-27 DIAGNOSIS — J449 Chronic obstructive pulmonary disease, unspecified: Secondary | ICD-10-CM | POA: Diagnosis not present

## 2014-04-27 DIAGNOSIS — G608 Other hereditary and idiopathic neuropathies: Secondary | ICD-10-CM | POA: Insufficient documentation

## 2014-04-27 DIAGNOSIS — J811 Chronic pulmonary edema: Secondary | ICD-10-CM

## 2014-04-27 NOTE — Progress Notes (Signed)
Quick Note:  Called and spoke to [place] with [facility] assisted [facility] in regards to Stewart[place]Louise Heath's CXR results per CY. Ceceilla verbalized understanding and also requested the result to be faxed to their facility at [phone]. Results will be faxed. Nothing further needed. ______

## 2014-04-27 NOTE — Telephone Encounter (Signed)
Called and spoke to [place] with [facility] assisted [facility] in regards to Carrie Heath's CXR results per CY. Ceceilla verbalized understanding and also requested the result to be faxed to their facility at [phone]. Results will be faxed. Nothing further needed.

## 2014-04-27 NOTE — ED Notes (Signed)
Pt reports increasing bilateral lower extremity swelling over the last few weeks. Denies pain and SOB.  3+ pitting edema to lower extremities.

## 2014-04-28 ENCOUNTER — Emergency Department (HOSPITAL_COMMUNITY): Payer: Medicare Other

## 2014-04-28 DIAGNOSIS — M7989 Other specified soft tissue disorders: Secondary | ICD-10-CM | POA: Diagnosis not present

## 2014-04-28 LAB — BASIC METABOLIC PANEL
BUN: 11 mg/dL (ref 6–23)
CO2: 26 mEq/L (ref 19–32)
Calcium: 8.7 mg/dL (ref 8.4–10.5)
Chloride: 94 mEq/L — ABNORMAL LOW (ref 96–112)
Creatinine, Ser: 0.74 mg/dL (ref 0.50–1.10)
GFR, EST AFRICAN AMERICAN: 84 mL/min — AB (ref >90)
GFR, EST NON AFRICAN AMERICAN: 73 mL/min — AB (ref >90)
GLUCOSE: 92 mg/dL (ref 70–99)
POTASSIUM: 4.4 meq/L (ref 3.7–5.3)
SODIUM: 132 meq/L — AB (ref 137–147)

## 2014-04-28 LAB — CBC WITH DIFFERENTIAL/PLATELET
Basophils Absolute: 0.1 10*3/uL (ref 0.0–0.1)
Basophils Relative: 1 % (ref 0–1)
Eosinophils Absolute: 0.2 10*3/uL (ref 0.0–0.7)
Eosinophils Relative: 3 % (ref 0–5)
HCT: 33.8 % — ABNORMAL LOW (ref 36.0–46.0)
Hemoglobin: 11.6 g/dL — ABNORMAL LOW (ref 12.0–15.0)
LYMPHS ABS: 1.1 10*3/uL (ref 0.7–4.0)
LYMPHS PCT: 14 % (ref 12–46)
MCH: 31.8 pg (ref 26.0–34.0)
MCHC: 34.3 g/dL (ref 30.0–36.0)
MCV: 92.6 fL (ref 78.0–100.0)
Monocytes Absolute: 0.8 10*3/uL (ref 0.1–1.0)
Monocytes Relative: 10 % (ref 3–12)
NEUTROS ABS: 5.7 10*3/uL (ref 1.7–7.7)
NEUTROS PCT: 72 % (ref 43–77)
PLATELETS: 213 10*3/uL (ref 150–400)
RBC: 3.65 MIL/uL — AB (ref 3.87–5.11)
RDW: 13.5 % (ref 11.5–15.5)
WBC: 7.9 10*3/uL (ref 4.0–10.5)

## 2014-04-28 LAB — PRO B NATRIURETIC PEPTIDE: PRO B NATRI PEPTIDE: 1678 pg/mL — AB (ref 0–450)

## 2014-04-28 MED ORDER — FUROSEMIDE 40 MG PO TABS
20.0000 mg | ORAL_TABLET | Freq: Once | ORAL | Status: AC
  Administered 2014-04-28: 20 mg via ORAL
  Filled 2014-04-28: qty 1

## 2014-04-28 MED ORDER — FUROSEMIDE 20 MG PO TABS: 20.0000 mg | ORAL_TABLET | Freq: Every day | ORAL | Status: DC

## 2014-04-28 MED ORDER — HYDROGEN PEROXIDE 3 % EX SOLN
CUTANEOUS | Status: AC
  Filled 2014-04-28: qty 473

## 2014-04-28 NOTE — ED Provider Notes (Signed)
CSN: [id number]     Arrival date & time 04/27/14  2350 History  This chart was scribed for Enid SkeensJoshua M Zavitz, MD by Milly JakobJohn Lee Graves, ED Scribe. The patient was seen in room APA02/APA02. Patient's care was started at 12:17 AM.    Chief Complaint  Patient presents with  . Leg Swelling   The history is provided by the patient. No language interpreter was used.   HPI Comments: Carrie Heath is a 80 y.o. female with a history of COPD who presents to the Emergency Department complaining of gradually worsening bilateral lower extremity swelling onset a few weeks ago. Patient reports that [facility] assisted [facility] [address] [facility], and states that the leg swelling began when she moved in. Patient denies history of heart failure, but on Monday she was told she has fluid around her heart. Patient reports that she has a history of atrial fibrillation for which she is on medication. She reports history of back problems, which are baseline today. She denies headaches, blurred vision or vision changes, SOB, chest pain, abdominal pain, fever, long distance travel. She denies recent surgeries. Patient denies history of DVT. She reports taking a short trial of Lasix for 2 or 3 days recently. She denies history of kidney problems, liver problems, HTN, CHF. She reports that her COPD and bronchitis are controlled.    Past Medical History  Diagnosis Date  . DM type 2 (diabetes mellitus, type 2)   . Bronchitis   . COPD (chronic obstructive pulmonary disease)   . Hyperlipidemia   . HTN (hypertension)   . Neuropathy    Past Surgical History  Procedure Laterality Date  . Thyroidectomy, partial      remote  . Tonsillectomy    . Appendectomy    . Cholecystectomy    . Total abdominal hysterectomy    . Bladder surgery     Family History  Problem Relation Age of Onset  . Heart disease Father   . Heart disease Brother     CABG  . Alzheimer's disease Mother    History  Substance Use Topics  . Smoking  status: Former Smoker    Quit date: 11/11/2006  . Smokeless tobacco: Not on file  . Alcohol Use: Not on file   OB History   Grav Para Term Preterm Abortions TAB SAB Ect Mult Living                 Review of Systems  Constitutional: Negative for fever.  Eyes: Negative for visual disturbance.       No blurred vision  Respiratory: Negative for shortness of breath.   Cardiovascular: Positive for leg swelling (Bilateral). Negative for chest pain.  Gastrointestinal: Negative for abdominal pain.  Neurological: Negative for headaches.  All other systems reviewed and are negative.     Allergies  Review of patient's allergies indicates no known allergies.  Home Medications   Prior to Admission medications   Medication Sig Start Date End Date Taking? Authorizing Provider  albuterol (PROAIR HFA) 108 (90 BASE) MCG/ACT inhaler Inhale 2 puffs into the lungs every 4 (four) hours as needed for wheezing or shortness of breath. 10/15/13 12/02/14  Waymon Budgelinton D Young, MD  calcium citrate-vitamin D (CITRACAL+D) 315-200 MG-UNIT per tablet Take 1 tablet by mouth 2 (two) times daily.    Historical Provider, MD  dabigatran (PRADAXA) 75 MG CAPS Take 1 capsule (75 mg total) by mouth every 12 (twelve) hours. 12/02/12   Gaylord Shihhomas C Wall, MD  gabapentin (NEURONTIN) 300 MG capsule Take 600 mg by mouth 3 (three) times daily.     Historical Provider, MD  Glucosamine-Chondroit-Vit C-Mn (GLUCOSAMINE CHONDROITIN COMPLX) CAPS Take 1 capsule by mouth 2 (two) times daily.     Historical Provider, MD  ibuprofen (ADVIL,MOTRIN) 200 MG tablet 2 tabs by mouth twice daily, 1 at bedtime as needed    Historical Provider, MD  ipratropium (ATROVENT) 0.02 % nebulizer solution Take 2.5 mLs (500 mcg total) by nebulization 4 (four) times daily. DX 496 01/07/14   Waymon Budgelinton D Young, MD  metFORMIN (GLUCOPHAGE) 500 MG tablet Take 500 mg by mouth 2 (two) times daily with a meal.      Historical Provider, MD  Multiple Vitamins-Minerals (MULTI  COMPLETE PO) Take by mouth daily.    Historical Provider, MD  Omega-3 Fatty Acids (FISH OIL) 1000 MG CAPS Take 1 capsule by mouth daily.     Historical Provider, MD  omeprazole (PRILOSEC) 20 MG capsule Take 1 capsule by mouth daily. 09/10/13   Historical Provider, MD  potassium chloride (KLOR-CON) 8 MEQ tablet Take 8 mEq by mouth. Monday, Thursday    Historical Provider, MD  pravastatin (PRAVACHOL) 20 MG tablet Take 20 mg by mouth daily.    Historical Provider, MD  Respiratory Therapy Supplies (FLUTTER) DEVI Blow through 4 times, and do this 3 times daily when needed 01/27/12   Waymon Budgelinton D Young, MD  vitamin C (ASCORBIC ACID) 500 MG tablet Take 500 mg by mouth daily.      Historical Provider, MD   BP 174/99  Pulse 86  Temp(Src) 98.1 F (36.7 C) (Oral)  Resp 19  SpO2 98% Physical Exam  Nursing note and vitals reviewed. Constitutional: She is oriented to person, place, and time. She appears well-developed and well-nourished. No distress.  HENT:  Head: Normocephalic and atraumatic.  Eyes: Conjunctivae and EOM are normal. Pupils are equal, round, and reactive to light.  Neck: Neck supple. No JVD present. No tracheal deviation present.  Cardiovascular: Normal rate.  An irregular rhythm present.  Pulmonary/Chest: Effort normal. No respiratory distress. She has rales (bilaterally).  Abdominal: Soft. There is no tenderness.  Musculoskeletal: Normal range of motion. She exhibits edema (1+). She exhibits no tenderness.  No calf tenderness  Neurological: She is alert and oriented to person, place, and time.  Skin: Skin is warm and dry.  Ecchymosis on lateral aspect of left leg.  Psychiatric: She has a normal mood and affect. Her behavior is normal.    ED Course  Procedures (including critical care time) DIAGNOSTIC STUDIES: Oxygen Saturation is 98% on room air, normal by my interpretation.    COORDINATION OF CARE: 12:21 AM-Discussed treatment plan which includes labs with pt at bedside and pt  agreed to plan.   Labs Review Labs Reviewed  BASIC METABOLIC PANEL - Abnormal; Notable for the following:    Sodium 132 (*)    Chloride 94 (*)    GFR calc non Af Amer 73 (*)    GFR calc Af Amer 84 (*)    All other components within normal limits  PRO B NATRIURETIC PEPTIDE - Abnormal; Notable for the following:    Pro B Natriuretic peptide (BNP) 1678.0 (*)    All other components within normal limits  CBC WITH DIFFERENTIAL - Abnormal; Notable for the following:    RBC 3.65 (*)    Hemoglobin 11.6 (*)    HCT 33.8 (*)    All other components within normal limits    Imaging Review  Dg Chest 2 View  04/28/2014   CLINICAL DATA:  Lower extremity swelling. Shortness of breath for several days.  EXAM: CHEST  2 VIEW  COMPARISON:  Multiple priors dating back to of 06/07/2010.  FINDINGS: Chronic bronchitic changes. No airspace disease or effusion. Cholecystectomy clips are present in the right upper quadrant. Cardiopericardial silhouette within normal limits. Mediastinal contours are within normal limits and unchanged compared to prior.  IMPRESSION: No acute cardiopulmonary disease. Chronic bronchitic changes and basilar atelectasis.   Electronically Signed   By: Andreas Newport M.D.   On: 04/28/2014 01:39     EKG Interpretation None      MDM   Final diagnoses:  Bilateral leg edema  Pulmonary edema  Anemia  I personally performed the services described in this documentation, which was scribed in my presence. The recorded information has been reviewed and is accurate.  Clinically patient has bilateral lower extremities swelling and mild crackles at the bases without classic blood clot risk factors. I feel this is mild pulmonary edema however patient is vitals normalized in ER, she's not requiring oxygen and currently has no shortness of breath, chest pain or signs of endorgan damage. Oral Lasix given in the ER and close followup outpatient with short prescription for oral Lasix.  Results  and differential diagnosis were discussed with the patient/parent/guardian. Close follow up outpatient was discussed, comfortable with the plan.   Medications  furosemide (LASIX) tablet 20 mg (not administered)    Filed Vitals:   04/27/14 2350 04/28/14 0000 04/28/14 0100  BP: 174/99 128/86 110/89  Pulse: 86 73 73  Temp: 98.1 F (36.7 C)    TempSrc: Oral    Resp: 19  17  SpO2: 98% 99% 99%       Enid Skeens, MD 04/28/14 [phone]

## 2014-04-28 NOTE — Discharge Instructions (Signed)
Followup with your local Dr. to discuss having an ultrasound of your heart performed and a reassessment of the leg swelling and possibly checking her electrolytes as I have prescribed Lasix which is a diuretic to make you urinate.  Peripheral Edema You have swelling in your legs (peripheral edema). This swelling is due to excess accumulation of salt and water in your body. Edema may be a sign of heart, kidney or liver disease, or a side effect of a medication. It may also be due to problems in the leg veins. Elevating your legs and using special support stockings may be very helpful, if the cause of the swelling is due to poor venous circulation. Avoid long periods of standing, whatever the cause. Treatment of edema depends on identifying the cause. Chips, pretzels, pickles and other salty foods should be avoided. Restricting salt in your diet is almost always needed. Water pills (diuretics) are often used to remove the excess salt and water from your body via urine. These medicines prevent the kidney from reabsorbing sodium. This increases urine flow. Diuretic treatment may also result in lowering of potassium levels in your body. Potassium supplements may be needed if you have to use diuretics daily. Daily weights can help you keep track of your progress in clearing your edema. You should call your caregiver for follow up care as recommended. SEEK IMMEDIATE MEDICAL CARE IF:   You have increased swelling, pain, redness, or heat in your legs.  You develop shortness of breath, especially when lying down.  You develop chest or abdominal pain, weakness, or fainting.  You have a fever. Document Released: 12/05/2004 Document Revised: 01/20/2012 Document Reviewed: 11/15/2009 [employer] Patient Information 2015 [employer] [place]. This information is not intended to replace advice given to you by your health care provider. Make sure you discuss any questions you have with your health care provider.

## 2014-05-27 ENCOUNTER — Ambulatory Visit: Payer: Medicare Other | Admitting: Cardiovascular Disease

## 2014-06-13 ENCOUNTER — Ambulatory Visit: Payer: Medicare Other | Admitting: Cardiovascular Disease

## 2014-06-26 NOTE — Assessment & Plan Note (Signed)
Managed by cardiology. Watch for fluid overload

## 2014-06-26 NOTE — Assessment & Plan Note (Signed)
Well controlled, without complication at this time, but few crackles and ankle edema indicate fluid overload possibly from heart Plan-chest x-ray

## 2014-07-11 ENCOUNTER — Encounter: Payer: Self-pay | Admitting: Cardiovascular Disease

## 2014-07-11 ENCOUNTER — Ambulatory Visit (INDEPENDENT_AMBULATORY_CARE_PROVIDER_SITE_OTHER): Payer: Medicare Other | Admitting: Cardiovascular Disease

## 2014-07-11 VITALS — BP 136/74 | HR 100 | Ht 62.0 in | Wt 148.0 lb

## 2014-07-11 DIAGNOSIS — I482 Chronic atrial fibrillation, unspecified: Secondary | ICD-10-CM

## 2014-07-11 DIAGNOSIS — I4891 Unspecified atrial fibrillation: Secondary | ICD-10-CM

## 2014-07-11 DIAGNOSIS — R609 Edema, unspecified: Secondary | ICD-10-CM

## 2014-07-11 DIAGNOSIS — R600 Localized edema: Secondary | ICD-10-CM

## 2014-07-11 MED ORDER — FUROSEMIDE 40 MG PO TABS: 40.0000 mg | ORAL_TABLET | Freq: Every day | ORAL | Status: DC

## 2014-07-11 MED ORDER — POTASSIUM CHLORIDE CRYS ER 20 MEQ PO TBCR: 20.0000 meq | EXTENDED_RELEASE_TABLET | Freq: Every day | ORAL | Status: DC

## 2014-07-11 NOTE — Progress Notes (Signed)
Patient ID: Carrie Heath, female   DOB: 05-16-1921, 80 y.o.   MRN: [id number]      SUBJECTIVE: The patient is a 80 year old woman here for followup for her permanent atrial fibrillation. Her echocardiogram in May 2013 showed vigorous left ventricular systolic function, EF 70-75%, PFO with left to right shunting, with biatrial enlargement and moderate tricuspid regurgitation. She denies chest pain. She has chronic dyspnea which is unchanged. She has leg swelling and has been ordered compression stockings.   ECG in the office today shows atrial fibrillation with a heart rate of 78 beats per minute.  Review of Systems: As per "subjective", otherwise negative.  No Known Allergies  Current Outpatient Prescriptions  Medication Sig Dispense Refill  . albuterol (PROAIR HFA) 108 (90 BASE) MCG/ACT inhaler Inhale 2 puffs into the lungs every 4 (four) hours as needed for wheezing or shortness of breath.  8.5 g  11  . calcium citrate-vitamin D (CITRACAL+D) 315-200 MG-UNIT per tablet Take 1 tablet by mouth 2 (two) times daily.      . dabigatran (PRADAXA) 75 MG CAPS Take 1 capsule (75 mg total) by mouth every 12 (twelve) hours.  180 capsule  1  . furosemide (LASIX) 20 MG tablet Take 1 tablet (20 mg total) by mouth daily.  7 tablet  0  . gabapentin (NEURONTIN) 300 MG capsule Take 600 mg by mouth 3 (three) times daily.       . Glucosamine-Chondroit-Vit C-Mn (GLUCOSAMINE CHONDROITIN COMPLX) CAPS Take 1 capsule by mouth 2 (two) times daily.       Marland Kitchen ibuprofen (ADVIL,MOTRIN) 200 MG tablet 2 tabs by mouth twice daily, 1 at bedtime as needed      . ipratropium (ATROVENT) 0.02 % nebulizer solution Take 2.5 mLs (500 mcg total) by nebulization 4 (four) times daily. DX 496  300 mL  6  . metFORMIN (GLUCOPHAGE) 500 MG tablet Take 500 mg by mouth 2 (two) times daily with a meal.        . Multiple Vitamins-Minerals (MULTI COMPLETE PO) Take by mouth daily.      . Omega-3 Fatty Acids (FISH OIL) 1000 MG CAPS Take 1  capsule by mouth daily.       Marland Kitchen omeprazole (PRILOSEC) 20 MG capsule Take 1 capsule by mouth daily.      . potassium chloride (KLOR-CON) 8 MEQ tablet Take 8 mEq by mouth. Monday, Thursday      . pravastatin (PRAVACHOL) 20 MG tablet Take 20 mg by mouth daily.      Marland Kitchen Respiratory Therapy Supplies (FLUTTER) DEVI Blow through 4 times, and do this 3 times daily when needed  1 each  0  . vitamin C (ASCORBIC ACID) 500 MG tablet Take 500 mg by mouth daily.         No current facility-administered medications for this visit.    Past Medical History  Diagnosis Date  . DM type 2 (diabetes mellitus, type 2)   . Bronchitis   . COPD (chronic obstructive pulmonary disease)   . Hyperlipidemia   . HTN (hypertension)   . Neuropathy     Past Surgical History  Procedure Laterality Date  . Thyroidectomy, partial      remote  . Tonsillectomy    . Appendectomy    . Cholecystectomy    . Total abdominal hysterectomy    . Bladder surgery      History   Social History  . Marital Status: Widowed    Spouse Name: N/A  Number of Children: 2  . Years of Education: N/A   Occupational History  . retired    Social History Main Topics  . Smoking status: Former Smoker -- 0.50 packs/day    Types: Cigarettes    Start date: 07/11/1944    Quit date: 11/11/2006  . Smokeless tobacco: Not on file  . Alcohol Use: Not on file  . Drug Use: Not on file  . Sexual Activity: Not on file   Other Topics Concern  . 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  Lungs: Clear to auscultation bilaterally with normal respiratory effort.  CV: Nondisplaced PMI. Heart irregular rhythm, normal S1/S2, no S3, no murmur. 1-2+ pitting pretibial edema. No carotid bruit. Normal pedal pulses.  Abdomen: Soft, nontender, no hepatosplenomegaly, no distention.  Neurologic:  Alert and oriented x 3.  Psych: Normal affect.  Extremities: No clubbing or cyanosis.    ECG: Most recent ECG reviewed.    ASSESSMENT AND PLAN: 1. Permanent atrial fibrillation: HR controlled without AV nodal-slowing agents, and as she is asymptomatic. This is indicative of conduction system disease. Continue Pradaxa.   2. Bilateral leg swelling: Will increase Lasix to 40 mg daily and add KCl 20 meq daily and check a BMET in one week.  Dispo: f/u 1 year.  Prentice Docker, M.D., F.A.C.C.

## 2014-07-11 NOTE — Patient Instructions (Addendum)
Your physician wants you to follow-up in: 1 year You will receive a reminder letter in the mail two months in advance. If you don't receive a letter, please call our office to schedule the follow-up appointment.     Your physician has recommended you make the following change in your medication:    INCREASE Lasix to 40 mg daily   START Potassium 20 meq daily   Please have blood work Designer[employer]ellery) drawn in 1 week    Thank you for choosing [facility] !

## 2014-07-20 ENCOUNTER — Telehealth: Payer: Self-pay | Admitting: *Deleted

## 2014-07-20 NOTE — Telephone Encounter (Signed)
Received labs, in Dr. Koneswaran folder 

## 2014-07-26 ENCOUNTER — Encounter: Payer: Self-pay | Admitting: Cardiovascular Disease

## 2014-09-19 ENCOUNTER — Telehealth: Payer: Self-pay | Admitting: Internal Medicine

## 2014-09-19 NOTE — Telephone Encounter (Signed)
Spoke with pt's son and daughter-in-law, states that pt has been having more difficulty breathing since last Thursday- chest congestion, prod cough with yellow to green mucus, wheezing.  Son and daughter-in-law are in [place] and are not sure if pt is running a fever or what her 02 sats have been running.  Pt is taking no abx currently and is on 1mg  of prednisone daily.  [facility] and the PA there cannot prescribe anything more until they hear from CY.  Pt had a cxr from a mobile x-ray tech on Friday that was, per son, "inconclusive".  I've called the facility to hear from the nursing staff on the pt's status and am awaiting call back.  Pt's son and daughter are very concerned and want to know what CY recommends.  Last ov: 04/25/14 Next ov: 10/17/14.  CY please advise.  Thank you.   No Known Allergies Current Outpatient Prescriptions on File Prior to Visit  Medication Sig Dispense Refill  . albuterol (PROAIR HFA) 108 (90 BASE) MCG/ACT inhaler Inhale 2 puffs into the lungs every 4 (four) hours as needed for wheezing or shortness of breath. 8.5 g 11  . calcium citrate-vitamin D (CITRACAL+D) 315-200 MG-UNIT per tablet Take 1 tablet by mouth 2 (two) times daily.    . dabigatran (PRADAXA) 75 MG CAPS Take 1 capsule (75 mg total) by mouth every 12 (twelve) hours. 180 capsule 1  . furosemide (LASIX) 40 MG tablet Take 1 tablet (40 mg total) by mouth daily. 90 tablet 3  . gabapentin (NEURONTIN) 300 MG capsule Take 600 mg by mouth 3 (three) times daily.     . Glucosamine-Chondroit-Vit C-Mn (GLUCOSAMINE CHONDROITIN COMPLX) CAPS Take 1 capsule by mouth 2 (two) times daily.     Marland Kitchen. ibuprofen (ADVIL,MOTRIN) 200 MG tablet 2 tabs by mouth twice daily, 1 at bedtime as needed    . ipratropium (ATROVENT) 0.02 % nebulizer solution Take 2.5 mLs (500 mcg total) by nebulization 4 (four) times daily. DX 496 300 mL 6  . metFORMIN (GLUCOPHAGE) 500 MG tablet Take 500 mg by mouth 2 (two) times daily with a  meal.      . Multiple Vitamins-Minerals (MULTI COMPLETE PO) Take by mouth daily.    . Omega-3 Fatty Acids (FISH OIL) 1000 MG CAPS Take 1 capsule by mouth daily.     Marland Kitchen. omeprazole (PRILOSEC) 20 MG capsule Take 1 capsule by mouth daily.    . potassium chloride SA (K-DUR,KLOR-CON) 20 MEQ tablet Take 1 tablet (20 mEq total) by mouth daily. 90 tablet 3  . pravastatin (PRAVACHOL) 20 MG tablet Take 20 mg by mouth daily.    Marland Kitchen. Respiratory Therapy Supplies (FLUTTER) DEVI Blow through 4 times, and do this 3 times daily when needed 1 each 0  . vitamin C (ASCORBIC ACID) 500 MG tablet Take 500 mg by mouth daily.       No current facility-administered medications on file prior to visit.

## 2014-09-19 NOTE — Telephone Encounter (Signed)
DAUGHTER IS CALLING BACK AGAIN PT IS WORSE AT NIGHT THEY ARE IN [place]  CAN WE JUST SEND ORDERS SO SHE CAN GET BETTER PHYLLIS [place]  [phone] OR [phone]

## 2014-09-19 NOTE — Telephone Encounter (Signed)
Spoke with Marcelino DusterMichelle at the SNF I faxed order for zpack to her and notified of  CDY's recs  Faxed to [phone]

## 2014-09-19 NOTE — Telephone Encounter (Signed)
Suggest a Zpak If she gets too sick she may need to go to ER for evaluation

## 2014-09-19 NOTE — Telephone Encounter (Signed)
Called and spoke with Jamesetta SoPhyllis, pt daughter, aware of rec's per CY.  Christen ButterLeslie M Raskin, CMA at 09/19/2014 12:39 PM     Status: Signed       Expand All Collapse All   Spoke with Marcelino DusterMichelle at the SNF I faxed order for zpack to her and notified of CDY's recs  Faxed to [phone]               Waymon Budgelinton D Young, MD at 09/19/2014 12:11 PM     Status: Signed       Expand All Collapse All   Suggest a Zpak If she gets too sick she may need to go to ER for evaluation       Nothing further needed.

## 2014-09-19 NOTE — Telephone Encounter (Signed)
[facility], states she will have Marcelino DusterMichelle give us a call back.

## 2014-10-03 ENCOUNTER — Telehealth: Payer: Self-pay | Admitting: Internal Medicine

## 2014-10-03 NOTE — Telephone Encounter (Signed)
Ok- note that our med list didn't show her on prednisone 1 mg daily  Please send orders as requested, for the augmentin, and for prednisone 3 mg daily x 5 days, then return to 1 mg daily maintenance.

## 2014-10-03 NOTE — Telephone Encounter (Signed)
Called spoke with daughter and she is aware of recs. She then gave the [facility]. Per the nurse, we need to fax these orders over to them at [phone] and we have to put a stop date on the prednisone for 3 mg. Please advise Dr. Maple HudsonYoung so we can write this on order thanks

## 2014-10-03 NOTE — Telephone Encounter (Signed)
Order has been faxed. Nothing further needed 

## 2014-10-03 NOTE — Telephone Encounter (Signed)
Spoke with pt's daughter- states that pt has been coughing up yellow to green mucus since finishing zpack sent two weeks ago.  Pt also having wheezing and chest tightness with exertion.  No sinus congestion, no fever. Pt's daughter wants to know if taking Mucinex DM will help her.  Also wants to know if she needs an increased dose of prednisone.  Pt takes 1mg  prednisone daily.    CY please advise.  Thank you.  No Known Allergies Current Outpatient Prescriptions on File Prior to Visit  Medication Sig Dispense Refill  . albuterol (PROAIR HFA) 108 (90 BASE) MCG/ACT inhaler Inhale 2 puffs into the lungs every 4 (four) hours as needed for wheezing or shortness of breath. 8.5 g 11  . calcium citrate-vitamin D (CITRACAL+D) 315-200 MG-UNIT per tablet Take 1 tablet by mouth 2 (two) times daily.    . dabigatran (PRADAXA) 75 MG CAPS Take 1 capsule (75 mg total) by mouth every 12 (twelve) hours. 180 capsule 1  . furosemide (LASIX) 40 MG tablet Take 1 tablet (40 mg total) by mouth daily. 90 tablet 3  . gabapentin (NEURONTIN) 300 MG capsule Take 600 mg by mouth 3 (three) times daily.     . Glucosamine-Chondroit-Vit C-Mn (GLUCOSAMINE CHONDROITIN COMPLX) CAPS Take 1 capsule by mouth 2 (two) times daily.     Marland Kitchen. ibuprofen (ADVIL,MOTRIN) 200 MG tablet 2 tabs by mouth twice daily, 1 at bedtime as needed    . ipratropium (ATROVENT) 0.02 % nebulizer solution Take 2.5 mLs (500 mcg total) by nebulization 4 (four) times daily. DX 496 300 mL 6  . metFORMIN (GLUCOPHAGE) 500 MG tablet Take 500 mg by mouth 2 (two) times daily with a meal.      . Multiple Vitamins-Minerals (MULTI COMPLETE PO) Take by mouth daily.    . Omega-3 Fatty Acids (FISH OIL) 1000 MG CAPS Take 1 capsule by mouth daily.     Marland Kitchen. omeprazole (PRILOSEC) 20 MG capsule Take 1 capsule by mouth daily.    . potassium chloride SA (K-DUR,KLOR-CON) 20 MEQ tablet Take 1 tablet (20 mEq total) by mouth daily. 90 tablet 3  . pravastatin (PRAVACHOL) 20 MG tablet Take 20 mg  by mouth daily.    Marland Kitchen. Respiratory Therapy Supplies (FLUTTER) DEVI Blow through 4 times, and do this 3 times daily when needed 1 each 0  . vitamin C (ASCORBIC ACID) 500 MG tablet Take 500 mg by mouth daily.       No current facility-administered medications on file prior to visit.

## 2014-10-03 NOTE — Telephone Encounter (Signed)
If she is still coughing something with some infection, then yes- ok to use Mucinex  Ok to try increasing prednisone to 3 mg daily until feeling better  Ok to offer different antibiotic- augmentin 500 mg, # 14, 1 twice daily

## 2014-10-07 ENCOUNTER — Other Ambulatory Visit: Payer: Self-pay | Admitting: Cardiovascular Disease

## 2014-10-15 ENCOUNTER — Encounter (HOSPITAL_COMMUNITY): Payer: Self-pay | Admitting: *Deleted

## 2014-10-15 ENCOUNTER — Inpatient Hospital Stay (HOSPITAL_COMMUNITY)
Admission: EM | Admit: 2014-10-15 | Discharge: 2014-10-20 | DRG: 291 | Payer: Medicare Other | Attending: Internal Medicine | Admitting: Internal Medicine

## 2014-10-15 ENCOUNTER — Emergency Department (HOSPITAL_COMMUNITY): Payer: Medicare Other

## 2014-10-15 DIAGNOSIS — E871 Hypo-osmolality and hyponatremia: Secondary | ICD-10-CM | POA: Diagnosis present

## 2014-10-15 DIAGNOSIS — E785 Hyperlipidemia, unspecified: Secondary | ICD-10-CM | POA: Diagnosis present

## 2014-10-15 DIAGNOSIS — I482 Chronic atrial fibrillation: Secondary | ICD-10-CM | POA: Diagnosis present

## 2014-10-15 DIAGNOSIS — J440 Chronic obstructive pulmonary disease with acute lower respiratory infection: Secondary | ICD-10-CM | POA: Diagnosis present

## 2014-10-15 DIAGNOSIS — Z87891 Personal history of nicotine dependence: Secondary | ICD-10-CM

## 2014-10-15 DIAGNOSIS — I5033 Acute on chronic diastolic (congestive) heart failure: Principal | ICD-10-CM | POA: Diagnosis present

## 2014-10-15 DIAGNOSIS — T501X5A Adverse effect of loop [high-ceiling] diuretics, initial encounter: Secondary | ICD-10-CM | POA: Diagnosis present

## 2014-10-15 DIAGNOSIS — Z90710 Acquired absence of both cervix and uterus: Secondary | ICD-10-CM | POA: Diagnosis not present

## 2014-10-15 DIAGNOSIS — I10 Essential (primary) hypertension: Secondary | ICD-10-CM | POA: Diagnosis present

## 2014-10-15 DIAGNOSIS — E1142 Type 2 diabetes mellitus with diabetic polyneuropathy: Secondary | ICD-10-CM | POA: Diagnosis present

## 2014-10-15 DIAGNOSIS — G629 Polyneuropathy, unspecified: Secondary | ICD-10-CM | POA: Diagnosis present

## 2014-10-15 DIAGNOSIS — M25551 Pain in right hip: Secondary | ICD-10-CM

## 2014-10-15 DIAGNOSIS — M25552 Pain in left hip: Secondary | ICD-10-CM

## 2014-10-15 DIAGNOSIS — J9601 Acute respiratory failure with hypoxia: Secondary | ICD-10-CM | POA: Diagnosis present

## 2014-10-15 DIAGNOSIS — Z7952 Long term (current) use of systemic steroids: Secondary | ICD-10-CM | POA: Diagnosis not present

## 2014-10-15 DIAGNOSIS — R0602 Shortness of breath: Secondary | ICD-10-CM | POA: Diagnosis present

## 2014-10-15 DIAGNOSIS — Z66 Do not resuscitate: Secondary | ICD-10-CM | POA: Diagnosis present

## 2014-10-15 DIAGNOSIS — E876 Hypokalemia: Secondary | ICD-10-CM | POA: Diagnosis present

## 2014-10-15 DIAGNOSIS — J209 Acute bronchitis, unspecified: Secondary | ICD-10-CM | POA: Diagnosis present

## 2014-10-15 DIAGNOSIS — R29898 Other symptoms and signs involving the musculoskeletal system: Secondary | ICD-10-CM

## 2014-10-15 DIAGNOSIS — Q211 Atrial septal defect: Secondary | ICD-10-CM | POA: Diagnosis not present

## 2014-10-15 DIAGNOSIS — Z9049 Acquired absence of other specified parts of digestive tract: Secondary | ICD-10-CM | POA: Diagnosis present

## 2014-10-15 DIAGNOSIS — D72829 Elevated white blood cell count, unspecified: Secondary | ICD-10-CM | POA: Diagnosis present

## 2014-10-15 DIAGNOSIS — R05 Cough: Secondary | ICD-10-CM

## 2014-10-15 DIAGNOSIS — R059 Cough, unspecified: Secondary | ICD-10-CM

## 2014-10-15 DIAGNOSIS — I4891 Unspecified atrial fibrillation: Secondary | ICD-10-CM | POA: Diagnosis present

## 2014-10-15 DIAGNOSIS — Z79899 Other long term (current) drug therapy: Secondary | ICD-10-CM | POA: Diagnosis not present

## 2014-10-15 DIAGNOSIS — J441 Chronic obstructive pulmonary disease with (acute) exacerbation: Secondary | ICD-10-CM | POA: Diagnosis present

## 2014-10-15 HISTORY — DX: Chronic atrial fibrillation, unspecified: I48.20

## 2014-10-15 LAB — CBC WITH DIFFERENTIAL/PLATELET
BASOS ABS: 0.1 10*3/uL (ref 0.0–0.1)
BASOS PCT: 1 % (ref 0–1)
EOS ABS: 0.7 10*3/uL (ref 0.0–0.7)
Eosinophils Relative: 5 % (ref 0–5)
HCT: 39.2 % (ref 36.0–46.0)
Hemoglobin: 13 g/dL (ref 12.0–15.0)
Lymphocytes Relative: 14 % (ref 12–46)
Lymphs Abs: 1.7 10*3/uL (ref 0.7–4.0)
MCH: 31.4 pg (ref 26.0–34.0)
MCHC: 33.2 g/dL (ref 30.0–36.0)
MCV: 94.7 fL (ref 78.0–100.0)
MONOS PCT: 6 % (ref 3–12)
Monocytes Absolute: 0.7 10*3/uL (ref 0.1–1.0)
NEUTROS PCT: 74 % (ref 43–77)
Neutro Abs: 9 10*3/uL — ABNORMAL HIGH (ref 1.7–7.7)
Platelets: 246 10*3/uL (ref 150–400)
RBC: 4.14 MIL/uL (ref 3.87–5.11)
RDW: 13.7 % (ref 11.5–15.5)
WBC: 12.2 10*3/uL — ABNORMAL HIGH (ref 4.0–10.5)

## 2014-10-15 LAB — COMPREHENSIVE METABOLIC PANEL
ALBUMIN: 3.9 g/dL (ref 3.5–5.2)
ALT: 13 U/L (ref 0–35)
AST: 21 U/L (ref 0–37)
Alkaline Phosphatase: 91 U/L (ref 39–117)
Anion gap: 13 (ref 5–15)
BILIRUBIN TOTAL: 0.3 mg/dL (ref 0.3–1.2)
BUN: 12 mg/dL (ref 6–23)
CHLORIDE: 88 meq/L — AB (ref 96–112)
CO2: 32 mEq/L (ref 19–32)
CREATININE: 0.73 mg/dL (ref 0.50–1.10)
Calcium: 9.1 mg/dL (ref 8.4–10.5)
GFR calc Af Amer: 84 mL/min — ABNORMAL LOW (ref >90)
GFR calc non Af Amer: 72 mL/min — ABNORMAL LOW (ref >90)
Glucose, Bld: 132 mg/dL — ABNORMAL HIGH (ref 70–99)
POTASSIUM: 4.2 meq/L (ref 3.7–5.3)
Sodium: 133 mEq/L — ABNORMAL LOW (ref 137–147)
TOTAL PROTEIN: 7.6 g/dL (ref 6.0–8.3)

## 2014-10-15 LAB — PRO B NATRIURETIC PEPTIDE: Pro B Natriuretic peptide (BNP): 2147 pg/mL — ABNORMAL HIGH (ref 0–450)

## 2014-10-15 LAB — TROPONIN I: Troponin I: <0.3 ng/mL (ref <0.30)

## 2014-10-15 MED ORDER — ALBUTEROL SULFATE (2.5 MG/3ML) 0.083% IN NEBU: 5.0000 mg | INHALATION_SOLUTION | Freq: Once | RESPIRATORY_TRACT | Status: DC

## 2014-10-15 MED ORDER — FUROSEMIDE 10 MG/ML IJ SOLN
40.0000 mg | Freq: Once | INTRAMUSCULAR | Status: AC
  Administered 2014-10-15: 40 mg via INTRAVENOUS
  Filled 2014-10-15: qty 4

## 2014-10-15 MED ORDER — ALBUTEROL SULFATE (2.5 MG/3ML) 0.083% IN NEBU
2.5000 mg | INHALATION_SOLUTION | Freq: Once | RESPIRATORY_TRACT | Status: AC
  Administered 2014-10-15: 2.5 mg via RESPIRATORY_TRACT
  Filled 2014-10-15: qty 3

## 2014-10-15 MED ORDER — IPRATROPIUM BROMIDE 0.02 % IN SOLN: 0.5000 mg | Freq: Once | RESPIRATORY_TRACT | Status: DC

## 2014-10-15 MED ORDER — DILTIAZEM HCL 25 MG/5ML IV SOLN
10.0000 mg | Freq: Once | INTRAVENOUS | Status: AC
  Administered 2014-10-15: 10 mg via INTRAVENOUS
  Filled 2014-10-15: qty 5

## 2014-10-15 MED ORDER — DEXTROSE 5 % IV SOLN
5.0000 mg/h | Freq: Once | INTRAVENOUS | Status: AC
  Administered 2014-10-16: 5 mg/h via INTRAVENOUS
  Filled 2014-10-15: qty 100

## 2014-10-15 MED ORDER — IPRATROPIUM-ALBUTEROL 0.5-2.5 (3) MG/3ML IN SOLN
3.0000 mL | Freq: Once | RESPIRATORY_TRACT | Status: AC
  Administered 2014-10-15: 3 mL via RESPIRATORY_TRACT
  Filled 2014-10-15: qty 3

## 2014-10-15 NOTE — ED Notes (Signed)
Pt brought in by [facility] for c/o sob that has progressively worse over last 3 days; pt denies any pain just sob; pt states she is not able to cough up anything

## 2014-10-15 NOTE — ED Provider Notes (Signed)
CSN: [id number]     Arrival date & time 10/15/14  2123 History  This chart was scribed for Benny Lennert, MD by Ronney Lion, ED Scribe. This patient was seen in room APA02/APA02 and the patient's care was started at 9:34 PM.    Chief Complaint  Patient presents with  . Shortness of Breath   Patient is a 80 y.o. female presenting with shortness of breath. The history is provided by the patient and medical records. No language interpreter was used.  Shortness of Breath Severity:  Moderate Timing:  Constant Progression:  Worsening Associated symptoms: cough   Associated symptoms: no abdominal pain, no chest pain, no fever, no headaches and no rash   Risk factors: tobacco use (history of)     HPI Comments: DELORES EDELSTEIN is a 80 y.o. female who presents to the Emergency Department complaining of SOB that began to worsen 3 days ago, per medical records. She complains of associated non-productive coughing. She says that she receives breathing treatments at the [facility], an assisted living facility. She says her SOB has been relieved by treatment here. Patient denies fever and chills.   Past Medical History  Diagnosis Date  . DM type 2 (diabetes mellitus, type 2)   . Bronchitis   . COPD (chronic obstructive pulmonary disease)   . Hyperlipidemia   . HTN (hypertension)   . Neuropathy    Past Surgical History  Procedure Laterality Date  . Thyroidectomy, partial      remote  . Tonsillectomy    . Appendectomy    . Cholecystectomy    . Total abdominal hysterectomy    . Bladder surgery     Family History  Problem Relation Age of Onset  . Heart disease Father   . Heart disease Brother     CABG  . Alzheimer's disease Mother    History  Substance Use Topics  . Smoking status: Former Smoker -- 0.50 packs/day    Types: Cigarettes    Start date: 07/11/1944    Quit date: 11/11/2006  . Smokeless tobacco: Not on file  . Alcohol Use: No   OB History    No data available      Review of Systems  Constitutional: Negative for fever, chills, appetite change and fatigue.  HENT: Negative for congestion, ear discharge and sinus pressure.   Eyes: Negative for discharge.  Respiratory: Positive for cough and shortness of breath.   Cardiovascular: Negative for chest pain.  Gastrointestinal: Negative for abdominal pain and diarrhea.  Genitourinary: Negative for frequency and hematuria.  Musculoskeletal: Negative for back pain.  Skin: Negative for rash.  Neurological: Negative for seizures and headaches.  Psychiatric/Behavioral: Negative for hallucinations.      Allergies  Review of patient's allergies indicates no known allergies.  Home Medications   Prior to Admission medications   Medication Sig Start Date End Date Taking? Authorizing Provider  albuterol (PROAIR HFA) 108 (90 BASE) MCG/ACT inhaler Inhale 2 puffs into the lungs every 4 (four) hours as needed for wheezing or shortness of breath. 10/15/13 12/02/14  Waymon Budge, MD  calcium citrate-vitamin D (CITRACAL+D) 315-200 MG-UNIT per tablet Take 1 tablet by mouth 2 (two) times daily.    Historical Provider, MD  dabigatran (PRADAXA) 75 MG CAPS Take 1 capsule (75 mg total) by mouth every 12 (twelve) hours. 12/02/12   Gaylord Shih, MD  furosemide (LASIX) 40 MG tablet TAKE 1 TABLET BY MOUTH ONCE DAILY. 10/10/14   Laqueta Linden, MD  gabapentin (NEURONTIN) 300 MG capsule Take 600 mg by mouth 3 (three) times daily.     Historical Provider, MD  Glucosamine-Chondroit-Vit C-Mn (GLUCOSAMINE CHONDROITIN COMPLX) CAPS Take 1 capsule by mouth 2 (two) times daily.     Historical Provider, MD  ibuprofen (ADVIL,MOTRIN) 200 MG tablet 2 tabs by mouth twice daily, 1 at bedtime as needed    Historical Provider, MD  ipratropium (ATROVENT) 0.02 % nebulizer solution Take 2.5 mLs (500 mcg total) by nebulization 4 (four) times daily. DX 496 01/07/14   Waymon Budgelinton D Young, MD  metFORMIN (GLUCOPHAGE) 500 MG tablet Take 500 mg by mouth 2  (two) times daily with a meal.      Historical Provider, MD  Multiple Vitamins-Minerals (MULTI COMPLETE PO) Take by mouth daily.    Historical Provider, MD  Omega-3 Fatty Acids (FISH OIL) 1000 MG CAPS Take 1 capsule by mouth daily.     Historical Provider, MD  omeprazole (PRILOSEC) 20 MG capsule Take 1 capsule by mouth daily. 09/10/13   Historical Provider, MD  potassium chloride SA (K-DUR,KLOR-CON) 20 MEQ tablet TAKE 1 TABLET BY MOUTH ONCE DAILY. 10/10/14   Laqueta LindenSuresh A Koneswaran, MD  pravastatin (PRAVACHOL) 20 MG tablet Take 20 mg by mouth daily.    Historical Provider, MD  Respiratory Therapy Supplies (FLUTTER) DEVI Blow through 4 times, and do this 3 times daily when needed 01/27/12   Waymon Budgelinton D Young, MD  vitamin C (ASCORBIC ACID) 500 MG tablet Take 500 mg by mouth daily.      Historical Provider, MD   There were no vitals taken for this visit. Physical Exam  Constitutional: She is oriented to person, place, and time. She appears well-developed.  HENT:  Head: Normocephalic.  Eyes: Conjunctivae and EOM are normal. No scleral icterus.  Neck: Neck supple. No thyromegaly present.  Cardiovascular: Normal rate and regular rhythm.  Exam reveals no gallop and no friction rub.   No murmur heard. 2+ edema in bilateral ankles.  Pulmonary/Chest: No stridor. She has no wheezes. She has no rales. She exhibits no tenderness.  Moderate wheezing bilaterally.  Abdominal: She exhibits no distension. There is no tenderness. There is no rebound.  Musculoskeletal: Normal range of motion. She exhibits no edema.  Lymphadenopathy:    She has no cervical adenopathy.  Neurological: She is oriented to person, place, and time. She exhibits normal muscle tone. Coordination normal.  Skin: No rash noted. No erythema.  Psychiatric: She has a normal mood and affect. Her behavior is normal.  Nursing note and vitals reviewed.   ED Course  Procedures (including critical care time)  DIAGNOSTIC STUDIES: Oxygen  Saturation is 90% on RA, adequate by my interpretation.    COORDINATION OF CARE: 9:36 PM - Discussed treatment plan with pt at bedside and pt agreed to plan.    Labs Review Labs Reviewed - No data to display  Imaging Review No results found.   EKG Interpretation None     CRITICAL CARE Performed by: Laksh Hinners L Total critical care time: 40 Critical care time was exclusive of separately billable procedures and treating other patients. Critical care was necessary to treat or prevent imminent or life-threatening deterioration. Critical care was time spent personally by me on the following activities: development of treatment plan with patient and/or surrogate as well as nursing, discussions with consultants, evaluation of patient's response to treatment, examination of patient, obtaining history from patient or surrogate, ordering and performing treatments and interventions, ordering and review of laboratory studies, ordering and review  of radiographic studies, pulse oximetry and re-evaluation of patient's condition.   MDM   Final diagnoses:  None      Benny LennertJoseph L Deaundra Dupriest, MD 10/16/14 0000

## 2014-10-15 NOTE — ED Notes (Signed)
Pt was given 125mg  of solumedrol, 5mg  albuterol, 0.5mg  atrovent neb treatment en route to the hospital

## 2014-10-16 ENCOUNTER — Encounter (HOSPITAL_COMMUNITY): Payer: Self-pay | Admitting: *Deleted

## 2014-10-16 ENCOUNTER — Inpatient Hospital Stay (HOSPITAL_COMMUNITY): Payer: Medicare Other

## 2014-10-16 DIAGNOSIS — I509 Heart failure, unspecified: Secondary | ICD-10-CM

## 2014-10-16 DIAGNOSIS — I4891 Unspecified atrial fibrillation: Secondary | ICD-10-CM

## 2014-10-16 LAB — GLUCOSE, CAPILLARY
GLUCOSE-CAPILLARY: 131 mg/dL — AB (ref 70–99)
GLUCOSE-CAPILLARY: 58 mg/dL — AB (ref 70–99)
GLUCOSE-CAPILLARY: 68 mg/dL — AB (ref 70–99)
GLUCOSE-CAPILLARY: 145 mg/dL — AB (ref 70–99)
Glucose-Capillary: 227 mg/dL — ABNORMAL HIGH (ref 70–99)

## 2014-10-16 LAB — TROPONIN I
Troponin I: <0.3 ng/mL (ref <0.30)
Troponin I: <0.3 ng/mL (ref <0.30)
Troponin I: <0.3 ng/mL (ref <0.30)

## 2014-10-16 LAB — MRSA PCR SCREENING: MRSA by PCR: POSITIVE — AB

## 2014-10-16 LAB — TSH: TSH: 1.58 u[IU]/mL (ref 0.350–4.500)

## 2014-10-16 MED ORDER — DABIGATRAN ETEXILATE MESYLATE 75 MG PO CAPS
ORAL_CAPSULE | ORAL | Status: AC
  Filled 2014-10-16: qty 1

## 2014-10-16 MED ORDER — CETYLPYRIDINIUM CHLORIDE 0.05 % MT LIQD
7.0000 mL | Freq: Two times a day (BID) | OROMUCOSAL | Status: DC
  Administered 2014-10-16 – 2014-10-20 (×10): 7 mL via OROMUCOSAL

## 2014-10-16 MED ORDER — IPRATROPIUM-ALBUTEROL 0.5-2.5 (3) MG/3ML IN SOLN: 3.0000 mL | RESPIRATORY_TRACT | Status: DC | PRN

## 2014-10-16 MED ORDER — ONDANSETRON HCL 4 MG/2ML IJ SOLN: 4.0000 mg | Freq: Four times a day (QID) | INTRAMUSCULAR | Status: DC | PRN

## 2014-10-16 MED ORDER — ASPIRIN EC 81 MG PO TBEC
81.0000 mg | DELAYED_RELEASE_TABLET | Freq: Every day | ORAL | Status: DC
  Administered 2014-10-16 – 2014-10-18 (×3): 81 mg via ORAL
  Filled 2014-10-16 (×3): qty 1

## 2014-10-16 MED ORDER — SODIUM CHLORIDE 0.9 % IJ SOLN
3.0000 mL | Freq: Two times a day (BID) | INTRAMUSCULAR | Status: DC
  Administered 2014-10-17 – 2014-10-20 (×7): 3 mL via INTRAVENOUS

## 2014-10-16 MED ORDER — DILTIAZEM HCL 30 MG PO TABS
30.0000 mg | ORAL_TABLET | Freq: Three times a day (TID) | ORAL | Status: DC
  Administered 2014-10-16 – 2014-10-18 (×6): 30 mg via ORAL
  Filled 2014-10-16 (×6): qty 1

## 2014-10-16 MED ORDER — LEVALBUTEROL HCL 1.25 MG/0.5ML IN NEBU
1.2500 mg | INHALATION_SOLUTION | Freq: Four times a day (QID) | RESPIRATORY_TRACT | Status: DC | PRN
  Administered 2014-10-16 (×2): 1.25 mg via RESPIRATORY_TRACT
  Filled 2014-10-16 (×2): qty 0.5

## 2014-10-16 MED ORDER — FUROSEMIDE 10 MG/ML IJ SOLN
40.0000 mg | Freq: Two times a day (BID) | INTRAMUSCULAR | Status: DC
  Administered 2014-10-16 – 2014-10-18 (×5): 40 mg via INTRAVENOUS
  Filled 2014-10-16 (×5): qty 4

## 2014-10-16 MED ORDER — SODIUM CHLORIDE 0.9 % IV SOLN
250.0000 mL | INTRAVENOUS | Status: DC | PRN
  Administered 2014-10-18: 250 mL via INTRAVENOUS

## 2014-10-16 MED ORDER — ACETAMINOPHEN 325 MG PO TABS: 650.0000 mg | ORAL_TABLET | ORAL | Status: DC | PRN

## 2014-10-16 MED ORDER — GABAPENTIN 300 MG PO CAPS
600.0000 mg | ORAL_CAPSULE | ORAL | Status: AC
  Administered 2014-10-16: 600 mg via ORAL
  Filled 2014-10-16: qty 2

## 2014-10-16 MED ORDER — DILTIAZEM HCL 100 MG IV SOLR
5.0000 mg/h | INTRAVENOUS | Status: DC
  Administered 2014-10-16: 7.5 mg/h via INTRAVENOUS
  Filled 2014-10-16: qty 100

## 2014-10-16 MED ORDER — SODIUM CHLORIDE 0.9 % IJ SOLN: 3.0000 mL | INTRAMUSCULAR | Status: DC | PRN

## 2014-10-16 MED ORDER — CHLORHEXIDINE GLUCONATE CLOTH 2 % EX PADS
6.0000 | MEDICATED_PAD | Freq: Every day | CUTANEOUS | Status: AC
  Administered 2014-10-16 – 2014-10-20 (×5): 6 via TOPICAL

## 2014-10-16 MED ORDER — INSULIN ASPART 100 UNIT/ML ~~LOC~~ SOLN
0.0000 [IU] | Freq: Three times a day (TID) | SUBCUTANEOUS | Status: DC
  Administered 2014-10-16: 3 [IU] via SUBCUTANEOUS
  Administered 2014-10-16: 1 [IU] via SUBCUTANEOUS
  Administered 2014-10-17: 2 [IU] via SUBCUTANEOUS
  Administered 2014-10-18: 1 [IU] via SUBCUTANEOUS
  Administered 2014-10-18 – 2014-10-19 (×2): 2 [IU] via SUBCUTANEOUS
  Administered 2014-10-19: 1 [IU] via SUBCUTANEOUS

## 2014-10-16 MED ORDER — DABIGATRAN ETEXILATE MESYLATE 75 MG PO CAPS
75.0000 mg | ORAL_CAPSULE | Freq: Two times a day (BID) | ORAL | Status: DC
  Administered 2014-10-16 – 2014-10-20 (×11): 75 mg via ORAL
  Filled 2014-10-16 (×12): qty 1

## 2014-10-16 MED ORDER — GABAPENTIN 300 MG PO CAPS
600.0000 mg | ORAL_CAPSULE | Freq: Four times a day (QID) | ORAL | Status: DC
  Administered 2014-10-16 – 2014-10-20 (×20): 600 mg via ORAL
  Filled 2014-10-16 (×20): qty 2

## 2014-10-16 MED ORDER — MUPIROCIN 2 % EX OINT
1.0000 "application " | TOPICAL_OINTMENT | Freq: Two times a day (BID) | CUTANEOUS | Status: DC
  Administered 2014-10-16 – 2014-10-20 (×9): 1 via NASAL
  Filled 2014-10-16 (×2): qty 22

## 2014-10-16 MED ORDER — PREDNISONE 5 MG PO TABS
2.5000 mg | ORAL_TABLET | Freq: Every day | ORAL | Status: DC
  Administered 2014-10-16 – 2014-10-18 (×3): 2.5 mg via ORAL
  Filled 2014-10-16 (×2): qty 0.5
  Filled 2014-10-16 (×2): qty 1

## 2014-10-16 MED ORDER — SODIUM CHLORIDE 0.9 % IN NEBU
INHALATION_SOLUTION | RESPIRATORY_TRACT | Status: AC
  Administered 2014-10-16: 3 mL
  Filled 2014-10-16: qty 3

## 2014-10-16 MED ORDER — ALBUTEROL SULFATE (2.5 MG/3ML) 0.083% IN NEBU: 3.0000 mL | INHALATION_SOLUTION | RESPIRATORY_TRACT | Status: DC | PRN

## 2014-10-16 MED ORDER — INSULIN ASPART 100 UNIT/ML ~~LOC~~ SOLN
3.0000 [IU] | Freq: Three times a day (TID) | SUBCUTANEOUS | Status: DC
  Administered 2014-10-16 – 2014-10-19 (×10): 3 [IU] via SUBCUTANEOUS

## 2014-10-16 MED ORDER — POTASSIUM CHLORIDE CRYS ER 20 MEQ PO TBCR
20.0000 meq | EXTENDED_RELEASE_TABLET | Freq: Every day | ORAL | Status: DC
  Administered 2014-10-16 – 2014-10-19 (×4): 20 meq via ORAL
  Filled 2014-10-16 (×4): qty 1

## 2014-10-16 MED ORDER — GABAPENTIN 600 MG PO TABS
600.0000 mg | ORAL_TABLET | Freq: Four times a day (QID) | ORAL | Status: DC
  Filled 2014-10-16 (×6): qty 1

## 2014-10-16 NOTE — Progress Notes (Signed)
PT NOW HAS COUGH PRODUCTIVE OF YELLOW/GREEN THICK SPUTUM.

## 2014-10-16 NOTE — Plan of Care (Signed)
Problem: Phase I Progression Outcomes Goal: Pain controlled with appropriate interventions Outcome: Progressing Goal: Hemodynamically stable Outcome: Progressing     

## 2014-10-16 NOTE — Progress Notes (Signed)
  Echocardiogram 2D Echocardiogram has been performed.  Johnathan HausenMUCKER, Pleshette Tomasini M 10/16/2014, 10:06 AM

## 2014-10-16 NOTE — Plan of Care (Signed)
Problem: Phase I Progression Outcomes Goal: Pain controlled with appropriate interventions Outcome: Progressing Goal: Voiding-avoid urinary catheter unless indicated Outcome: Progressing On 40 mg IV lasix every 12 hours; no foley at this time. Goal: Hemodynamically stable Outcome: Progressing

## 2014-10-16 NOTE — H&P (Signed)
PCP:   Benita StabileHALL,[name]   Chief Complaint:  sob  HPI: 80 yo female h/o copd, dm, ??chf (reports she is on lasix for her leg swelling), htn comes in with sudden onset of sob tonight while she was lying down.  She says she just "couldnt' catch her breath".  Denies cp or palpirations.  Cannot lie down flat or her sob gets worse.  She does have a cough that is worse with lying down.  No fevers.  No chills.  She does also have le edema which she says has been there since July and has not changed.  She feels better with lasix and oxygen given to her in the ED.  She has diuresed some, not measured in the ED.  Lives in assisted living.  Review of Systems:  Positive and negative as per HPI otherwise all other systems are negative  Past Medical History: Past Medical History  Diagnosis Date  . DM type 2 (diabetes mellitus, type 2)   . Bronchitis   . COPD (chronic obstructive pulmonary disease)   . Hyperlipidemia   . HTN (hypertension)   . Neuropathy    Past Surgical History  Procedure Laterality Date  . Thyroidectomy, partial      remote  . Tonsillectomy    . Appendectomy    . Cholecystectomy    . Total abdominal hysterectomy    . Bladder surgery      Medications: Prior to Admission medications   Medication Sig Start Date End Date Taking? Authorizing Provider  CALCIUM CITRATE PO Take 2 tablets by mouth daily.   Yes Historical Provider, MD  dabigatran (PRADAXA) 75 MG CAPS Take 1 capsule (75 mg total) by mouth every 12 (twelve) hours. 12/02/12  Yes Gaylord Shihhomas C Wall, MD  furosemide (LASIX) 40 MG tablet Take 40 mg by mouth daily.   Yes Historical Provider, MD  gabapentin (NEURONTIN) 600 MG tablet Take 600 mg by mouth 4 (four) times daily.   Yes Historical Provider, MD  Glucosamine-Chondroit-Vit C-Mn (GLUCOSAMINE CHONDROITIN COMPLX) CAPS Take 3 capsules by mouth 2 (two) times daily.    Yes Historical Provider, MD  guaiFENesin (MUCINEX) 600 MG 12 hr tablet Take 600 mg by mouth 2 (two) times daily.    Yes Historical Provider, MD  ibuprofen (ADVIL,MOTRIN) 200 MG tablet Take 400 mg by mouth 3 (three) times daily as needed (pain).   Yes Historical Provider, MD  ipratropium-albuterol (DUONEB) 0.5-2.5 (3) MG/3ML SOLN Take 3 mLs by nebulization every 3 (three) hours as needed (shortness of breath).   Yes Historical Provider, MD  metFORMIN (GLUCOPHAGE) 500 MG tablet Take 500 mg by mouth 2 (two) times daily with a meal.     Yes Historical Provider, MD  Multiple Vitamins-Minerals (ABC PLUS SENIOR) TABS Take 1 tablet by mouth daily.   Yes Historical Provider, MD  Multiple Vitamins-Minerals (MULTI COMPLETE PO) Take by mouth daily.   Yes Historical Provider, MD  Omega-3 Fatty Acids (FISH OIL) 1000 MG CAPS Take 1 capsule by mouth daily.    Yes Historical Provider, MD  omeprazole (PRILOSEC) 20 MG capsule Take 1 capsule by mouth daily. 09/10/13  Yes Historical Provider, MD  potassium chloride SA (K-DUR,KLOR-CON) 20 MEQ tablet Take 20 mEq by mouth daily.   Yes Historical Provider, MD  predniSONE (DELTASONE) 2.5 MG tablet Take 2.5 mg by mouth daily with breakfast.   Yes Historical Provider, MD  traMADol (ULTRAM) 50 MG tablet Take 25 mg by mouth 2 (two) times daily as needed (pain).  Yes Historical Provider, MD  albuterol (PROAIR HFA) 108 (90 BASE) MCG/ACT inhaler Inhale 2 puffs into the lungs every 4 (four) hours as needed for wheezing or shortness of breath. 10/15/13 12/02/14  Waymon Budgelinton D Young, MD  furosemide (LASIX) 40 MG tablet TAKE 1 TABLET BY MOUTH ONCE DAILY. Patient not taking: Reported on 10/15/2014 10/10/14   Laqueta LindenSuresh A Koneswaran, MD  ipratropium (ATROVENT) 0.02 % nebulizer solution Take 2.5 mLs (500 mcg total) by nebulization 4 (four) times daily. DX 496 Patient not taking: Reported on 10/15/2014 01/07/14   Waymon Budgelinton D Young, MD  potassium chloride SA (K-DUR,KLOR-CON) 20 MEQ tablet TAKE 1 TABLET BY MOUTH ONCE DAILY. Patient not taking: Reported on 10/15/2014 10/10/14   Laqueta LindenSuresh A Koneswaran, MD  Respiratory  Therapy Supplies (FLUTTER) DEVI Blow through 4 times, and do this 3 times daily when needed Patient not taking: Reported on 10/15/2014 01/27/12   Waymon Budgelinton D Young, MD    Allergies:  No Known Allergies  Social History:  reports that she quit smoking about 7 years ago. Her smoking use included Cigarettes. She started smoking about 70 years ago. She smoked 0.50 packs per day. She does not have any smokeless tobacco history on file. She reports that she does not drink alcohol or use illicit drugs.  Family History: Family History  Problem Relation Age of Onset  . Heart disease Father   . Heart disease Brother     CABG  . Alzheimer's disease Mother     Physical Exam: Filed Vitals:   10/15/14 2144 10/15/14 2152  BP: 163/91   Pulse: 109   Temp: 98.1 F (36.7 C)   TempSrc: Oral   Resp: 22   SpO2: 90% 96%   General appearance: alert, cooperative and mild distress Head: Normocephalic, without obvious abnormality, atraumatic Eyes: negative Nose: Nares normal. Septum midline. Mucosa normal. No drainage or sinus tenderness. Neck: no JVD and supple, symmetrical, trachea midline Lungs: rhonchi bibasilar and wheezes bibasilar Heart: irregularly irregular rhythm Abdomen: soft, non-tender; bowel sounds normal; no masses,  no organomegaly Extremities: edema 2+ Pulses: 2+ and symmetric Skin: Skin color, texture, turgor normal. No rashes or lesions Neurologic: Grossly normal    Labs on Admission:   Recent Labs  10/15/14 2217  NA 133*  K 4.2  CL 88*  CO2 32  GLUCOSE 132*  BUN 12  CREATININE 0.73  CALCIUM 9.1    Recent Labs  10/15/14 2217  AST 21  ALT 13  ALKPHOS 91  BILITOT 0.3  PROT 7.6  ALBUMIN 3.9    Recent Labs  10/15/14 2217  WBC 12.2*  NEUTROABS 9.0*  HGB 13.0  HCT 39.2  MCV 94.7  PLT 246    Recent Labs  10/15/14 2217  TROPONINI <0.30   Radiological Exams on Admission: Dg Chest Portable 1 View  10/15/2014   CLINICAL DATA:  Acute onset of shortness  of breath and weakness for 3 days, progressively worsening. Nonproductive cough. Initial encounter.  EXAM: PORTABLE CHEST - 1 VIEW  COMPARISON:  Chest radiograph performed 04/28/2014  FINDINGS: The lungs are well-aerated. Vascular congestion is noted. Increased interstitial markings raise concern for mild interstitial edema. A small left pleural effusion is suspected. No pneumothorax is seen.  The cardiomediastinal silhouette is borderline normal in size. No acute osseous abnormalities are seen.  IMPRESSION: Vascular congestion noted. Increased interstitial markings raise concern for mild interstitial edema. Suspect small left pleural effusion.   Electronically Signed   By: Roanna RaiderJeffery  Chang M.D.   On: 10/15/2014 22:39  Assessment/Plan  80 yo female with afib with rvr and chf exacerbation  Principal Problem:   CHF exacerbation-  Probable diastolic.  May be rate related.  Control rate.  Lasix 40mg  iv q 12 hours.  Monitor cr closely.  Accurate i/os and daily wts.  chf pathway.  Echo in am.  Serial cardiac enzymes.  On pradaxa.  Active Problems:   Atrial fibrillation with RVR- dilt gtt.  pradaxa.   COPD (chronic obstructive pulmonary disease) changes nebs to xoponex   SOB (shortness of breath)  As above, may be multifactorial (may have copd component).  Will tx chf first and see how she responds.  Admit to stepdown.  DNR confirmed.    Shykeem Resurreccion A 10/16/2014, 12:01 AM

## 2014-10-16 NOTE — Progress Notes (Signed)
Patient seen and examined. Admitted earlier today for SOB, found to have a fib with RVR and pulmonary edema. Has a h/o a fib on pradaxa. Rate is now controlled. Will try to wean cardizem drip. Start PO cardizem. Lasix and strive for negative fluid balance. ECHO ordered and pending. Will continue to follow.  Peggye PittEstela Hernandez, MD [facility] Pager: [phone]

## 2014-10-17 ENCOUNTER — Ambulatory Visit: Payer: Medicare Other | Admitting: Internal Medicine

## 2014-10-17 ENCOUNTER — Telehealth: Payer: Self-pay | Admitting: *Deleted

## 2014-10-17 DIAGNOSIS — R0602 Shortness of breath: Secondary | ICD-10-CM

## 2014-10-17 DIAGNOSIS — J438 Other emphysema: Secondary | ICD-10-CM

## 2014-10-17 LAB — GLUCOSE, CAPILLARY
GLUCOSE-CAPILLARY: 105 mg/dL — AB (ref 70–99)
GLUCOSE-CAPILLARY: 162 mg/dL — AB (ref 70–99)
Glucose-Capillary: 111 mg/dL — ABNORMAL HIGH (ref 70–99)
Glucose-Capillary: 76 mg/dL (ref 70–99)
Glucose-Capillary: 84 mg/dL (ref 70–99)

## 2014-10-17 LAB — BASIC METABOLIC PANEL
Anion gap: 14 (ref 5–15)
BUN: 19 mg/dL (ref 6–23)
CHLORIDE: 85 meq/L — AB (ref 96–112)
CO2: 31 mEq/L (ref 19–32)
Calcium: 9 mg/dL (ref 8.4–10.5)
Creatinine, Ser: 0.82 mg/dL (ref 0.50–1.10)
GFR calc Af Amer: 70 mL/min — ABNORMAL LOW (ref >90)
GFR, EST NON AFRICAN AMERICAN: 61 mL/min — AB (ref >90)
GLUCOSE: 119 mg/dL — AB (ref 70–99)
POTASSIUM: 3.9 meq/L (ref 3.7–5.3)
Sodium: 130 mEq/L — ABNORMAL LOW (ref 137–147)

## 2014-10-17 LAB — HEMOGLOBIN A1C
Hgb A1c MFr Bld: 6.3 % — ABNORMAL HIGH (ref <5.7)
Mean Plasma Glucose: 134 mg/dL — ABNORMAL HIGH (ref <117)

## 2014-10-17 LAB — MAGNESIUM: MAGNESIUM: 1.9 mg/dL (ref 1.5–2.5)

## 2014-10-17 MED ORDER — LEVALBUTEROL HCL 1.25 MG/0.5ML IN NEBU
1.2500 mg | INHALATION_SOLUTION | Freq: Four times a day (QID) | RESPIRATORY_TRACT | Status: DC
  Administered 2014-10-17 – 2014-10-20 (×14): 1.25 mg via RESPIRATORY_TRACT
  Filled 2014-10-17 (×13): qty 0.5

## 2014-10-17 MED ORDER — SODIUM CHLORIDE 0.9 % IN NEBU
INHALATION_SOLUTION | RESPIRATORY_TRACT | Status: AC
  Administered 2014-10-17: 3 mL
  Filled 2014-10-17: qty 3

## 2014-10-17 NOTE — Care Management Note (Addendum)
    Page 1 of 2   10/20/2014     2:45:45 PM CARE MANAGEMENT NOTE 10/20/2014  Patient:  Carrie Heath,Carrie Heath   Account Number:  [id number]  Date Initiated:  10/17/2014  Documentation initiated by:  Kathyrn SheriffHILDRESS,JESSICA  Subjective/Objective Assessment:   Pt admitted for COPD exacerbation. Pt is from [facility]. Pt has rollator that she uses daily. Pt has no HH serivces or med needs prior to admission.     Action/Plan:   Pt plans to return to ALF at discharge. Pt eval pending CSW aware of discharge plan and will arrange for return to facility. Will continue to follow for CM needs.   Anticipated DC Date:  10/19/2014   Anticipated DC Plan:  ASSISTED LIVING / REST HOME  In-house referral  Clinical Social Worker      DC Associate Professorlanning Services  CM consult      [facility]  DURABLE [facility]QUIP[facility]   Choice offered to / List presented to:  NA   DME arranged  [facility]      [facility]     HH arranged  HH-2 PT      Status of service:  Completed, signed off Medicare Important Message given?  YES (If response is "NO", the following Medicare IM given date fields will be blank) Date Medicare IM given:  10/20/2014 Medicare IM given by:  Kathyrn SheriffHILDRESS,JESSICA Date Additional Medicare IM given:   Additional Medicare IM given by:    Discharge Disposition:  ASSISTED LIVING  Per UR Regulation:    If discussed at Long Length of Stay Meetings, dates discussed:   10/20/2014    Comments:  10/20/2014 1430 Kathyrn SheriffJessica Childress, RN, MSN, PCCN [facility]. [facility] and will be done at facility. [facility] per [facility]. Orders, and pt information has been faxed to [facility]. No further CM needs at this time.  10/17/2014 1100 Kathyrn SheriffJessica Childress, RN, MSN, [employer]

## 2014-10-17 NOTE — Telephone Encounter (Signed)
[facility] - Client TELEPHONE ADVICE RECORD [facility] Patient Name: Carrie Heath Gender: Female DOB: 09-09-1921 Age: [age] Y 3 M 9 D Return Phone Number: [phone] (Secondary) Address: City/State/Zip: [place] Client [facility] - Client Client Site [facility] - Night Contact Type Call Caller Name Johnathan HausenGary Cuellar Caller Phone Number took caller's # Relationship To Patient Son Is this call to report lab results? No Call Type General Information Initial Comment Caller says that her mother has an apt tomorrow AM, but she was taken to the hospital last night, and they are keeping her tonight, maybe tomorrow. Apt is with Dr. Maple HudsonYoung (not listed). Caller verified that he had the right address for the clinic. Advised to cb in the AM General Information Type Appointment Nurse Assessment Guidelines Guideline Title Affirmed Question Affirmed Notes Nurse Date/Time (Eastern Time) Disp. Time Carrie Heath(Eastern Time) Disposition Final User 10/16/2014 10:53:59 AM General Information Provided Yes Einar PheasantNoriega, Rosie

## 2014-10-17 NOTE — Progress Notes (Signed)
[facility] PROGRESS NOTE  Carrie DerbyLouise T Heath ZOX:[id number] DOB: [birth year] DOA: 10/15/2014 PCP: Nita SellsHALL,[name]  Assessment/Plan: A Fib with RVR -Rates controlled on PO cardizem. -On pradaxa for anticoagulation.  Acute on Chronic Diastolic CHF -Continue lasix. -Has been incontinent and hence I and O not accurate, but she does state she has been urinating quite frequently. -Continue to strive for negative fluid balance.  COPD -Steroid Dependant  Code Status: DNR Family Communication: Son Jillyn HiddenGary at bedside 12/7  Disposition Plan: back to ALf when ready   Consultants:  Cardiology, Dr. Wyline MoodBranch   Antibiotics:  None   Subjective: SOB improved, cough productive of yellow sputum  Objective: Filed Vitals:   10/17/14 1427 10/17/14 1500 10/17/14 1600 10/17/14 1619  BP:  132/64 107/56   Pulse:  50 61   Temp:    96.2 F (35.7 C)  TempSrc:    Oral  Resp:  20 20   Height:      Weight:      SpO2: 94%       Intake/Output Summary (Last 24 hours) at 10/17/14 1639 Last data filed at 10/17/14 1600  Gross per 24 hour  Intake 989.96 ml  Output   2000 ml  Net -1010.04 ml   Filed Weights   10/16/14 0100 10/17/14 0500  Weight: 65.3 kg (143 lb 15.4 oz) 66.6 kg (146 lb 13.2 oz)    Exam:   General:  AA Ox3  Cardiovascular: RRR  Respiratory: mild wheezing  Abdomen: S/NT/ND/+BS  Extremities: 1+edema bilaterally   Neurologic:  Non-focal  Data Reviewed: Basic Metabolic Panel:  Recent Labs Lab 10/15/14 2217 10/17/14 0539  NA 133* 130*  K 4.2 3.9  CL 88* 85*  CO2 32 31  GLUCOSE 132* 119*  BUN 12 19  CREATININE 0.73 0.82  CALCIUM 9.1 9.0  MG  --  1.9   Liver Function Tests:  Recent Labs Lab 10/15/14 2217  AST 21  ALT 13  ALKPHOS 91  BILITOT 0.3  PROT 7.6  ALBUMIN 3.9   No results for input(s): LIPASE, AMYLASE in the last 168 hours. No results for input(s): AMMONIA in the last 168 hours. CBC:  Recent Labs Lab 10/15/14 2217  WBC 12.2*    NEUTROABS 9.0*  HGB 13.0  HCT 39.2  MCV 94.7  PLT 246   Cardiac Enzymes:  Recent Labs Lab 10/15/14 2217 10/16/14 0158 10/16/14 0703 10/16/14 1312  TROPONINI <0.30 <0.30 <0.30 <0.30   BNP (last 3 results)  Recent Labs  04/28/14 0155 10/15/14 2217  PROBNP 1678.0* 2147.0*   CBG:  Recent Labs Lab 10/16/14 2110 10/16/14 2112 10/16/14 2230 10/17/14 0825 10/17/14 1128  GLUCAP 58* 68* 145* 111* 105*    Recent Results (from the past 240 hour(s))  MRSA PCR Screening     Status: Abnormal   Collection Time: 10/16/14 12:56 AM  Result Value Ref Range Status   MRSA by PCR POSITIVE (A) NEGATIVE Final    Comment: RESULT CALLED TO, READ BACK BY AND VERIFIED WITH: NIELSON T. AT [id number] ON [id number] BY THOMPSON S.        The GeneXpert MRSA Assay (FDA approved for NASAL specimens only), is one component of a comprehensive MRSA colonization surveillance program. It is not intended to diagnose MRSA infection nor to guide or monitor treatment for MRSA infections.      Studies: Dg Chest 2 View  10/16/2014   CLINICAL DATA:  Cough.  Shortness of breath.  EXAM: CHEST  2 VIEW  COMPARISON:  10/15/2014  FINDINGS: Mildly tortuous thoracic aorta. Subsegmental atelectasis at both lung bases. Mild enlargement of the cardiopericardial silhouette. No edema.  Thoracic spondylosis is present.  The pulmonary nodules shown on prior chest CTs are not well seen on today's chest radiograph.  IMPRESSION: 1. This patient had multiple pulmonary nodules measuring up to 10 mm average size, shown to be stable over a 9 month period in 2010, but if there were [facility]. If there was no CT followup elsewhere after the 08/29/2009 exam, then I would recommend a chest CT just to be careful and ensure that there is no progressive growth to indicate a low-grade malignancy. 2. Mild enlargement of the cardiopericardial silhouette, without edema. 3. Tortuous thoracic aorta. 4.  Bibasilar subsegmental atelectasis, without overt consolidation identified. 5. Thoracic spondylosis.   Electronically Signed   By: Herbie BaltimoreWalt  Liebkemann M.D.   On: 10/16/2014 18:07   Dg Chest Portable 1 View  10/15/2014   CLINICAL DATA:  Acute onset of shortness of breath and weakness for 3 days, progressively worsening. Nonproductive cough. Initial encounter.  EXAM: PORTABLE CHEST - 1 VIEW  COMPARISON:  Chest radiograph performed 04/28/2014  FINDINGS: The lungs are well-aerated. Vascular congestion is noted. Increased interstitial markings raise concern for mild interstitial edema. A small left pleural effusion is suspected. No pneumothorax is seen.  The cardiomediastinal silhouette is borderline normal in size. No acute osseous abnormalities are seen.  IMPRESSION: Vascular congestion noted. Increased interstitial markings raise concern for mild interstitial edema. Suspect small left pleural effusion.   Electronically Signed   By: Roanna RaiderJeffery  Chang M.D.   On: 10/15/2014 22:39    Scheduled Meds: . antiseptic oral rinse  7 mL Mouth Rinse BID  . aspirin EC  81 mg Oral Daily  . Chlorhexidine Gluconate Cloth  6 each Topical Q0600  . dabigatran  75 mg Oral Q12H  . diltiazem  30 mg Oral 3 times per day  . furosemide  40 mg Intravenous Q12H  . gabapentin  600 mg Oral QID  . insulin aspart  0-9 Units Subcutaneous TID WC  . insulin aspart  3 Units Subcutaneous TID WC  . levalbuterol  1.25 mg Nebulization Q6H WA  . mupirocin ointment  1 application Nasal BID  . potassium chloride SA  20 mEq Oral Daily  . predniSONE  2.5 mg Oral Q breakfast  . sodium chloride  3 mL Intravenous Q12H   Continuous Infusions: . diltiazem (CARDIZEM) infusion Stopped (10/16/14 1859)    Principal Problem:   Atrial fibrillation with RVR Active Problems:   CHF exacerbation   COPD (chronic obstructive pulmonary disease)   SOB (shortness of breath)    Time spent: 35 minutes. Greater than 50% of this time was spent in direct  contact with the patient coordinating care.    Chaya JanHERNANDEZ Heath,Carrie  [facility] Pager [phone]  If 7PM-7AM, please contact night-coverage at [email or web address], password [facility] 10/17/2014, 4:39 PM  LOS: 2 days

## 2014-10-17 NOTE — Progress Notes (Signed)
Inpatient Diabetes Program Recommendations  AACE/ADA: New Consensus Statement on Inpatient Glycemic Control (2013)  Target Ranges:  Prepandial:   less than 140 mg/dL      Peak postprandial:   less than 180 mg/dL (1-2 hours)      Critically ill patients:  140 - 180 mg/dL   Reason for Assessment: Hypoglycemia  Diabetes history: Type 2.  Last A1C 6.3 Outpatient Diabetes medications: Glucophage 500 mg bid Current orders for Inpatient glycemic control: Novolog sensitive correction tid, Novolog 3 units tid as meal coverage  Results for Carrie Heath, Carrie Heath (MRN [id number]) as of 10/17/2014 14:31  Ref. Range 10/16/2014 11:20 10/16/2014 16:25 10/16/2014 21:10 10/16/2014 21:12 10/16/2014 22:30 10/17/2014 08:25 10/17/2014 11:28  Glucose-Capillary Latest Range: 70-99 mg/dL 657227 (H) 846131 (H) 58 (L) 68 (L) 145 (H) 111 (H) 105 (H)   Note:  Request MD stop Novolog meal coverage order and continue Novolog sensitive correction to help avoid hypoglycemia.  Thank you.  Victor Langenbach S. Elsie [place]outh, RN, CNS, CDE Inpatient Diabetes Program, team pager [phone]

## 2014-10-17 NOTE — Evaluation (Signed)
Physical Therapy Evaluation Patient Details Name: Ronnie DerbyLouise T Meaney MRN: [id number] DOB: Jan 29, 1921 Today's Date: 10/17/2014   History of Present Illness  80 yo female h/o copd, dm, ??chf (reports she is on lasix for her leg swelling), htn comes in with sudden onset of sob tonight while she was lying down.  She says she just "couldnt' catch her breath".  Denies cp or palpirations.  Cannot lie down flat or her sob gets worse.  She does have a cough that is worse with lying down.  No fevers.  No chills. She does also have le edema which she says has been there since July and has not changed.  She feels better with lasix and oxygen given to her in the ED.  She has diuresed some, not measured in the ED.  Lives in assisted living.  Clinical Impression   Pt reports feeling better today but is still short of breath.  O2 sats on 4 L O2 run around 95%.  She is found to be mildly deconditioned due to recent illness.  Strength in LEs is generally 3/5 and overall endurance is fair.  She was able to ambulate in the room 12' with a rolling walker.  She was restricted by the ICU lines but I am unable to say how much more distance she could have walked.  Unless her status declines significantly, she should be able to return to ACLF at d/c.  The need for HHPT is only a guess at this point.    Follow Up Recommendations Home health PT    Equipment Recommendations  None recommended by PT    Recommendations for Other Services   none    Precautions / Restrictions Precautions Precautions: Fall Restrictions Weight Bearing Restrictions: No      Mobility  Bed Mobility Overal bed mobility: Needs Assistance Bed Mobility: Supine to Sit     Supine to sit: Supervision        Transfers Overall transfer level: Needs assistance Equipment used: Rolling walker (2 wheeled) Transfers: Sit to/from Stand Sit to Stand: Supervision            Ambulation/Gait Ambulation/Gait assistance: Supervision Ambulation  Distance (Feet): 12 Feet Assistive device: Rolling walker (2 wheeled) Gait Pattern/deviations: Trunk flexed Gait velocity: appropriate for situation   General Gait Details: gait distance is limited by ICU lines  Stairs            Wheelchair Mobility    Modified Rankin (Stroke Patients Only)       Balance Overall balance assessment: No apparent balance deficits (not formally assessed)                                           Pertinent Vitals/Pain Pain Assessment: No/denies pain    Home Living Family/patient expects to be discharged to:: Assisted living               Home Equipment: Walker - 4 wheels      Prior Function Level of Independence: Independent with assistive device(s)         Comments: pt is able to ambulate to dining room, dress herself independently     Hand Dominance        Extremity/Trunk Assessment   Upper Extremity Assessment: Overall WFL for tasks assessed           Lower Extremity Assessment: Generalized weakness (pt reports bilateral knee  pain at times from OA...LLE slightly stronger than RLE)      Cervical / Trunk Assessment: Kyphotic;Other exceptions  Communication   Communication: HOH  Cognition Arousal/Alertness: Awake/alert Behavior During Therapy: WFL for tasks assessed/performed Overall Cognitive Status: Within Functional Limits for tasks assessed                      General Comments      Exercises        Assessment/Plan    PT Assessment Patient needs continued PT services  PT Diagnosis Generalized weakness   PT Problem List Decreased strength;Decreased activity tolerance;Decreased mobility  PT Treatment Interventions Gait training;Functional mobility training;Therapeutic exercise   PT Goals (Current goals can be found in the Care Plan section) Acute Rehab PT Goals Patient Stated Goal: return to ACLF, independent PT Goal Formulation: With patient Time For Goal Achievement:  10/31/14 Potential to Achieve Goals: Good    Frequency Min 3X/week   Barriers to discharge   none    Co-evaluation               End of Session Equipment Utilized During Treatment: Gait belt Activity Tolerance: Patient tolerated treatment well Patient left: in chair;with call bell/phone within reach Nurse Communication: Mobility status         Time: [id number] PT Time Calculation (min) (ACUTE ONLY): 42 min   Charges:   PT Evaluation $Initial PT Evaluation Tier I: 1 Procedure     PT G CodesMyrlene Broker:          Teniola Tseng L 10/17/2014, 2:27 PM

## 2014-10-17 NOTE — Consult Note (Signed)
CARDIOLOGY CONSULT NOTE   Patient ID: Carrie Heath MRN: [id number] DOB/AGE: [birth year] 78 y.o.  Admit Date: 10/15/2014 Referring Physician: PTH Primary Physician: Benita StabileHALL,[name] Consulting Cardiologist: Dina RichBranch, Danny Yackley MD Primary Cardiologist: Prentice DockerKoneswaran, Suresh MD Reason for Consultation: AFib with RVR  Clinical Summary [name] is a 80 y.o.female with known history of permanent atrial fibrillation, normal EF per echo in 2013 with patent foramen ovale,  COPD with chronic dyspnea, chronic LEE. Admitted from SNF with worsening dyspnea, orthopnea and PND. She has poor memory of events but son at bedside states that she complained of coughing and DOE for a few days before coming to ER. She walked to dining room and "could hardly make it back to her room" due to dyspnea. Also coughing up green phlegm.  Per notes, she was found to be in afib with RVR, but EKG is not available to review. HR documented in ER 103-122 bpm. She was afebrile. BP 163/91.  Leukocytosis noted, with mild hyponatremia. GFR 72. Pro-BNP 2,147. CXR reported vascular congestion, increased interstitial markings, with concerns for mild interstitial edema.Troponins negative X 4.   She was started on diltiazem gtt, given IV lasix 40 mg, and albuterol treatment. I/O is not documented from ER, but she states that she has been urinating a lot since admission. She is breathing much better on O2 via [place] . She continues on IV lasix 40 mg Q 12 hours. On Xopenex neb tx.   She has been transitioned to po diltiazem 30 mg  Q6 hrs. HR in the 70's. Currently not on abx.  No Known Allergies  Medications Scheduled Medications: . antiseptic oral rinse  7 mL Mouth Rinse BID  . aspirin EC  81 mg Oral Daily  . Chlorhexidine Gluconate Cloth  6 each Topical Q0600  . dabigatran  75 mg Oral Q12H  . diltiazem  30 mg Oral 3 times per day  . furosemide  40 mg Intravenous Q12H  . gabapentin  600 mg Oral QID  . insulin aspart  0-9 Units  Subcutaneous TID WC  . insulin aspart  3 Units Subcutaneous TID WC  . levalbuterol  1.25 mg Nebulization Q6H WA  . mupirocin ointment  1 application Nasal BID  . potassium chloride SA  20 mEq Oral Daily  . predniSONE  2.5 mg Oral Q breakfast  . sodium chloride  3 mL Intravenous Q12H    Infusions: . diltiazem (CARDIZEM) infusion Stopped (10/16/14 1859)    PRN Medications: sodium chloride, acetaminophen, ondansetron (ZOFRAN) IV, sodium chloride   Past Medical History  Diagnosis Date  . DM type 2 (diabetes mellitus, type 2)   . Bronchitis   . COPD (chronic obstructive pulmonary disease)   . Hyperlipidemia   . HTN (hypertension)   . Neuropathy     Past Surgical History  Procedure Laterality Date  . Thyroidectomy, partial      remote  . Tonsillectomy    . Appendectomy    . Cholecystectomy    . Total abdominal hysterectomy    . Bladder surgery      Family History  Problem Relation Age of Onset  . Heart disease Father   . Heart disease Brother     CABG  . Alzheimer's disease Mother     Social History [name] reports that she quit smoking about 7 years ago. Her smoking use included Cigarettes. She started smoking about 70 years ago. She smoked 0.50 packs per day. She has quit using smokeless tobacco. [name]  reports that she does not drink alcohol.  Review of Systems Complete review of systems are found to be negative unless outlined in H&P above.  Physical Examination Blood pressure 106/53, pulse 74, temperature 97.6 F (36.4 C), temperature source Oral, resp. rate 18, height 5\' 4"  (1.626 m), weight 146 lb 13.2 oz (66.6 kg), SpO2 99 %.  Intake/Output Summary (Last 24 hours) at 10/17/14 0748 Last data filed at 10/17/14 0500  Gross per 24 hour  Intake 1779.96 ml  Output   1600 ml  Net 179.96 ml    Telemetry:Atrial fib rates in the 70's and 80's.   GEN:No acute distress HEENT: Conjunctiva and lids normal, oropharynx clear with moist mucosa. Neck:  Supple, no elevated JVP or carotid bruits, no thyromegaly. Lungs: Bilateral rales and rhonchi with productive coughing. Cardiac: Irregular rate and rhythm, no S3 or significant systolic murmur, no pericardial rub. Abdomen: Soft, nontender, no hepatomegaly, bowel sounds present, no guarding or rebound. Extremities: No pitting edema, distal pulses 2+. Reddened skin without evidence of cellulitis. Large area of ecchymosis right antecubital.  Skin: Warm and dry. Musculoskeletal: No kyphosis. Neuropsychiatric: Alert and oriented x3, affect grossly appropriate. Trouble with memory.   Prior Cardiac Testing/Procedures 1.Echocardiogram 04/03/2012 Left ventricle: The cavity size was normal. There was mild focal basal hypertrophy of the septum. Systolic function was vigorous. The estimated ejection fraction was in the range of 70% to 75%. Wall motion was normal; there were no regional wall motion abnormalities. The study is not technically sufficient to allow evaluation of LV diastolic function. - Aortic valve: Trileaflet; mildly calcified leaflets. Mild regurgitation. - Mitral valve: Calcified annulus. Mild to moderate regurgitation. - Left atrium: The atrium was moderately dilated. - Right atrium: The atrium was mildly dilated. - Atrial septum: There was a patent foramen ovale with evidence of left to right shunting by color Doppler. - Tricuspid valve: Moderate regurgitation. Peak gradient: 18mm Hg (D). - Inferior vena cava: The vessel was dilated; the respirophasic diameter changes were in the normal range (= 50%). - Pericardium, extracardiac: There was no pericardial effusion.  Lab Results  Basic Metabolic Panel:  Recent Labs Lab 10/15/14 2217 10/17/14 0539  NA 133* 130*  K 4.2 3.9  CL 88* 85*  CO2 32 31  GLUCOSE 132* 119*  BUN 12 19  CREATININE 0.73 0.82  CALCIUM 9.1 9.0    Liver Function Tests:  Recent Labs Lab 10/15/14 2217  AST 21  ALT  13  ALKPHOS 91  BILITOT 0.3  PROT 7.6  ALBUMIN 3.9    CBC:  Recent Labs Lab 10/15/14 2217  WBC 12.2*  NEUTROABS 9.0*  HGB 13.0  HCT 39.2  MCV 94.7  PLT 246    Cardiac Enzymes:  Recent Labs Lab 10/15/14 2217 10/16/14 0158 10/16/14 0703 10/16/14 1312  TROPONINI <0.30 <0.30 <0.30 <0.30    BNP: 2,147   Radiology: Dg Chest 2 View  10/16/2014   CLINICAL DATA:  Cough.  Shortness of breath.  EXAM: CHEST  2 VIEW  COMPARISON:  10/15/2014  FINDINGS: Mildly tortuous thoracic aorta. Subsegmental atelectasis at both lung bases. Mild enlargement of the cardiopericardial silhouette. No edema.  Thoracic spondylosis is present.  The pulmonary nodules shown on prior chest CTs are not well seen on today's chest radiograph.  IMPRESSION: 1. This patient had multiple pulmonary nodules measuring up to 10 mm average size, shown to be stable over a 9 month period in 2010, but if there were followed [facility] afterwards [facility]. If  there was no CT followup elsewhere after the 08/29/2009 exam, then I would recommend a chest CT just to be careful and ensure that there is no progressive growth to indicate a low-grade malignancy. 2. Mild enlargement of the cardiopericardial silhouette, without edema. 3. Tortuous thoracic aorta. 4. Bibasilar subsegmental atelectasis, without overt consolidation identified. 5. Thoracic spondylosis.   Electronically Signed   By: Herbie Baltimore M.D.   On: 10/16/2014 18:07   Dg Chest Portable 1 View  10/15/2014   CLINICAL DATA:  Acute onset of shortness of breath and weakness for 3 days, progressively worsening. Nonproductive cough. Initial encounter.  EXAM: PORTABLE CHEST - 1 VIEW  COMPARISON:  Chest radiograph performed 04/28/2014  FINDINGS: The lungs are well-aerated. Vascular congestion is noted. Increased interstitial markings raise concern for mild interstitial edema. A small left pleural effusion is suspected. No pneumothorax is seen.  The  cardiomediastinal silhouette is borderline normal in size. No acute osseous abnormalities are seen.  IMPRESSION: Vascular congestion noted. Increased interstitial markings raise concern for mild interstitial edema. Suspect small left pleural effusion.   Electronically Signed   By: Roanna Raider M.D.   On: 10/15/2014 22:39     ECG: Pending   Impression and Recommendations  1. Atrial fib with RVR: Hx of permanent atrial fib on Pradaxa. Review of home medications does not reveal that she is on rate control medications. But on Pradaxa BID. May be related to COPD exacerbation. On steroids with elevated WBCs.  Now tolerating CCB, diltiazem 30 mg Q6 hours. BP low normal. CHADS VASC Score of 6. Troponin negative X 4. Would not use BB due to lung disease, or dig with elevated GFR.   2. CHF: Mild resolved on follow up CXR. Cannot assess urine output. She is asymptomatic. Echo is pending results.   3. COPD: Followed by Dr. Jetty Duhamel, now on Xopenex. No abx therapy at present.  4. Diabetes: Management per PTH.   Signed: Bettey Mare. Lawrence NP [facility]  10/17/2014, 7:48 AM Co-Sign MD  Patient seen and discussed with NP Lyman Bishop, I agree with her documentation above. 80 yo female hx of afib, PFO with left to right shunting, COPD, HTN admitted with SOB. From notes her afib had previously not required AV nodal agents, she has been on pradaxa for stroke prevention. Admitted with afib with RVR, started on dilt gtt, now transitioned to oral dilt 30mg  q 6 hrs. Continue pradaxa at current dose, though her renal function does not preclude higher dosing given her age and associated bleeding risk reasonable to continue at 75mg  bid.   03/2012 echo: LVEF 70-75%, cannot eval diastolic function due to afib, mild to mod MR, PFO with left to right shunt, normal RV CXR vascular congestion Na 130 K 3.9 Cr 0.82 BUN 19 GFR 61 Trop neg x 3 TSH 1.58 Hgb 13 WBC 12.2 pro-BNP 2147 EKG not available, will repeat  Afib with  RVR, likely leading to acute diastolic HF. She is on lasix IV 40mg  bid without much documented diuresis but improved symptoms, will continue today. Agree with transitioning to oral short acting diltiazem. F/u repeat echo. Would keep in ICU today for monitoring. Family is asking for possible pulmonary consult, defer to primary team.   Dominga Ferry MD

## 2014-10-17 NOTE — Clinical Social Work Psychosocial (Signed)
Clinical Social Work Department BRIEF PSYCHOSOCIAL ASSESSMENT 10/17/2014  Patient:  Carrie Heath, Carrie Heath     Account Number:  [id number]     Admit date:  10/15/2014  Clinical Social Worker:  Legrand Como  Date/Time:  10/17/2014 11:48 AM  Referred by:  CSW  Date Referred:  10/17/2014 Referred for  ALF Placement   Other Referral:   Interview type:  Patient Other interview type:   Patient  Son, Carrie Heath at bedside  Son, Carrie Heath and Daughter In Carrie Heath via phone  [place], [place] Status:  FACILITY Admitted from facility:  [facility] Level of care:  Assisted Living Primary support name:  Carrie Heath Primary support relationship to patient:  CHILD, ADULT Degree of support available:   Family is very supportive    CURRENT CONCERNS Current Concerns  Post-Acute Placement   Other Concerns:    SOCIAL WORK ASSESSMENT / PLAN CSW met with patient and son Carrie Heath at bedside. Patient's son and daughter-in-law, Carrie Heath and Carrie Heath were on the phone.  Patient was alert and oriented x4.  Patient indicated that she has been at [facility] since 4/15. She indicated that she uses a rolling wheelchair with a seat to ambulate.  She stated that her family lives out of town but they visit with her often.  Patient indicated that she wanted to go back to [facility] upon discharge. She indicated that she is able to complete her ADLs independently.  Patient's family also indicated that they wanted patient to return to the facility upon discharge.  CSW spoke with Tammy at [facility]. Tammy confirmed patient's statements.  She indicated that patient's was "sharp as a tack" in regards to her cognition.  She stated that patient was able to return to the facility upon discharge.   Assessment/plan status:  Information/Referral to [employer] Other assessment/ plan:   Information/referral to  community resources:    PATIENT'S/FAMILY'S RESPONSE TO PLAN OF CARE: Patient and family desire for patient to return to [facility] upon discharge.   Ambrose Pancoast, [place]

## 2014-10-17 NOTE — Care Management Utilization Note (Signed)
UR complete 

## 2014-10-18 LAB — BASIC METABOLIC PANEL
Anion gap: 11 (ref 5–15)
BUN: 19 mg/dL (ref 6–23)
CALCIUM: 8.7 mg/dL (ref 8.4–10.5)
CO2: 35 meq/L — AB (ref 19–32)
CREATININE: 0.81 mg/dL (ref 0.50–1.10)
Chloride: 89 mEq/L — ABNORMAL LOW (ref 96–112)
GFR calc non Af Amer: 62 mL/min — ABNORMAL LOW (ref >90)
GFR, EST AFRICAN AMERICAN: 71 mL/min — AB (ref >90)
Glucose, Bld: 128 mg/dL — ABNORMAL HIGH (ref 70–99)
Potassium: 3.1 mEq/L — ABNORMAL LOW (ref 3.7–5.3)
Sodium: 135 mEq/L — ABNORMAL LOW (ref 137–147)

## 2014-10-18 LAB — GLUCOSE, CAPILLARY
GLUCOSE-CAPILLARY: 145 mg/dL — AB (ref 70–99)
GLUCOSE-CAPILLARY: 197 mg/dL — AB (ref 70–99)
Glucose-Capillary: 97 mg/dL (ref 70–99)
Glucose-Capillary: 137 mg/dL — ABNORMAL HIGH (ref 70–99)

## 2014-10-18 LAB — MAGNESIUM: Magnesium: 1.7 mg/dL (ref 1.5–2.5)

## 2014-10-18 MED ORDER — DILTIAZEM HCL ER COATED BEADS 120 MG PO CP24
120.0000 mg | ORAL_CAPSULE | Freq: Every day | ORAL | Status: DC
  Administered 2014-10-18 – 2014-10-20 (×3): 120 mg via ORAL
  Filled 2014-10-18 (×3): qty 1

## 2014-10-18 MED ORDER — POTASSIUM CHLORIDE CRYS ER 20 MEQ PO TBCR
40.0000 meq | EXTENDED_RELEASE_TABLET | ORAL | Status: AC
  Administered 2014-10-18 (×3): 40 meq via ORAL
  Filled 2014-10-18 (×3): qty 2

## 2014-10-18 MED ORDER — PREDNISONE 20 MG PO TABS
60.0000 mg | ORAL_TABLET | Freq: Every day | ORAL | Status: DC
  Administered 2014-10-19: 60 mg via ORAL
  Filled 2014-10-18: qty 3

## 2014-10-18 MED ORDER — FUROSEMIDE 40 MG PO TABS
40.0000 mg | ORAL_TABLET | Freq: Two times a day (BID) | ORAL | Status: DC
  Administered 2014-10-18 – 2014-10-20 (×5): 40 mg via ORAL
  Filled 2014-10-18 (×5): qty 1

## 2014-10-18 NOTE — Progress Notes (Signed)
Patient ID: Carrie Heath, female   DOB: [birth year], 80 y.o.   MRN: [id number]    Primary cardiologist:  Subjective:    SOB improving but not resolved.   Objective:   Temp:  [96.2 F (35.7 C)-98.6 F (37 C)] 97.5 F (36.4 C) (12/08 0400) Pulse Rate:  [34-156] 156 (12/08 0800) Resp:  [14-23] 23 (12/08 0800) BP: (90-132)/(31-93) 119/65 mmHg (12/08 0800) SpO2:  [81 %-100 %] 99 % (12/08 0700) Weight:  [144 lb 10 oz (65.6 kg)] 144 lb 10 oz (65.6 kg) (12/08 0500) Last BM Date: 10/15/14  Filed Weights   10/16/14 0100 10/17/14 0500 10/18/14 0500  Weight: 143 lb 15.4 oz (65.3 kg) 146 lb 13.2 oz (66.6 kg) 144 lb 10 oz (65.6 kg)    Intake/Output Summary (Last 24 hours) at 10/18/14 0854 Last data filed at 10/18/14 0600  Gross per 24 hour  Intake  476.5 ml  Output   2250 ml  Net -1773.5 ml    Telemetry: afib rates 60s-90s  Exam:  General: NAD  Resp: bilateral expiratory wheezing  Cardiac: irreg, no m/r/g, no JVD  GI: abdomen soft, NT, ND  MSK: no LE edema  Neuro: no focal deficits   Lab Results:  Basic Metabolic Panel:  Recent Labs Lab 10/15/14 2217 10/17/14 0539 10/18/14 0424  NA 133* 130* 135*  K 4.2 3.9 3.1*  CL 88* 85* 89*  CO2 32 31 35*  GLUCOSE 132* 119* 128*  BUN 12 19 19   CREATININE 0.73 0.82 0.81  CALCIUM 9.1 9.0 8.7  MG  --  1.9  --     Liver Function Tests:  Recent Labs Lab 10/15/14 2217  AST 21  ALT 13  ALKPHOS 91  BILITOT 0.3  PROT 7.6  ALBUMIN 3.9    CBC:  Recent Labs Lab 10/15/14 2217  WBC 12.2*  HGB 13.0  HCT 39.2  MCV 94.7  PLT 246    Cardiac Enzymes:  Recent Labs Lab 10/16/14 0158 10/16/14 0703 10/16/14 1312  TROPONINI <0.30 <0.30 <0.30    BNP:  Recent Labs  04/28/14 0155 10/15/14 2217  PROBNP 1678.0* 2147.0*    Coagulation: No results for input(s): INR in the last 168 hours.  ECG:   Medications:   Scheduled Medications: . antiseptic oral rinse  7 mL Mouth Rinse BID  . aspirin EC  81 mg  Oral Daily  . Chlorhexidine Gluconate Cloth  6 each Topical Q0600  . dabigatran  75 mg Oral Q12H  . diltiazem  30 mg Oral 3 times per day  . furosemide  40 mg Intravenous Q12H  . gabapentin  600 mg Oral QID  . insulin aspart  0-9 Units Subcutaneous TID WC  . insulin aspart  3 Units Subcutaneous TID WC  . levalbuterol  1.25 mg Nebulization Q6H WA  . mupirocin ointment  1 application Nasal BID  . potassium chloride SA  20 mEq Oral Daily  . predniSONE  2.5 mg Oral Q breakfast  . sodium chloride  3 mL Intravenous Q12H     Infusions: . diltiazem (CARDIZEM) infusion Stopped (10/16/14 1859)     PRN Medications:  sodium chloride, acetaminophen, ondansetron (ZOFRAN) IV, sodium chloride     Assessment/Plan    1. Afib with RVR - on oral dilt 30mg  tid, pradaxa for stroke prevention - will change to long acting dilt 120mg  daily and follow heart rates and bp.   2. Acute heart failure - awaiting echo results, exacerbating factor likely her afib with RVR -  she is negative 1.7 liters yesterday, negative 1.5 liters since admission. She is on lasix 40mg  IV bid, renal function remains stable  - volume status appears improved, still with significant wheezing on her exam secondary to COPD, likely the ongoing etiology for her SOB. Will change to oral lasix 40mg  bid.   3. Hypokalemia - likely due to diuretics, please keep K at 4 and Mg at 2 in setting of cardiac arrhythmia.    Consider transfer to regular floor today    Dina RichJonathan Branch, M.D.

## 2014-10-18 NOTE — Progress Notes (Signed)
[facility] PROGRESS NOTE  Ronnie DerbyLouise T Nick YNW:[id number] DOB: 07/09/1921 DOA: 10/15/2014 PCP: Nita SellsHALL,[name]  Assessment/Plan: A Fib with RVR -Rates controlled on PO cardizem. -Has been transitioned to long-acting cardizem today by cardiology. -On pradaxa for anticoagulation.  Acute on Chronic Diastolic CHF -Continue lasix, but change to PO today. -Has been incontinent and hence I and O not accurate, but she does state she has been urinating quite frequently. -Continue to strive for negative fluid balance.  COPD with Mild Acute Exacerbation -Steroid Dependant. -Has increased wheezing today. -Will increase steroids to 60 mg today and start tapering in am. Is on 1 mg daily at home.  Hypokalemia -Replete PO. -Check Mg level an replete as necessary.  Code Status: DNR Family Communication: Son Jillyn HiddenGary at bedside 12/7  Disposition Plan: back to ALF when ready   Consultants:  Cardiology, Dr. Wyline MoodBranch   Antibiotics:  None   Subjective: SOB improved, wheezing today.  Objective: Filed Vitals:   10/18/14 0900 10/18/14 1000 10/18/14 1100 10/18/14 1200  BP: 110/53 97/52 109/60 114/51  Pulse: 76 84 79 72  Temp:   97.7 F (36.5 C)   TempSrc:   Oral   Resp: 19 21 17 20   Height:      Weight:      SpO2: 97% 92% 95% 99%    Intake/Output Summary (Last 24 hours) at 10/18/14 1347 Last data filed at 10/18/14 0600  Gross per 24 hour  Intake  476.5 ml  Output   1900 ml  Net -1423.5 ml   Filed Weights   10/16/14 0100 10/17/14 0500 10/18/14 0500  Weight: 65.3 kg (143 lb 15.4 oz) 66.6 kg (146 lb 13.2 oz) 65.6 kg (144 lb 10 oz)    Exam:   General:  AA Ox3  Cardiovascular: RRR  Respiratory: expiratory wheezes  Abdomen: S/NT/ND/+BS  Extremities: 1+edema bilaterally   Neurologic:  Non-focal  Data Reviewed: Basic Metabolic Panel:  Recent Labs Lab 10/15/14 2217 10/17/14 0539 10/18/14 0424 10/18/14 1007  NA 133* 130* 135*  --   K 4.2 3.9 3.1*  --   CL  88* 85* 89*  --   CO2 32 31 35*  --   GLUCOSE 132* 119* 128*  --   BUN 12 19 19   --   CREATININE 0.73 0.82 0.81  --   CALCIUM 9.1 9.0 8.7  --   MG  --  1.9  --  1.7   Liver Function Tests:  Recent Labs Lab 10/15/14 2217  AST 21  ALT 13  ALKPHOS 91  BILITOT 0.3  PROT 7.6  ALBUMIN 3.9   No results for input(s): LIPASE, AMYLASE in the last 168 hours. No results for input(s): AMMONIA in the last 168 hours. CBC:  Recent Labs Lab 10/15/14 2217  WBC 12.2*  NEUTROABS 9.0*  HGB 13.0  HCT 39.2  MCV 94.7  PLT 246   Cardiac Enzymes:  Recent Labs Lab 10/15/14 2217 10/16/14 0158 10/16/14 0703 10/16/14 1312  TROPONINI <0.30 <0.30 <0.30 <0.30   BNP (last 3 results)  Recent Labs  04/28/14 0155 10/15/14 2217  PROBNP 1678.0* 2147.0*   CBG:  Recent Labs Lab 10/17/14 1617 10/17/14 2118 10/17/14 2150 10/18/14 0809 10/18/14 1134  GLUCAP 162* 76 84 145* 197*    Recent Results (from the past 240 hour(s))  MRSA PCR Screening     Status: Abnormal   Collection Time: 10/16/14 12:56 AM  Result Value Ref Range Status   MRSA by PCR POSITIVE (A) NEGATIVE  Final    Comment: RESULT CALLED TO, READ BACK BY AND VERIFIED WITH: NIELSON T. AT 0656A ON [id number] BY THOMPSON S.        The GeneXpert MRSA Assay (FDA approved for NASAL specimens only), is one component of a comprehensive MRSA colonization surveillance program. It is not intended to diagnose MRSA infection nor to guide or monitor treatment for MRSA infections.      Studies: Dg Chest 2 View  10/16/2014   CLINICAL DATA:  Cough.  Shortness of breath.  EXAM: CHEST  2 VIEW  COMPARISON:  10/15/2014  FINDINGS: Mildly tortuous thoracic aorta. Subsegmental atelectasis at both lung bases. Mild enlargement of the cardiopericardial silhouette. No edema.  Thoracic spondylosis is present.  The pulmonary nodules shown on prior chest CTs are not well seen on today's chest radiograph.  IMPRESSION: 1. This patient had multiple  pulmonary nodules measuring up to 10 mm average size, shown to be stable over a 9 month period in 2010, but if there were [facility] afterwards [facility]. If there was no CT followup elsewhere after the 08/29/2009 exam, then I would recommend a chest CT just to be careful and ensure that there is no progressive growth to indicate a low-grade malignancy. 2. Mild enlargement of the cardiopericardial silhouette, without edema. 3. Tortuous thoracic aorta. 4. Bibasilar subsegmental atelectasis, without overt consolidation identified. 5. Thoracic spondylosis.   Electronically Signed   By: Herbie BaltimoreWalt  Liebkemann M.D.   On: 10/16/2014 18:07    Scheduled Meds: . antiseptic oral rinse  7 mL Mouth Rinse BID  . Chlorhexidine Gluconate Cloth  6 each Topical Q0600  . dabigatran  75 mg Oral Q12H  . diltiazem  120 mg Oral Daily  . furosemide  40 mg Oral BID  . gabapentin  600 mg Oral QID  . insulin aspart  0-9 Units Subcutaneous TID WC  . insulin aspart  3 Units Subcutaneous TID WC  . levalbuterol  1.25 mg Nebulization Q6H WA  . mupirocin ointment  1 application Nasal BID  . potassium chloride SA  20 mEq Oral Daily  . potassium chloride  40 mEq Oral Q4H  . [START ON 10/19/2014] predniSONE  60 mg Oral Q breakfast  . sodium chloride  3 mL Intravenous Q12H   Continuous Infusions:    Principal Problem:   Atrial fibrillation with RVR Active Problems:   CHF exacerbation   COPD (chronic obstructive pulmonary disease)   SOB (shortness of breath)   Acute congestive heart failure    Time spent: 35 minutes. Greater than 50% of this time was spent in direct contact with the patient coordinating care.    Chaya JanHERNANDEZ ACOSTA,ESTELA  [facility] Pager [phone]  If 7PM-7AM, please contact night-coverage at [email or web address], password [email or web address] 10/18/2014, 1:47 PM  LOS: 3 days

## 2014-10-18 NOTE — Progress Notes (Signed)
Pt a/o.vss.resting in bed. No complaints of any distress. Report given to [facility], RN, caregiver verbalized understanding of report. Pt to be transferred to room 310 via wheelchair.

## 2014-10-19 ENCOUNTER — Encounter: Payer: Self-pay | Admitting: Internal Medicine

## 2014-10-19 ENCOUNTER — Encounter (HOSPITAL_COMMUNITY): Payer: Self-pay | Admitting: Internal Medicine

## 2014-10-19 DIAGNOSIS — E1142 Type 2 diabetes mellitus with diabetic polyneuropathy: Secondary | ICD-10-CM

## 2014-10-19 DIAGNOSIS — G629 Polyneuropathy, unspecified: Secondary | ICD-10-CM

## 2014-10-19 DIAGNOSIS — J441 Chronic obstructive pulmonary disease with (acute) exacerbation: Secondary | ICD-10-CM

## 2014-10-19 DIAGNOSIS — I5033 Acute on chronic diastolic (congestive) heart failure: Principal | ICD-10-CM

## 2014-10-19 LAB — BASIC METABOLIC PANEL
Anion gap: 10 (ref 5–15)
BUN: 15 mg/dL (ref 6–23)
CALCIUM: 8.8 mg/dL (ref 8.4–10.5)
CO2: 33 mEq/L — ABNORMAL HIGH (ref 19–32)
CREATININE: 0.82 mg/dL (ref 0.50–1.10)
Chloride: 94 mEq/L — ABNORMAL LOW (ref 96–112)
GFR calc Af Amer: 70 mL/min — ABNORMAL LOW (ref >90)
GFR calc non Af Amer: 61 mL/min — ABNORMAL LOW (ref >90)
GLUCOSE: 127 mg/dL — AB (ref 70–99)
Potassium: 4 mEq/L (ref 3.7–5.3)
Sodium: 137 mEq/L (ref 137–147)

## 2014-10-19 LAB — GLUCOSE, CAPILLARY
GLUCOSE-CAPILLARY: 188 mg/dL — AB (ref 70–99)
GLUCOSE-CAPILLARY: 238 mg/dL — AB (ref 70–99)
Glucose-Capillary: 128 mg/dL — ABNORMAL HIGH (ref 70–99)
Glucose-Capillary: 225 mg/dL — ABNORMAL HIGH (ref 70–99)

## 2014-10-19 LAB — CBC
HCT: 35 % — ABNORMAL LOW (ref 36.0–46.0)
Hemoglobin: 11.5 g/dL — ABNORMAL LOW (ref 12.0–15.0)
MCH: 31.3 pg (ref 26.0–34.0)
MCHC: 32.9 g/dL (ref 30.0–36.0)
MCV: 95.1 fL (ref 78.0–100.0)
PLATELETS: 232 10*3/uL (ref 150–400)
RBC: 3.68 MIL/uL — ABNORMAL LOW (ref 3.87–5.11)
RDW: 14 % (ref 11.5–15.5)
WBC: 10.8 10*3/uL — ABNORMAL HIGH (ref 4.0–10.5)

## 2014-10-19 MED ORDER — INSULIN GLARGINE 100 UNIT/ML ~~LOC~~ SOLN
10.0000 [IU] | Freq: Every day | SUBCUTANEOUS | Status: DC
  Filled 2014-10-19: qty 0.1

## 2014-10-19 MED ORDER — FAMOTIDINE 20 MG PO TABS
20.0000 mg | ORAL_TABLET | Freq: Every day | ORAL | Status: DC
  Administered 2014-10-19 – 2014-10-20 (×2): 20 mg via ORAL
  Filled 2014-10-19 (×2): qty 1

## 2014-10-19 MED ORDER — INSULIN ASPART 100 UNIT/ML ~~LOC~~ SOLN
0.0000 [IU] | Freq: Every day | SUBCUTANEOUS | Status: DC
  Administered 2014-10-19: 2 [IU] via SUBCUTANEOUS

## 2014-10-19 MED ORDER — DEXTROSE 5 % IV SOLN
500.0000 mg | INTRAVENOUS | Status: DC
  Administered 2014-10-19 – 2014-10-20 (×2): 500 mg via INTRAVENOUS
  Filled 2014-10-19 (×3): qty 500

## 2014-10-19 MED ORDER — PREDNISONE 20 MG PO TABS
50.0000 mg | ORAL_TABLET | Freq: Every day | ORAL | Status: DC
  Administered 2014-10-20: 50 mg via ORAL
  Filled 2014-10-19: qty 2
  Filled 2014-10-19: qty 1

## 2014-10-19 MED ORDER — POTASSIUM CHLORIDE CRYS ER 20 MEQ PO TBCR
20.0000 meq | EXTENDED_RELEASE_TABLET | Freq: Two times a day (BID) | ORAL | Status: DC
  Administered 2014-10-19 – 2014-10-20 (×3): 20 meq via ORAL
  Filled 2014-10-19 (×3): qty 1

## 2014-10-19 MED ORDER — INSULIN GLARGINE 100 UNIT/ML ~~LOC~~ SOLN
7.0000 [IU] | Freq: Every day | SUBCUTANEOUS | Status: DC
  Administered 2014-10-19: 7 [IU] via SUBCUTANEOUS
  Filled 2014-10-19: qty 0.07

## 2014-10-19 MED ORDER — CEFTRIAXONE SODIUM IN DEXTROSE 20 MG/ML IV SOLN
1.0000 g | INTRAVENOUS | Status: DC
  Administered 2014-10-19 – 2014-10-20 (×2): 1 g via INTRAVENOUS
  Filled 2014-10-19 (×3): qty 50

## 2014-10-19 NOTE — Progress Notes (Signed)
Consulting cardiologist: Dina RichBranch, Rosaisela Jamroz MD Primary Cardiologist: Prentice DockerKoneswaran, Suresh MD  Cardiology Specific Problem List:  1. Afib with RVR 2. CHF  Subjective:    Continues coughing and wheezing. No chest pain.  Objective:   Temp:  [97.7 F (36.5 C)-98.6 F (37 C)] 98 F (36.7 C) (12/09 0317) Pulse Rate:  [68-84] 82 (12/09 0317) Resp:  [17-21] 20 (12/09 0317) BP: (97-125)/(51-63) 125/63 mmHg (12/09 0317) SpO2:  [92 %-99 %] 95 % (12/09 0651) Weight:  [138 lb 14.4 oz (63.005 kg)] 138 lb 14.4 oz (63.005 kg) (12/09 0317) Last BM Date: 10/19/14  Filed Weights   10/17/14 0500 10/18/14 0500 10/19/14 0317  Weight: 146 lb 13.2 oz (66.6 kg) 144 lb 10 oz (65.6 kg) 138 lb 14.4 oz (63.005 kg)    Intake/Output Summary (Last 24 hours) at 10/19/14 0952 Last data filed at 10/18/14 2018  Gross per 24 hour  Intake    240 ml  Output    860 ml  Net   -620 ml    Telemetry: Atrial fib rates in the 80's.   Exam:  General: No acute distress.  HEENT: Conjunctiva and lids normal, oropharynx clear.  Lungs: Inspiratory and expiratory wheezing. Frequent productive coughing.  Cardiac: No elevated JVP or bruits. RRR, no gallop or rub.   Abdomen: Normoactive bowel sounds, nontender, nondistended.  Extremities: No pitting edema, distal pulses full.  Neuropsychiatric: Alert and oriented x3, affect appropriate.   Lab Results:  Basic Metabolic Panel:  Recent Labs Lab 10/17/14 0539 10/18/14 0424 10/18/14 1007 10/19/14 0559  NA 130* 135*  --  137  K 3.9 3.1*  --  4.0  CL 85* 89*  --  94*  CO2 31 35*  --  33*  GLUCOSE 119* 128*  --  127*  BUN 19 19  --  15  CREATININE 0.82 0.81  --  0.82  CALCIUM 9.0 8.7  --  8.8  MG 1.9  --  1.7  --     Liver Function Tests:  Recent Labs Lab 10/15/14 2217  AST 21  ALT 13  ALKPHOS 91  BILITOT 0.3  PROT 7.6  ALBUMIN 3.9    CBC:  Recent Labs Lab 10/15/14 2217 10/19/14 0559  WBC 12.2* 10.8*  HGB 13.0 11.5*  HCT 39.2  35.0*  MCV 94.7 95.1  PLT 246 232    Cardiac Enzymes:  Recent Labs Lab 10/16/14 0158 10/16/14 0703 10/16/14 1312  TROPONINI <0.30 <0.30 <0.30    Recent Labs  04/28/14 0155 10/15/14 2217  PROBNP 1678.0* 2147.0*    Medications:   Scheduled Medications: . antiseptic oral rinse  7 mL Mouth Rinse BID  . Chlorhexidine Gluconate Cloth  6 each Topical Q0600  . dabigatran  75 mg Oral Q12H  . diltiazem  120 mg Oral Daily  . furosemide  40 mg Oral BID  . gabapentin  600 mg Oral QID  . insulin aspart  0-9 Units Subcutaneous TID WC  . insulin aspart  3 Units Subcutaneous TID WC  . levalbuterol  1.25 mg Nebulization Q6H WA  . mupirocin ointment  1 application Nasal BID  . potassium chloride SA  20 mEq Oral Daily  . predniSONE  60 mg Oral Q breakfast  . sodium chloride  3 mL Intravenous Q12H       PRN Medications: sodium chloride, acetaminophen, ondansetron (ZOFRAN) IV, sodium chloride   Assessment and Plan:   1. Atrial fib: Heart rate is well controlled on 120 mg daily.  Continue Pradaxa.No issues with bleeding. Hgb slightly lower than admission from 13.0 to 11.5.   2. Acute CHF: Awaiting echo. It is downloaded now. Dr. Wyline MoodBranch will read today.She had diuresed 1.9 liters. Continues wheezing but not related to CHF on examination. Continues on lasix 40 mg BID. Creatinine 0.82 this am.   3. COPD: Continues coughing productively with discolored sputum. . Dr. Sherrie MustacheFisher aware and plans to institute  abx therapy today.      Bettey MareKathryn M. Lawrence NP [facility]  10/19/2014, 9:52 AM   Patient seen and discussed with NP Lyman BishopLawrence, I agree with her documentation above. Rate controlled on dilt 120mg  daily, continue pradaxa for stroke prevention. Acute CHF, she is negative 1.9 liters since admission, she is now on oral lasix 40mg  bid. Likely change to once a day lasix tomorrow or potentially at discharge. Will f/u echo results.   Dominga FerryJ Jahid Weida MD

## 2014-10-19 NOTE — Progress Notes (Signed)
[facility] PROGRESS NOTE  Carrie Heath ZOX:[id number] DOB: 1921/03/16 DOA: 10/15/2014 PCP: Nita SellsHALL,JOHN Heath    Code Status: DO NOT RESUSCITATE Family Communication: Discussed with patient, family not available. Disposition Plan: Discharge to assisted living facility when clinically appropriate.   Consultants:  Cardiology  Procedures:  2-D echocardiogram: Study Conclusions - Left ventricle: The cavity size was normal. Wall thickness was increased in a pattern of moderate LVH. Systolic function was vigorous. The estimated ejection fraction was in the range of 65% to 70%. Wall motion was normal; there were no regional wall motion abnormalities. The study was not technically sufficient to allow evaluation of LV diastolic dysfunction due to atrial fibrillation. - Aortic valve: Mildly calcified annulus. Trileaflet; mildly thickened leaflets. There was mild regurgitation. Valve area (VTI): 2.05 cm^2. Valve area (Vmax): 1.93 cm^2. - Mitral valve: Moderately calcified annulus. Mildly thickened leaflets . There was mild regurgitation. - Left atrium: The atrium was severely dilated. - Right ventricle: The cavity size was moderately to severely dilated. Systolic function was low normal. RV TAPSE is 1.6 cm. - Right atrium: The atrium was severely dilated. - Atrial septum: There was a patent foramen ovale with evidence of left to right shunting. - Tricuspid valve: There was moderate regurgitation. - Pulmonary arteries: Systolic pressure was moderately increased. PA peak pressure: 39 mm Hg (S). - Technically adequate study.  Antibiotics:  Starting Rocephin and azithromycin 12/9>>  HPI/Subjective:  Patient says that she feels better. She continues to have a cough, much less productive. She denies chest pain.  Objective: Filed Vitals:   10/19/14 0317  BP: 125/63  Pulse: 82  Temp: 98 F (36.7 C)  Resp: 20   oxygen saturation 95% on 2 L nasal  oxygen.   Intake/Output Summary (Last 24 hours) at 10/19/14 1333 Last data filed at 10/19/14 0900  Gross per 24 hour  Intake    480 ml  Output    860 ml  Net   -380 ml   Filed Weights   10/17/14 0500 10/18/14 0500 10/19/14 0317  Weight: 66.6 kg (146 lb 13.2 oz) 65.6 kg (144 lb 10 oz) 63.005 kg (138 lb 14.4 oz)    Exam:   General:  Pleasant 80 year old woman who looks younger than her stated age.  Cardiovascular: Irregular, irregular.  Respiratory: Rare wheezes and decreased breath sounds in the bases. Breathing nonlabored.  Abdomen: Positive bowel sounds, soft, nontender, nondistended.  Musculoskeletal: No acute hot red joints. No pedal edema.   Data Reviewed: Basic Metabolic Panel:  Recent Labs Lab 10/15/14 2217 10/17/14 0539 10/18/14 0424 10/18/14 1007 10/19/14 0559  NA 133* 130* 135*  --  137  K 4.2 3.9 3.1*  --  4.0  CL 88* 85* 89*  --  94*  CO2 32 31 35*  --  33*  GLUCOSE 132* 119* 128*  --  127*  BUN 12 19 19   --  15  CREATININE 0.73 0.82 0.81  --  0.82  CALCIUM 9.1 9.0 8.7  --  8.8  MG  --  1.9  --  1.7  --    Liver Function Tests:  Recent Labs Lab 10/15/14 2217  AST 21  ALT 13  ALKPHOS 91  BILITOT 0.3  PROT 7.6  ALBUMIN 3.9   No results for input(s): LIPASE, AMYLASE in the last 168 hours. No results for input(s): AMMONIA in the last 168 hours. CBC:  Recent Labs Lab 10/15/14 2217 10/19/14 0559  WBC 12.2* 10.8*  NEUTROABS 9.0*  --  HGB 13.0 11.5*  HCT 39.2 35.0*  MCV 94.7 95.1  PLT 246 232   Cardiac Enzymes:  Recent Labs Lab 10/15/14 2217 10/16/14 0158 10/16/14 0703 10/16/14 1312  TROPONINI <0.30 <0.30 <0.30 <0.30   BNP (last 3 results)  Recent Labs  04/28/14 0155 10/15/14 2217  PROBNP 1678.0* 2147.0*   CBG:  Recent Labs Lab 10/18/14 1134 10/18/14 1619 10/18/14 2022 10/19/14 0732 10/19/14 1200  GLUCAP 197* 97 137* 128* 188*    Recent Results (from the past 240 hour(s))  MRSA PCR Screening     Status:  Abnormal   Collection Time: 10/16/14 12:56 AM  Result Value Ref Range Status   MRSA by PCR POSITIVE (A) NEGATIVE Final    Comment: RESULT CALLED TO, READ BACK BY AND VERIFIED WITH: NIELSON T. AT 0656A ON [id number] BY THOMPSON S.        The GeneXpert MRSA Assay (FDA approved for NASAL specimens only), is one component of a comprehensive MRSA colonization surveillance program. It is not intended to diagnose MRSA infection nor to guide or monitor treatment for MRSA infections.      Studies: No results found.  Scheduled Meds: . antiseptic oral rinse  7 mL Mouth Rinse BID  . azithromycin  500 mg Intravenous Q24H  . cefTRIAXone (ROCEPHIN)  IV  1 g Intravenous Q24H  . Chlorhexidine Gluconate Cloth  6 each Topical Q0600  . dabigatran  75 mg Oral Q12H  . diltiazem  120 mg Oral Daily  . furosemide  40 mg Oral BID  . gabapentin  600 mg Oral QID  . insulin aspart  0-9 Units Subcutaneous TID WC  . insulin aspart  3 Units Subcutaneous TID WC  . levalbuterol  1.25 mg Nebulization Q6H WA  . mupirocin ointment  1 application Nasal BID  . potassium chloride SA  20 mEq Oral Daily  . predniSONE  60 mg Oral Q breakfast  . sodium chloride  3 mL Intravenous Q12H   Continuous Infusions:    Assessment and plan:  Principal Problem:   Atrial fibrillation with RVR Active Problems:   DM type 2 with diabetic peripheral neuropathy   Acute on chronic diastolic CHF (congestive heart failure)   Acute bronchitis with chronic obstructive pulmonary disease (COPD)   SOB (shortness of breath)    1. Chronic atrial fibrillation with rapid ventricular response. Rate has improved. Status post diltiazem drip. Now on oral daily diltiazem per cardiology. Will continue Pradaxa. Echocardiogram reveals preserved LV function, but with right heart/right atrium dilatation. TSH was within normal limits.  Acute on chronic diastolic heart failure. Echocardiogram reveals an ejection fraction of 65-70% and right  heart dilatation; presumed diastolic dysfunction. Ins and outs likely not recorded accurately, however, she has likely diuresed over 2 L. Continue oral furosemide as ordered; status post IV furosemide.  Acute bronchitis with underlying COPD; chronic steroid dependency. Given her previous productive discolored sputum, will add azithromycin and Rocephin empirically. Continue prednisone with the plan to taper tomorrow. Lung exam today reveals only rare wheezes per my auscultation.  Diabetes mellitus, type II with diabetic peripheral neuropathy. CBGs are increasing on prednisone. We'll change sliding scale NovoLog to moderate scale and add Lantus while she is on prednisone. We'll order hemoglobin A1c. Continue gabapentin.  Hypokalemia, secondary to Lasix. Currently repleted. Magnesium level I.7. We'll continue to monitor and supplement potassium chloride as needed.   Will order PT evaluation and asked the nursing staff to get the patient out of bed to the chair.  Time spent: 30 minutes    [facility]  [facility] Pager [phone]. If 7PM-7AM, please contact night-coverage at [email or web address], password [facility] 10/19/2014, 1:33 PM  LOS: 4 days

## 2014-10-19 NOTE — Plan of Care (Signed)
Problem: Phase II Progression Outcomes Goal: Ventricular heart rate < 100/min Outcome: Completed/Met Date Met:  10/19/14 Goal: Anticoagulation Therapy per MD order Outcome: Completed/Met Date Met:  10/19/14 Goal: Pain controlled Outcome: Completed/Met Date Met:  10/19/14 Goal: Progress activity as tolerated unless otherwise ordered Outcome: Progressing Goal: Tolerating diet Outcome: Completed/Met Date Met:  10/19/14

## 2014-10-19 NOTE — Progress Notes (Signed)
Physical Therapy Treatment Patient Details Name: Carrie Heath MRN: [id number] DOB: 10-Sep-1921 Today's Date: 10/19/2014    History of Present Illness 80 yo female h/o copd, dm, ??chf (reports she is on lasix for her leg swelling), htn comes in with sudden onset of sob tonight while she was lying down.  She says she just "couldnt' catch her breath".  Denies cp or palpirations.  Cannot lie down flat or her sob gets worse.  She does have a cough that is worse with lying down.  No fevers.  No chills. She does also have le edema which she says has been there since July and has not changed.  She feels better with lasix and oxygen given to her in the ED.  She has diuresed some, not measured in the ED.  Lives in assisted living.    PT Comments    Pt agreeable to PT treatment, and reports she is not having difficulty with breathing today (though pt is still on supplemental O2 at 2.5L).  Pt reports she has been up and out of bed today to the bathroom with assist x1 and use of RW.  Pt was able to participate in supine and seated exercises without complaints of pain or SOB.  Pt does report a hx of Rt > Lt knee OA and pain, though pain is reported in standing/gait.  Noted improved gait distance today to 120' with RW, though gait O2 sats did jump between 86-91% with gait without supplemental O2 and distance was stopped due to drop.  O2 reapplied at EOB, and O2 increased to 95%.  Recommend continued PT to address activity tolerance and strengthening for improved functional mobility skills with return to ALF with HHPT.     Follow Up Recommendations  Home health PT     Equipment Recommendations  None recommended by PT       Precautions / Restrictions Precautions Precautions: Fall Restrictions Weight Bearing Restrictions: No    Mobility  Bed Mobility Overal bed mobility: Modified Independent                Transfers Overall transfer level: Needs assistance Equipment used: Rolling walker (2  wheeled)   Sit to Stand: Supervision            Ambulation/Gait Ambulation/Gait assistance: Modified independent (Device/Increase time);Supervision Ambulation Distance (Feet): 120 Feet Assistive device: Rolling walker (2 wheeled) Gait Pattern/deviations: Step-through pattern;Trunk flexed;Decreased stride length   Gait velocity interpretation: Below normal speed for age/gender General Gait Details: Pt reports her normal gait velocity is slow.  Noted slight antalgic gait pattern on Rt LE during gait; pt reports hx of (B) knee OA for the past 6 years.         Cognition Arousal/Alertness: Awake/alert Behavior During Therapy: WFL for tasks assessed/performed Overall Cognitive Status: Within Functional Limits for tasks assessed                      Exercises General Exercises - Lower Extremity Ankle Circles/Pumps: AROM;Both;10 reps;Supine Quad Sets: Strengthening;Both;10 reps;Supine Long Arc Quad: Strengthening;Both;10 reps;Supine Heel Slides: AROM;Both;10 reps;Supine Hip ABduction/ADduction: AROM;Both;10 reps;Supine Hip Flexion/Marching: Strengthening;Both;10 reps;Seated        Pertinent Vitals/Pain Pain Assessment: No/denies pain           PT Goals (current goals can now be found in the care plan section) Progress towards PT goals: Progressing toward goals    Frequency  Min 3X/week    PT Plan Current plan remains appropriate  End of Session Equipment Utilized During Treatment: Gait belt Activity Tolerance: Other (comment) (Gait distance limited by drop in O2 sats to 86%) Patient left: in bed;with call bell/phone within reach;with bed alarm set     Time: 1520-1559 PT Time Calculation (min) (ACUTE ONLY): 39 min  Charges:  $Gait Training: 8-22 mins $Therapeutic Exercise: 23-37 mins                     Demosthenes Virnig 10/19/2014, 4:09 PM

## 2014-10-20 ENCOUNTER — Inpatient Hospital Stay (HOSPITAL_COMMUNITY): Payer: Medicare Other

## 2014-10-20 LAB — BASIC METABOLIC PANEL
Anion gap: 13 (ref 5–15)
BUN: 17 mg/dL (ref 6–23)
CHLORIDE: 93 meq/L — AB (ref 96–112)
CO2: 31 meq/L (ref 19–32)
Calcium: 8.9 mg/dL (ref 8.4–10.5)
Creatinine, Ser: 0.83 mg/dL (ref 0.50–1.10)
GFR calc Af Amer: 69 mL/min — ABNORMAL LOW (ref >90)
GFR calc non Af Amer: 60 mL/min — ABNORMAL LOW (ref >90)
GLUCOSE: 113 mg/dL — AB (ref 70–99)
POTASSIUM: 3.8 meq/L (ref 3.7–5.3)
Sodium: 137 mEq/L (ref 137–147)

## 2014-10-20 LAB — GLUCOSE, CAPILLARY
GLUCOSE-CAPILLARY: 117 mg/dL — AB (ref 70–99)
GLUCOSE-CAPILLARY: 307 mg/dL — AB (ref 70–99)
Glucose-Capillary: 188 mg/dL — ABNORMAL HIGH (ref 70–99)
Glucose-Capillary: 243 mg/dL — ABNORMAL HIGH (ref 70–99)

## 2014-10-20 LAB — CBC
HEMATOCRIT: 33.3 % — AB (ref 36.0–46.0)
Hemoglobin: 11.1 g/dL — ABNORMAL LOW (ref 12.0–15.0)
MCH: 31.3 pg (ref 26.0–34.0)
MCHC: 33.3 g/dL (ref 30.0–36.0)
MCV: 93.8 fL (ref 78.0–100.0)
Platelets: 230 10*3/uL (ref 150–400)
RBC: 3.55 MIL/uL — AB (ref 3.87–5.11)
RDW: 13.7 % (ref 11.5–15.5)
WBC: 11.3 10*3/uL — ABNORMAL HIGH (ref 4.0–10.5)

## 2014-10-20 MED ORDER — INSULIN ASPART 100 UNIT/ML ~~LOC~~ SOLN
0.0000 [IU] | Freq: Three times a day (TID) | SUBCUTANEOUS | Status: DC
  Administered 2014-10-20: 4 [IU] via SUBCUTANEOUS

## 2014-10-20 MED ORDER — INSULIN ASPART 100 UNIT/ML ~~LOC~~ SOLN: 0.0000 [IU] | Freq: Every day | SUBCUTANEOUS | Status: DC

## 2014-10-20 MED ORDER — FUROSEMIDE 40 MG PO TABS: 40.0000 mg | ORAL_TABLET | Freq: Every day | ORAL | Status: DC

## 2014-10-20 MED ORDER — AZITHROMYCIN 250 MG PO TABS: ORAL_TABLET | ORAL | Status: DC

## 2014-10-20 MED ORDER — FAMOTIDINE 20 MG PO TABS: 20.0000 mg | ORAL_TABLET | Freq: Every day | ORAL | Status: DC

## 2014-10-20 MED ORDER — INSULIN ASPART 100 UNIT/ML ~~LOC~~ SOLN
0.0000 [IU] | Freq: Every day | SUBCUTANEOUS | Status: DC
  Administered 2014-10-20: 2 [IU] via SUBCUTANEOUS

## 2014-10-20 MED ORDER — LEVALBUTEROL HCL 1.25 MG/0.5ML IN NEBU: 1.2500 mg | INHALATION_SOLUTION | Freq: Two times a day (BID) | RESPIRATORY_TRACT | Status: DC

## 2014-10-20 MED ORDER — INSULIN ASPART 100 UNIT/ML ~~LOC~~ SOLN
10.0000 [IU] | Freq: Once | SUBCUTANEOUS | Status: AC
  Administered 2014-10-20: 10 [IU] via SUBCUTANEOUS

## 2014-10-20 MED ORDER — BENZONATATE 100 MG PO CAPS: 100.0000 mg | ORAL_CAPSULE | Freq: Three times a day (TID) | ORAL | Status: DC

## 2014-10-20 MED ORDER — DILTIAZEM HCL ER COATED BEADS 120 MG PO CP24: 120.0000 mg | ORAL_CAPSULE | Freq: Every day | ORAL | Status: DC

## 2014-10-20 MED ORDER — PREDNISONE 10 MG PO TABS: ORAL_TABLET | ORAL | Status: DC

## 2014-10-20 MED ORDER — PREDNISONE 2.5 MG PO TABS: ORAL_TABLET | ORAL | Status: DC

## 2014-10-20 NOTE — Clinical Social Work Note (Signed)
Pt d/c today back to [facility]. Pt, son Jillyn HiddenGary, and facility aware and agreeable. D/C summary and FL2 faxed. Facility to provide transport.   Derenda FennelKara Charlene Detter, [place]CS[place] [phone]

## 2014-10-20 NOTE — Progress Notes (Signed)
Inpatient Diabetes Program Recommendations  AACE/ADA: New Consensus Statement on Inpatient Glycemic Control (2013)  Target Ranges:  Prepandial:   less than 140 mg/dL      Peak postprandial:   less than 180 mg/dL (1-2 hours)      Critically ill patients:  140 - 180 mg/dL   Results for Carrie DerbySTADLER, Laverna T (MRN [id number]) as of 10/20/2014 07:54  Ref. Range 10/19/2014 07:32 10/19/2014 12:00 10/19/2014 16:42 10/19/2014 21:07 10/20/2014 07:12  Glucose-Capillary Latest Range: 70-99 mg/dL 191128 (H) 478188 (H) 295225 (H) 238 (H) 117 (H)   Diabetes history: DM2 Outpatient Diabetes medications: Metformin 500 mg BID Current orders for Inpatient glycemic control: Lantus 7 units QHS, Novolog 0-5 units HS  Inpatient Diabetes Program Recommendations Correction (SSI): Noted only bedtime Novolog correction is ordered. Please consider ordering Novolog correction scale TID with meals.  Thanks, Orlando PennerMarie Andreus Cure, RN, MSN, CCRN, CDE Diabetes Coordinator Inpatient Diabetes Program [phone] (Team Pager) [phone] ([facility] office) [phone] [facility] office)

## 2014-10-20 NOTE — Progress Notes (Signed)
Pt has orders for discharge. Care management set-up oxygen with discharge and arrangements with assisted living. Pt is ready. Awaiting delivery of oxygen

## 2014-10-20 NOTE — Progress Notes (Addendum)
Patient ID: Carrie Heath, female   DOB: 1921/08/16, 80 y.o.   MRN: [id number]    Subjective:   SOB improving   Objective:   Temp:  [97.7 F (36.5 C)-98 F (36.7 C)] 98 F (36.7 C) (12/10 0700) Pulse Rate:  [78-83] 83 (12/10 0700) Resp:  [16-20] 18 (12/10 0700) BP: (97-125)/(58-69) 111/58 mmHg (12/10 0700) SpO2:  [86 %-97 %] 89 % (12/10 0848) Weight:  [128 lb (58.06 kg)] 128 lb (58.06 kg) (12/10 0614) Last BM Date: 10/19/14  Filed Weights   10/18/14 0500 10/19/14 0317 10/20/14 [id number]  Weight: 144 lb 10 oz (65.6 kg) 138 lb 14.4 oz (63.005 kg) 128 lb (58.06 kg)    Intake/Output Summary (Last 24 hours) at 10/20/14 0959 Last data filed at 10/19/14 1902  Gross per 24 hour  Intake    360 ml  Output    100 ml  Net    260 ml    Tele: afib rates 70-80s  Exam:  General: NAD  Resp: + bilateral wheezing  Cardiac: irreg, no m/r/g, no JVD  ZD:GUYQIHKGI:abdomen soft, NT, ND  MSK:no LE edema  Neuro: no focal deficits    Lab Results:  Basic Metabolic Panel:  Recent Labs Lab 10/17/14 0539 10/18/14 0424 10/18/14 1007 10/19/14 0559 10/20/14 0550  NA 130* 135*  --  137 137  K 3.9 3.1*  --  4.0 3.8  CL 85* 89*  --  94* 93*  CO2 31 35*  --  33* 31  GLUCOSE 119* 128*  --  127* 113*  BUN 19 19  --  15 17  CREATININE 0.82 0.81  --  0.82 0.83  CALCIUM 9.0 8.7  --  8.8 8.9  MG 1.9  --  1.7  --   --     Liver Function Tests:  Recent Labs Lab 10/15/14 2217  AST 21  ALT 13  ALKPHOS 91  BILITOT 0.3  PROT 7.6  ALBUMIN 3.9    CBC:  Recent Labs Lab 10/15/14 2217 10/19/14 0559 10/20/14 0550  WBC 12.2* 10.8* 11.3*  HGB 13.0 11.5* 11.1*  HCT 39.2 35.0* 33.3*  MCV 94.7 95.1 93.8  PLT 246 232 230    Cardiac Enzymes:  Recent Labs Lab 10/16/14 0158 10/16/14 0703 10/16/14 1312  TROPONINI <0.30 <0.30 <0.30    BNP:  Recent Labs  04/28/14 0155 10/15/14 2217  PROBNP 1678.0* 2147.0*    Coagulation: No results for input(s): INR in the last 168  hours.  ECG:   Medications:   Scheduled Medications: . antiseptic oral rinse  7 mL Mouth Rinse BID  . azithromycin  500 mg Intravenous Q24H  . cefTRIAXone (ROCEPHIN)  IV  1 g Intravenous Q24H  . dabigatran  75 mg Oral Q12H  . diltiazem  120 mg Oral Daily  . famotidine  20 mg Oral Daily  . furosemide  40 mg Oral BID  . gabapentin  600 mg Oral QID  . insulin aspart  0-5 Units Subcutaneous QHS  . insulin glargine  7 Units Subcutaneous QHS  . levalbuterol  1.25 mg Nebulization Q6H WA  . mupirocin ointment  1 application Nasal BID  . potassium chloride SA  20 mEq Oral BID  . predniSONE  50 mg Oral Q breakfast  . sodium chloride  3 mL Intravenous Q12H     Infusions:     PRN Medications:  sodium chloride, acetaminophen, ondansetron (ZOFRAN) IV, sodium chloride     Assessment/Plan    1. Atrial fibrillation -  rate controlled with dilt 120mg  daily, she is on pradaxa for stroke prevention - continue current meds  2. Acute onc diastolic heart failure - echo 40/07/8111/6/15 LVEF 65-70%, could not eval diastolic function due to afib but biatrial enlargement suggestive. She also has RV dilatation likely due to underlying left sided diastolic dysfunction and her COPD. - currently on lasix oral 40mg  bid, uop not documented yesterday. Repeat CXR after initial diuresis showed resolution of pulmonary edema, no significant evidence on exam of continued volume overlad - decrease lasix to 40mg  daily as her maintenence dose.   3. COPD - exacerbation main etiology of SOB, management per primary team   Will sign off of inpatient care, will have our office arrange f/u with NP Lawrence in 2 weeks.   Dina RichJonathan Taylen Wendland, M.D.

## 2014-10-20 NOTE — Discharge Summary (Signed)
Physician Discharge Summary  Carrie DerbyLouise T Heath ZOX:[id number] DOB: 1920/12/25 DOA: 10/15/2014  PCP: Nita SellsHALL,[name]  Admit date: 10/15/2014 Discharge date: 10/20/2014  Time spent: Greater than 30 minutes  Recommendations for Outpatient Follow-up:  1. The patient should follow-up with cardiology and pulmonology as scheduled. 2. Recommend checking her capillary blood glucose at least once or twice daily.  3. The patient was discharged on nasal cannula 2 L of nasal cannula oxygen; she needs to wear it continuously. Recommend rechecking her oxygen saturations on room air in one week. 4. Home health physical therapy has been ordered. 5. Patient is being discharged to assisted living facility.  Discharge Diagnoses:  1. Chronic atrial fibrillation with RVR. 2. Acute on chronic diastolic heart failure. Ejection fraction was 65-70%. 3. Patent foramen ovale. 4. Right ventricle dilatation. 5. COPD with acute bronchitic exacerbation. 6. Type 2 diabetes mellitus with diabetic peripheral neuropathy.   Discharge Condition: Improved  Diet recommendation: Heart healthy/carbohydrate modified.  Filed Weights   10/18/14 0500 10/19/14 0317 10/20/14 0614  Weight: 65.6 kg (144 lb 10 oz) 63.005 kg (138 lb 14.4 oz) 58.06 kg (128 lb)    History of present illness:  The patient is a 80 year old woman with a history of COPD, diabetes mellitus, and chronic H or fibrillation, who presented to the emergency department on 10/15/2014 with a chief complaint of shortness of breath. In the ED, and she was afebrile and mildly hypertensive. She was tachycardic with a heart rate ranging from 108-122 bpm. Her chest x-ray revealed vascular congestion and mild interstitial edema. Her troponin I was negative. Her proBNP was 2147. She was admitted for further evaluation and management.  Hospital Course:   1. Chronic atrial fibrillation with rapid ventricular response. Rate has improved. The patient was started on a diltiazem  drip. Anticoagulation was continued with Pradaxa. Cardiology was consulted. Following their evaluation, diltiazem drip was discontinued in favor of oral diltiazem when her heart rate normalized. Albuterol nebulizer was discontinued in favor of Xopenex. Her Echocardiogram revealed preserved LV function, but with right heart/right atrium dilatation. Per cardiologist Dr. Wyline Moodbranch, the patient's right heart dilatation was likely secondary to left-sided diastolic dysfunction and her COPD. The patient's troponin I was within normal limits. Her TSH was within normal limits. She was hemodynamically stable at the time of discharge and her heart rate was ranging from 78-82 bpm.  Acute on chronic diastolic heart failure. Her echocardiogram revealed an ejection fraction of 65-70% and right heart dilatation; presumed diastolic dysfunction. She was given IV for also might for a couple of days before was transitioned to oral Lasix twice a day. Cardiology recommended resuming her home dose of 40 mg daily at the time of discharge. She diuresed 2.2 L. Her weight was 128 pounds at the time of discharge (?? Weight 144 pounds on admission).  Acute bronchitis with underlying COPD; chronic steroid dependency. The patient complained of a productive cough with off-colored sputum. She was also noted to be wheezing. As stated above, albuterol was discontinued in favor of Xopenex because of her rapid ventricular rate. Prednisone was started as well as Rocephin and azithromycin. Her follow-up chest x-ray prior to discharge revealed no acute cardiopulmonary abnormalities. She will be continued on prednisone, tapered down to her 2.5 mg dose daily, several more days of azithromycin, and Xopenex nebulizer. She will follow-up with her pulmonologist Dr. Maple HudsonYoung as scheduled.  Acute respiratory failure with hypoxia, secondary to COPD exacerbation and CHF. Her oxygen saturation on room air and with ambulation was 85%  prior to discharge.  Therefore, she was discharged on oxygen at 2 L/m which improved her oxygen saturations to 93% at rest. Recommend rechecking her oxygen saturations periodically in the next few weeks to see if she still needs supplemental oxygen.  Diabetes mellitus, type II with diabetic peripheral neuropathy. Her CBGs were increasing on prednisone. Therefore, sliding scale NovoLog and Lantus were ordered. She was continued on gabapentin. At the time of discharge, metformin was resumed. Recommend close monitoring of her CBGs on the prednisone taper at the nursing facility. Consider sliding scale NovoLog if it remains elevated.  Hypokalemia, secondary to Lasix. She was supplemented with potassium chloride. Her magnesium level was within normal limits. She will continue on potassium chloride supplementation as prior to this hospitalization. Her serum potassium was 3.8 at the time of discharge.    Procedures:  2-D echocardiogram: Study Conclusions - Left ventricle: The cavity size was normal. Wall thickness was increased in a pattern of moderate LVH. Systolic function was vigorous. The estimated ejection fraction was in the range of 65% to 70%. Wall motion was normal; there were no regional wall motion abnormalities. The study was not technically sufficient to allow evaluation of LV diastolic dysfunction due to atrial fibrillation. - Aortic valve: Mildly calcified annulus. Trileaflet; mildly thickened leaflets. There was mild regurgitation. Valve area (VTI): 2.05 cm^2. Valve area (Vmax): 1.93 cm^2. - Mitral valve: Moderately calcified annulus. Mildly thickened leaflets . There was mild regurgitation. - Left atrium: The atrium was severely dilated. - Right ventricle: The cavity size was moderately to severely dilated. Systolic function was low normal. RV TAPSE is 1.6 cm. - Right atrium: The atrium was severely dilated. - Atrial septum: There was a patent foramen ovale with evidence  of left to right shunting. - Tricuspid valve: There was moderate regurgitation. - Pulmonary arteries: Systolic pressure was moderately increased. PA peak pressure: 39 mm Hg (S). - Technically adequate study.  Consultations:  Cardiology  Discharge Exam: Filed Vitals:   10/20/14 1429  BP: 135/66  Pulse: 82  Temp: 97.8 F (36.6 C)  Resp: 18    General: Pleasant 80 year old sitting up in bed, in no acute distress. Cardiovascular: Irregular, irregular Respiratory: Significantly fewer bronchospasms and crackles, mostly clear. Breathing is nonlabored.  Discharge Instructions You were cared for by a hospitalist during your hospital stay. If you have any questions about your discharge medications or the care you received while you were in the hospital after you are discharged, you can call the unit and asked to speak with the hospitalist on call if the hospitalist that took care of you is not available. Once you are discharged, your primary care physician will handle any further medical issues. Please note that NO REFILLS for any discharge medications will be authorized once you are discharged, as it is imperative that you return to your primary care physician (or establish a relationship with a primary care physician if you do not have one) for your aftercare needs so that they can reassess your need for medications and monitor your lab values.  Discharge Instructions    Diet - low sodium heart healthy    Complete by:  As directed      Diet Carb Modified    Complete by:  As directed      Increase activity slowly    Complete by:  As directed           Current Discharge Medication List    START taking these medications   Details  azithromycin (  ZITHROMAX) 250 MG tablet Starting tomorrow, take 2 tablets daily for 1 day and then 1 tablet daily thereafter until completed. Qty: 6 each, Refills: 0    benzonatate (TESSALON PERLES) 100 MG capsule Take 1 capsule (100 mg total) by mouth  3 (three) times daily. Qty: 20 capsule, Refills: 0    diltiazem (CARDIZEM CD) 120 MG 24 hr capsule Take 1 capsule (120 mg total) by mouth daily. Qty: 30 capsule, Refills: 3    famotidine (PEPCID) 20 MG tablet Take 1 tablet (20 mg total) by mouth daily. Qty: 30 tablet, Refills: 3    levalbuterol (XOPENEX) 1.25 MG/0.5ML nebulizer solution Take 1.25 mg by nebulization 2 (two) times daily. Qty: 1 each, Refills: 12      CONTINUE these medications which have CHANGED   Details  !! predniSONE (DELTASONE) 10 MG tablet Starting tomorrow, take 5 tablets daily for 1 day; then 4 tablets daily for 1 day; then 3 tablets daily for 1 day; then 2 tablets daily for 1 day; then 1 tablet daily for 1 day; then stop. (Then restart normal home dose of 2.5 mg daily.) Qty: 15 tablet, Refills: 0    !! predniSONE (DELTASONE) 2.5 MG tablet RESTART THIS MEDICATION AT 1 TAB DAILY IN 5 DAYS, AFTER PREDNISONE TAPER IS COMPLETED.     !! - Potential duplicate medications found. Please discuss with provider.    CONTINUE these medications which have NOT CHANGED   Details  CALCIUM CITRATE PO Take 2 tablets by mouth daily.    dabigatran (PRADAXA) 75 MG CAPS Take 1 capsule (75 mg total) by mouth every 12 (twelve) hours. Qty: 180 capsule, Refills: 1    furosemide (LASIX) 40 MG tablet Take 40 mg by mouth daily.    gabapentin (NEURONTIN) 600 MG tablet Take 600 mg by mouth 4 (four) times daily.    Glucosamine-Chondroit-Vit C-Mn (GLUCOSAMINE CHONDROITIN COMPLX) CAPS Take 3 capsules by mouth 2 (two) times daily.     guaiFENesin (MUCINEX) 600 MG 12 hr tablet Take 600 mg by mouth 2 (two) times daily.    ibuprofen (ADVIL,MOTRIN) 200 MG tablet Take 400 mg by mouth 3 (three) times daily as needed (pain).    metFORMIN (GLUCOPHAGE) 500 MG tablet Take 500 mg by mouth 2 (two) times daily with a meal.      Multiple Vitamins-Minerals (ABC PLUS SENIOR) TABS Take 1 tablet by mouth daily.    Omega-3 Fatty Acids (FISH OIL) 1000 MG  CAPS Take 1 capsule by mouth daily.     omeprazole (PRILOSEC) 20 MG capsule Take 1 capsule by mouth daily.    potassium chloride SA (K-DUR,KLOR-CON) 20 MEQ tablet Take 20 mEq by mouth daily.    traMADol (ULTRAM) 50 MG tablet Take 25 mg by mouth 2 (two) times daily as needed (pain).    albuterol (PROAIR HFA) 108 (90 BASE) MCG/ACT inhaler Inhale 2 puffs into the lungs every 4 (four) hours as needed for wheezing or shortness of breath. Qty: 8.5 g, Refills: 11    Respiratory Therapy Supplies (FLUTTER) DEVI Blow through 4 times, and do this 3 times daily when needed Qty: 1 each, Refills: 0   Associated Diagnoses: Chronic airway obstruction, not elsewhere classified; Other diseases of lung, not elsewhere classified      STOP taking these medications     ipratropium-albuterol (DUONEB) 0.5-2.5 (3) MG/3ML SOLN      Multiple Vitamins-Minerals (MULTI COMPLETE PO)      ipratropium (ATROVENT) 0.02 % nebulizer solution  No Known Allergies Follow-up Information    Follow up with Joni Reining, NP On 11/07/2014.   Specialty:  Nurse Practitioner   Why:  at 2:10 pm   Contact information:   [address] [place] [address] [phone]       Follow up with Waymon Budge, MD On 11/01/2014.   Specialty:  Pulmonary Disease   Why:  at 11:00 am   Contact information:   [address]  [facility] [place] [address] [phone]        The results of significant diagnostics from this hospitalization (including imaging, microbiology, ancillary and laboratory) are listed below for reference.    Significant Diagnostic Studies: Dg Chest 2 View  10/20/2014   EXAM: CHEST  2 VIEW  COMPARISON:  None.  FINDINGS: The heart size and mediastinal contours are within normal limits. Both lungs are clear. The visualized skeletal structures are unremarkable.  IMPRESSION: No active cardiopulmonary disease.   Electronically Signed   By: Alcide Clever M.D.   On: 10/20/2014  14:54   Dg Chest 2 View  10/16/2014   CLINICAL DATA:  Cough.  Shortness of breath.  EXAM: CHEST  2 VIEW  COMPARISON:  10/15/2014  FINDINGS: Mildly tortuous thoracic aorta. Subsegmental atelectasis at both lung bases. Mild enlargement of the cardiopericardial silhouette. No edema.  Thoracic spondylosis is present.  The pulmonary nodules shown on prior chest CTs are not well seen on today's chest radiograph.  IMPRESSION: 1. This patient had multiple pulmonary nodules measuring up to 10 mm average size, shown to be stable over a 9 month period in 2010, but if there were followed up afterwards within the [facility] system. If there was no CT followup elsewhere after the 08/29/2009 exam, then I would recommend a chest CT just to be careful and ensure that there is no progressive growth to indicate a low-grade malignancy. 2. Mild enlargement of the cardiopericardial silhouette, without edema. 3. Tortuous thoracic aorta. 4. Bibasilar subsegmental atelectasis, without overt consolidation identified. 5. Thoracic spondylosis.   Electronically Signed   By: Herbie Baltimore M.D.   On: 10/16/2014 18:07   Dg Chest Portable 1 View  10/15/2014   CLINICAL DATA:  Acute onset of shortness of breath and weakness for 3 days, progressively worsening. Nonproductive cough. Initial encounter.  EXAM: PORTABLE CHEST - 1 VIEW  COMPARISON:  Chest radiograph performed 04/28/2014  FINDINGS: The lungs are well-aerated. Vascular congestion is noted. Increased interstitial markings raise concern for mild interstitial edema. A small left pleural effusion is suspected. No pneumothorax is seen.  The cardiomediastinal silhouette is borderline normal in size. No acute osseous abnormalities are seen.  IMPRESSION: Vascular congestion noted. Increased interstitial markings raise concern for mild interstitial edema. Suspect small left pleural effusion.   Electronically Signed   By: Roanna Raider M.D.   On: 10/15/2014 22:39    Microbiology: Recent  Results (from the past 240 hour(s))  MRSA PCR Screening     Status: Abnormal   Collection Time: 10/16/14 12:56 AM  Result Value Ref Range Status   MRSA by PCR POSITIVE (A) NEGATIVE Final    Comment: RESULT CALLED TO, READ BACK BY AND VERIFIED WITH: NIELSON T. AT [id number] ON [id number] BY THOMPSON S.        The GeneXpert MRSA Assay (FDA approved for NASAL specimens only), is one component of a comprehensive MRSA colonization surveillance program. It is not intended to diagnose MRSA infection nor to guide or monitor treatment for MRSA  infections.      Labs: Basic Metabolic Panel:  Recent Labs Lab 10/15/14 2217 10/17/14 0539 10/18/14 0424 10/18/14 1007 10/19/14 0559 10/20/14 0550  NA 133* 130* 135*  --  137 137  K 4.2 3.9 3.1*  --  4.0 3.8  CL 88* 85* 89*  --  94* 93*  CO2 32 31 35*  --  33* 31  GLUCOSE 132* 119* 128*  --  127* 113*  BUN 12 19 19   --  15 17  CREATININE 0.73 0.82 0.81  --  0.82 0.83  CALCIUM 9.1 9.0 8.7  --  8.8 8.9  MG  --  1.9  --  1.7  --   --    Liver Function Tests:  Recent Labs Lab 10/15/14 2217  AST 21  ALT 13  ALKPHOS 91  BILITOT 0.3  PROT 7.6  ALBUMIN 3.9   No results for input(s): LIPASE, AMYLASE in the last 168 hours. No results for input(s): AMMONIA in the last 168 hours. CBC:  Recent Labs Lab 10/15/14 2217 10/19/14 0559 10/20/14 0550  WBC 12.2* 10.8* 11.3*  NEUTROABS 9.0*  --   --   HGB 13.0 11.5* 11.1*  HCT 39.2 35.0* 33.3*  MCV 94.7 95.1 93.8  PLT 246 232 230   Cardiac Enzymes:  Recent Labs Lab 10/15/14 2217 10/16/14 0158 10/16/14 0703 10/16/14 1312  TROPONINI <0.30 <0.30 <0.30 <0.30   BNP: BNP (last 3 results)  Recent Labs  04/28/14 0155 10/15/14 2217  PROBNP 1678.0* 2147.0*   CBG:  Recent Labs Lab 10/19/14 1200 10/19/14 1642 10/19/14 2107 10/20/14 0712 10/20/14 1114  GLUCAP 188* 225* 238* 117* 307*       Signed:  Laiya Wisby  [facility] 10/20/2014, 4:23 PM

## 2014-10-20 NOTE — Progress Notes (Signed)
SATURATION QUALIFICATIONS: (This note is used to comply with regulatory documentation for home oxygen)  Patient Saturations on Room Air at Rest = 85%  Patient Saturations on Room Air while Ambulating = 85%  Patient Saturations on2Liters of oxygen while Ambulating =89%  Please briefly explain why patient needs home oxygen=

## 2014-10-20 NOTE — Progress Notes (Signed)
Portable O2 just arrived for pt for transportation to [facility]. Originally, the oxygen delivery company delivered only an O2 concentrator to [facility] and did not supply a portable O2 tank. MD order was made per protocol for home O2 portable and concentrated. Called [employer] oxygen company and they state that the order must be written to say "O2 2L concentrator and portable gas cylinder [place]". Pt must have portable O2 for transportation, and will use the concentrator once she arrives to [facility]. Oxygen company suggested making an arrangement with [facility]. [facility] states they did not have any available O2 tanks for the pt to borrow just for transportation nor were they willing to return a portable O2 tank of ours after she had been transported. MD ordered new specific order for portable oxygen. This order not sufficient for [facility]. Pt has been discharged for more than 3 hours awaiting oxygen delivery and transportation. AC notified

## 2014-10-21 NOTE — Care Management Utilization Note (Signed)
UR complete 

## 2014-10-24 ENCOUNTER — Ambulatory Visit: Payer: Medicare Other | Admitting: Internal Medicine

## 2014-10-28 ENCOUNTER — Telehealth: Payer: Self-pay | Admitting: Internal Medicine

## 2014-10-28 NOTE — Telephone Encounter (Signed)
Spoke with JJ. She reports this is okay as long as patient has a phone in the room.  Called made daughter aware. Nothing further needed

## 2014-11-01 ENCOUNTER — Ambulatory Visit (INDEPENDENT_AMBULATORY_CARE_PROVIDER_SITE_OTHER): Payer: Medicare Other | Admitting: Adult Health

## 2014-11-01 ENCOUNTER — Inpatient Hospital Stay: Payer: BC Managed Care – PPO | Admitting: Adult Health

## 2014-11-01 ENCOUNTER — Encounter: Payer: Self-pay | Admitting: Adult Health

## 2014-11-01 VITALS — BP 110/72 | HR 69 | Ht 62.0 in | Wt 135.8 lb

## 2014-11-01 DIAGNOSIS — J440 Chronic obstructive pulmonary disease with acute lower respiratory infection: Principal | ICD-10-CM

## 2014-11-01 DIAGNOSIS — J209 Acute bronchitis, unspecified: Secondary | ICD-10-CM

## 2014-11-01 DIAGNOSIS — I5033 Acute on chronic diastolic (congestive) heart failure: Secondary | ICD-10-CM

## 2014-11-01 DIAGNOSIS — J441 Chronic obstructive pulmonary disease with (acute) exacerbation: Secondary | ICD-10-CM

## 2014-11-01 MED ORDER — LEVALBUTEROL HCL 0.63 MG/3ML IN NEBU: 0.6300 mg | INHALATION_SOLUTION | RESPIRATORY_TRACT | Status: DC | PRN

## 2014-11-01 MED ORDER — BENZONATATE 100 MG PO CAPS: 100.0000 mg | ORAL_CAPSULE | Freq: Three times a day (TID) | ORAL | Status: DC | PRN

## 2014-11-01 NOTE — Progress Notes (Signed)
Patient ID: Carrie Heath, female    DOB: December 16, 1920, 80 y.o.   MRN: [id number]  HPI 04/02/11- [age] yoF followed for COPD and lung nodule, complicated by DM Last here December 05, 2010- note reviewed.  Here with son today, she complains mostly about arthritis pains. She hasn't needed prednisone in past week. Her breathing hasn't missed it. Hasn't needed O2 in a month or two from [facility]. Not needing her nebulizer machine.   07/30/11- 89 yoF followed for COPD and lung nodule, complicated by DM    Daughter here Fairly stable. Children encourage her to continue her Rosalyn GessBrovana and she takes 1/2 of a 1 mg prednisone tab. Tried off this summer and remembers restarting it because of cough and wheeze. She is vague about whether she has prednisone 1 vs 5 mg pills.  Still notes some wheeze.  CXR 05/30/2010- stable mild COPD, NAD, no nodules seen  01/27/12- 89 yoF followed for COPD and lung nodule, complicated by DM   Son here FOLLOWS FOR: reports still having some SOB occasionally, chest congestion and prod cough with foamy clear/cream colored mucus - but reports has not used the oxygen at home.  not using brovana daily Doesn't hear herself wheeze. Uses her nebulizer with Brovana about once a day. Uses pro air off-and-on, 0 to twice daily. Room air cleaner. Turned her oxygen back in because she wasn't using it. Daily prednisone now at 3 mg. Mostly notices exertional dyspnea when up for ADLs like bathroom. Wants print prescriptions for her medications, planning 4-6 week trip to [place].  09/14/12- 89 yoF followed for COPD and lung nodule, complicated by DM, AFib                  Son here SOB and wheezing at times; cough mostly when congested and before/after Brovana tx. Has had flu vaccine. Has seen Dr. Daleen SquibbWall for management of atrial fibrillation. Continues prednisone 2.5 mg daily for COPD. Without that, she gets wheeze and chest congestion. Using neb brovana once or twice daily with rare need for  rescue inhaler. No longer has oxygen at home. COPD assessment test (CAT) score 21/40  03/14/13- 89 yoF followed for COPD and lung nodule, complicated by DM, AFib                  Son here FOLLOWS FOR: not able to get brovana neb rx; will need ALL rx's refilled today as she is going out of state.  Son is here Rosalyn GessBrovana is too expensive. We discussed her medications and especially her nebulizer meds. She is going to [place] again this summer. Knees limit her walking. Little cough or phlegm now. CXR 01/28/12 IMPRESSION:  Chronic bronchitic changes.  Original Report Authenticated By: Lollie MarrowMARK A. BOLES, M.D.  09/13/13- 89 yoF followed for COPD and lung nodule, complicated by DM, AFib                  DIL here FOLLOWS FOR: pt states she still has to use rescue inhaler sometimes(BID); has good and bad days. Cough at night that sounds wet.  Cough and wheeze w/ weather change. Walks at home w/ walker and cane. Daily prednisone 1/2 x 5  Mg Using neb ipratropium 2-3 x/ day. Not using Perforomist or Brovana- too expensive.  Son needs FMLA  03/14/14- 89 yoF followed for COPD and lung nodule, complicated by DM, AFib                 Son here  FOLLOWS FOR:  Pt now [facility] in [place].  Reports increased sob, cough with yellow mucus, rattling and wheezing 2-3 weeks.  Discuss pred and other meds today She is now living in [facility] living in [place]. She manages her own inhalers and nebulizer. She had been off of maintenance prednisone and they recently restarted at 1 mg daily. G.radually increasing chest congestion and some yellow sputum. CXR 09/14/13 IMPRESSION:  Minimal atelectasis at the lung bases laterally. No visible  pulmonary nodules.  Electronically Signed  By: Geanie CooleyJim Maxwell M.D.  On: 09/13/2013 14:39  04/25/14- 89 yoF followed for COPD and lung nodule, complicated by DM, AFib                 Son here FOLLOWS FOR: pt states her breathing is stable.  Denies any cough/ congestion, sob, wheezing.    Now in assisted living and doing very well. Off prednisone. Occasional rescue inhaler. Not needing her nebulizer.  11/01/14 Post Hospital follow up  Was recently admitted 12/5 for acute CHF , afib with RVR and AB exacerbation. tx for rate control with diltiazem. Had aggressive diuresis w/ improvement .  tx w/ iv abx and steroids. Along with oxygen. D/c on O2. , pred taper and Zpak.  She remains very weak. Cough is some better.  No fever , chest pain, increased leg swelling.  Caregiver present with pt along with telephone conference with pts son.   Review of Systems-see HPI Constitutional:   No weight loss, night sweats,  Fevers, chills, +fatigue, lassitude. HEENT:   No headaches,  Difficulty swallowing,  Tooth/dental problems,  Sore throat,                No sneezing, itching, ear ache, nasal congestion, post nasal drip,  CV:  No chest pain, orthopnea, PND, swelling in lower extremities, anasarca, dizziness, +palpitations GI  No heartburn, indigestion, abdominal pain, nausea, vomiting,  Resp: + shortness of breath with exertion or at rest.  No excess mucus, no- productive cough,              + non-productive cough,  No- coughing up of blood.  No-change in color of mucus.  Occasional                    wheezing.  Skin: no rash or lesions. GU: no dysuria, . MS: No acute pain. Psych:  No change in mood or affect. No depression or anxiety.  No memory loss.  Objective:  General- Alert, Oriented, Affect-appropriate, Distress- none acute. +Wheelchair Skin- rash-none, lesions- none, excoriation- none Lymphadenopathy- none Head- atraumatic            Eyes- Gross vision intact, PERRLA, conjunctivae clear secretions            Ears- Hearing, canals normal for age            Nose- Clear, no-Septal dev, mucus, polyps, erosion, perforation             Throat- Mallampati II , mucosa clear , drainage- none, tonsils- atrophic Neck- flexible , trachea midline, no stridor , thyroid nl, carotid no  bruit Chest - symmetrical excursion , unlabored           Heart/CV- irreg , no murmur , no gallop  , no rub, nl s1 s2                           - JVD+trace , edema+1-2, stasis changes-  none, varices- none           Lung- +few basilar crackles, cough- none , dullness-none, rub- none           Chest wall-  Abd-  Br/ Gen/ Rectal- Not done, not indicated Extrem- cyanosis- none, clubbing, none, atrophy- none, strength- nl Neuro- grossly intact to observation

## 2014-11-01 NOTE — Patient Instructions (Signed)
Take Tessalon Perles Three times a day  As needed  Cough.  Use Xopenex Neb every 4 hrs as needed for wheezing.  Taper Prednisone to 2.5mg  daily  Follow up Dr. Maple HudsonYoung  In 6 weeks and As needed

## 2014-11-07 ENCOUNTER — Encounter: Payer: BC Managed Care – PPO | Admitting: Adult Health

## 2014-11-07 ENCOUNTER — Encounter: Payer: Self-pay | Admitting: Adult Health

## 2014-11-07 NOTE — Progress Notes (Signed)
    ERROR. No show 

## 2014-11-14 ENCOUNTER — Encounter: Payer: Self-pay | Admitting: *Deleted

## 2014-11-14 NOTE — Progress Notes (Signed)
CARDIOLOGY CONSULT NOTE   Patient ID: Carrie Heath MRN: [id number] DOB/AGE: [birth year] 81 y.o.  Admit Date: 11/15/2014 Referring Physician: PTH Primary Physician: Benita Stabile Consulting Cardiologist:Branch, Christiane Ha MD Primary Cardiologis: Prentice Docker MD Reason for Consultation: Diastolic CHF  Clinical Summary Ms. Winkles is a 81 y.o.female patient of Dr. Purvis Sheffield, that we follow for ongoing assessment and management of permanent atrial fibrillation, with dabigatran for anticoagulation, with history of diabetes, GERD, and chronic dyspnea.  Unfortunately, she was admitted to [facility] hospital in the setting of atrial fib with RVR, and dyspnea.  She was found to have pulmonary edema, she was treated with diltiazem drip for rate control.  It is found that her elevated heart rate was related to COPD exacerbation, and bronchitis.  She was discharged home on 79 L nasal cannula.  She was treated with antibiotics and steroids.  Echocardiogram was completed during hospitalization, revealing an EF of 65-70%.  She is here for post hospitalization followup.   She comes to the office today with 13 lb weight gain, (since 11/01/2014), with significant LEE, to the knees and underside of thighs bilaterally, anasarca pattern, evidence of cellulitis in the right LE pretibial area, with some mild redness in the left lower extremity. She admits to eating some salty foods while at [facility]. She states the edema, has worsened since coming home from hospital. She denies chest pain, worsening dyspnea, or weakness. She denies NV, worsening dyspnea, racing HR or chest pain.  No Known Allergies  Medications Scheduled Medications:    Infusions:    PRN Medications:      Past Medical History  Diagnosis Date  . DM type 2 (diabetes mellitus, type 2)   . Bronchitis   . COPD (chronic obstructive pulmonary disease)   . Hyperlipidemia   . HTN (hypertension)   . Neuropathy   . Chronic a-fib   .  Diabetes mellitus with neuropathy     Past Surgical History  Procedure Laterality Date  . Thyroidectomy, partial      remote  . Tonsillectomy    . Appendectomy    . Cholecystectomy    . Total abdominal hysterectomy    . Bladder surgery      Family History  Problem Relation Age of Onset  . Heart disease Father   . Heart disease Brother     CABG  . Alzheimer's disease Mother     Social History Ms. Menn reports that she quit smoking about 8 years ago. Her smoking use included Cigarettes. She started smoking about 70 years ago. She smoked 0.50 packs per day. She has quit using smokeless tobacco. Ms. Lukin reports that she does not drink alcohol.  Review of Systems Complete review of systems are found to be negative unless outlined in H&P above.  Physical Examination Blood pressure 114/68, pulse 51, height 5' (1.524 m), weight 148 lb 12.8 oz (67.495 kg), SpO2 95 %.  Telemetry: Pending  GEN:No acute distress, but complaining of abdominal distention, and some discomfort in her LEE from edema. HEENT: Conjunctiva and lids normal, oropharynx clear with moist mucosa. Neck: Supple, no elevated JVP or carotid bruits, no thyromegaly. Lungs: Clear to auscultation, nonlabored breathing at rest. Cardiac: Iregular rate and rhythm, no S3 or significant systolic murmur, no pericardial rub. Abdomen: Soft, distended, no hepatomegaly, bowel sounds present, no guarding or rebound. Extremities: 2+ pitting edema, distal pulses difficult to palpate due to edema. Redness on the pre-tibial on the right in what appears to be cellulitis  pattern with some reddened areas on the lower left.She has some dependent edema in the lower thighs as well.. Skin: Warm and dry. Musculoskeletal: No kyphosis. Neuropsychiatric: Alert and oriented x3, affect grossly appropriate.  Prior Cardiac Testing/Procedures 1.Echocardiogram Left ventricle: The cavity size was normal. Wall thickness was increased in a  pattern of moderate LVH. Systolic function was vigorous. The estimated ejection fraction was in the range of 65% to 70%. Wall motion was normal; there were no regional wall motion abnormalities. The study was not technically sufficient to allow evaluation of LV diastolic dysfunction due to atrial fibrillation. - Aortic valve: Mildly calcified annulus. Trileaflet; mildly thickened leaflets. There was mild regurgitation. Valve area (VTI): 2.05 cm^2. Valve area (Vmax): 1.93 cm^2. - Mitral valve: Moderately calcified annulus. Mildly thickened leaflets . There was mild regurgitation. - Left atrium: The atrium was severely dilated. - Right ventricle: The cavity size was moderately to severely dilated. Systolic function was low normal. RV TAPSE is 1.6 cm. - Right atrium: The atrium was severely dilated. - Atrial septum: There was a patent foramen ovale with evidence of left to right shunting. - Tricuspid valve: There was moderate regurgitation. - Pulmonary arteries: Systolic pressure was moderately increased. PA peak pressure: 39 mm Hg (S). - Technically adequate study.   Lab Results  Basic Metabolic Panel: Pending Liver Function Tests: Pending CBC: Pending Cardiac Enzymes: Pending BNP: Pending  Radiology: Pending.  WJX:BJYNWG fib rate of 75 bpm.   Impression and Recommendations  1.Acute on Chronic Diastolic CHF: She has gained 20 lbs since discharge on 10/20/2014, discharge, 13 lbs since 11/01/2014. She has significant LEE with what appears to be cellulitis of the RLE. She admits to eating salty foods and drinking a lot at [facility]. She will need to be admitted for IV diureses, medication adjustment at discharge and will also need nutrition consultation to reinforce low sodium diet. I have spoken to her son, via speaker concerning this. Dr. Wyline Mood has also seen the patient and is in agreement with plan for admission.    Will begin IV lasix 40 mg BID.  Place f/c and completed labs. Low sodium diet.  Will not plan to repeat echo as she just had one approx 3 weeks ago.  2. Cellulitis: May need IV antibiotics for this as she has significant redness of the RLE on the anterior pre-tibial area. Will defer to PTH  3.Atrial fib: Heart rate is controlled Continue diltiazem CD, 120 mg tablets daily with Pradaxa 75 mg BID. Monitor on telemetry for HR control.   4. COPD: Xopenex 1.25 mg BID for ongoing treatment.  5. Diabetes: She is on metformin 500 mg BID with meals. Will likely need FSBS throughout admission to evaluate BG control. She has been on prednisone recently, due to COPD exacerbation. Defer to PTH.   Signed: Bettey Mare. Isami Mehra NP [facility]  11/15/2014, 3:35 PM Co-Sign MD

## 2014-11-15 ENCOUNTER — Inpatient Hospital Stay (HOSPITAL_COMMUNITY): Payer: Medicare Other

## 2014-11-15 ENCOUNTER — Ambulatory Visit (INDEPENDENT_AMBULATORY_CARE_PROVIDER_SITE_OTHER): Payer: Medicare Other | Admitting: Adult Health

## 2014-11-15 ENCOUNTER — Encounter: Payer: Self-pay | Admitting: Adult Health

## 2014-11-15 ENCOUNTER — Encounter (HOSPITAL_COMMUNITY): Payer: Self-pay | Admitting: *Deleted

## 2014-11-15 ENCOUNTER — Inpatient Hospital Stay (HOSPITAL_COMMUNITY)
Admission: AD | Admit: 2014-11-15 | Discharge: 2014-11-18 | DRG: 292 | Disposition: A | Discharge status: Skilled Nursing Facility | Payer: Medicare Other | Source: Ambulatory Visit | Attending: Family Medicine | Admitting: Family Medicine

## 2014-11-15 VITALS — BP 114/68 | HR 51 | Ht 60.0 in | Wt 148.8 lb

## 2014-11-15 DIAGNOSIS — Z87891 Personal history of nicotine dependence: Secondary | ICD-10-CM

## 2014-11-15 DIAGNOSIS — Z9981 Dependence on supplemental oxygen: Secondary | ICD-10-CM

## 2014-11-15 DIAGNOSIS — K219 Gastro-esophageal reflux disease without esophagitis: Secondary | ICD-10-CM | POA: Diagnosis present

## 2014-11-15 DIAGNOSIS — Z7901 Long term (current) use of anticoagulants: Secondary | ICD-10-CM

## 2014-11-15 DIAGNOSIS — E663 Overweight: Secondary | ICD-10-CM | POA: Diagnosis present

## 2014-11-15 DIAGNOSIS — I272 Other secondary pulmonary hypertension: Secondary | ICD-10-CM | POA: Diagnosis present

## 2014-11-15 DIAGNOSIS — I959 Hypotension, unspecified: Secondary | ICD-10-CM | POA: Diagnosis present

## 2014-11-15 DIAGNOSIS — I5031 Acute diastolic (congestive) heart failure: Secondary | ICD-10-CM

## 2014-11-15 DIAGNOSIS — Z794 Long term (current) use of insulin: Secondary | ICD-10-CM

## 2014-11-15 DIAGNOSIS — I4891 Unspecified atrial fibrillation: Secondary | ICD-10-CM | POA: Diagnosis present

## 2014-11-15 DIAGNOSIS — E780 Pure hypercholesterolemia: Secondary | ICD-10-CM | POA: Diagnosis present

## 2014-11-15 DIAGNOSIS — I5032 Chronic diastolic (congestive) heart failure: Secondary | ICD-10-CM | POA: Diagnosis present

## 2014-11-15 DIAGNOSIS — J441 Chronic obstructive pulmonary disease with (acute) exacerbation: Secondary | ICD-10-CM | POA: Diagnosis present

## 2014-11-15 DIAGNOSIS — I5033 Acute on chronic diastolic (congestive) heart failure: Principal | ICD-10-CM | POA: Diagnosis present

## 2014-11-15 DIAGNOSIS — I482 Chronic atrial fibrillation: Secondary | ICD-10-CM | POA: Diagnosis present

## 2014-11-15 DIAGNOSIS — M5030 Other cervical disc degeneration, unspecified cervical region: Secondary | ICD-10-CM | POA: Diagnosis present

## 2014-11-15 DIAGNOSIS — G629 Polyneuropathy, unspecified: Secondary | ICD-10-CM

## 2014-11-15 DIAGNOSIS — I10 Essential (primary) hypertension: Secondary | ICD-10-CM | POA: Diagnosis present

## 2014-11-15 DIAGNOSIS — Z90710 Acquired absence of both cervix and uterus: Secondary | ICD-10-CM | POA: Diagnosis not present

## 2014-11-15 DIAGNOSIS — Z6828 Body mass index (BMI) 28.0-28.9, adult: Secondary | ICD-10-CM | POA: Diagnosis not present

## 2014-11-15 DIAGNOSIS — E785 Hyperlipidemia, unspecified: Secondary | ICD-10-CM | POA: Diagnosis present

## 2014-11-15 DIAGNOSIS — E1142 Type 2 diabetes mellitus with diabetic polyneuropathy: Secondary | ICD-10-CM | POA: Diagnosis present

## 2014-11-15 DIAGNOSIS — Z66 Do not resuscitate: Secondary | ICD-10-CM | POA: Diagnosis present

## 2014-11-15 DIAGNOSIS — Z9049 Acquired absence of other specified parts of digestive tract: Secondary | ICD-10-CM | POA: Diagnosis present

## 2014-11-15 DIAGNOSIS — R609 Edema, unspecified: Secondary | ICD-10-CM | POA: Diagnosis present

## 2014-11-15 DIAGNOSIS — L03115 Cellulitis of right lower limb: Secondary | ICD-10-CM | POA: Diagnosis present

## 2014-11-15 HISTORY — DX: Pure hypercholesterolemia, unspecified: E78.00

## 2014-11-15 LAB — COMPREHENSIVE METABOLIC PANEL
ALBUMIN: 3.9 g/dL (ref 3.5–5.2)
ALT: 19 U/L (ref 0–35)
ANION GAP: 9 (ref 5–15)
AST: 21 U/L (ref 0–37)
Alkaline Phosphatase: 86 U/L (ref 39–117)
BUN: 13 mg/dL (ref 6–23)
CALCIUM: 9.4 mg/dL (ref 8.4–10.5)
CO2: 33 mmol/L — AB (ref 19–32)
CREATININE: 1.15 mg/dL — AB (ref 0.50–1.10)
Chloride: 98 mEq/L (ref 96–112)
GFR calc non Af Amer: 40 mL/min — ABNORMAL LOW (ref >90)
GFR, EST AFRICAN AMERICAN: 47 mL/min — AB (ref >90)
Glucose, Bld: 114 mg/dL — ABNORMAL HIGH (ref 70–99)
Potassium: 3.6 mmol/L (ref 3.5–5.1)
Sodium: 140 mmol/L (ref 135–145)
TOTAL PROTEIN: 7.1 g/dL (ref 6.0–8.3)
Total Bilirubin: 0.5 mg/dL (ref 0.3–1.2)

## 2014-11-15 LAB — CBC WITH DIFFERENTIAL/PLATELET
BASOS ABS: 0 10*3/uL (ref 0.0–0.1)
BASOS PCT: 0 % (ref 0–1)
EOS ABS: 0.2 10*3/uL (ref 0.0–0.7)
EOS PCT: 2 % (ref 0–5)
HEMATOCRIT: 35.5 % — AB (ref 36.0–46.0)
Hemoglobin: 11.5 g/dL — ABNORMAL LOW (ref 12.0–15.0)
Lymphocytes Relative: 17 % (ref 12–46)
Lymphs Abs: 1.3 10*3/uL (ref 0.7–4.0)
MCH: 30.8 pg (ref 26.0–34.0)
MCHC: 32.4 g/dL (ref 30.0–36.0)
MCV: 95.2 fL (ref 78.0–100.0)
MONO ABS: 0.6 10*3/uL (ref 0.1–1.0)
Monocytes Relative: 8 % (ref 3–12)
Neutro Abs: 5.5 10*3/uL (ref 1.7–7.7)
Neutrophils Relative %: 73 % (ref 43–77)
PLATELETS: 229 10*3/uL (ref 150–400)
RBC: 3.73 MIL/uL — ABNORMAL LOW (ref 3.87–5.11)
RDW: 13.9 % (ref 11.5–15.5)
WBC: 7.6 10*3/uL (ref 4.0–10.5)

## 2014-11-15 LAB — GLUCOSE, CAPILLARY
GLUCOSE-CAPILLARY: 113 mg/dL — AB (ref 70–99)
Glucose-Capillary: 120 mg/dL — ABNORMAL HIGH (ref 70–99)

## 2014-11-15 MED ORDER — INSULIN ASPART 100 UNIT/ML ~~LOC~~ SOLN
0.0000 [IU] | Freq: Three times a day (TID) | SUBCUTANEOUS | Status: DC
  Administered 2014-11-17: 2 [IU] via SUBCUTANEOUS
  Administered 2014-11-18: 5 [IU] via SUBCUTANEOUS

## 2014-11-15 MED ORDER — PRAVASTATIN SODIUM 40 MG PO TABS
40.0000 mg | ORAL_TABLET | Freq: Every day | ORAL | Status: DC
  Administered 2014-11-15 – 2014-11-17 (×3): 40 mg via ORAL
  Filled 2014-11-15 (×3): qty 1

## 2014-11-15 MED ORDER — PANTOPRAZOLE SODIUM 40 MG PO TBEC
40.0000 mg | DELAYED_RELEASE_TABLET | Freq: Every day | ORAL | Status: DC
  Administered 2014-11-15 – 2014-11-18 (×4): 40 mg via ORAL
  Filled 2014-11-15 (×4): qty 1

## 2014-11-15 MED ORDER — TRAMADOL HCL 50 MG PO TABS
25.0000 mg | ORAL_TABLET | Freq: Two times a day (BID) | ORAL | Status: DC | PRN
  Administered 2014-11-17 – 2014-11-18 (×2): 25 mg via ORAL
  Filled 2014-11-15 (×2): qty 1

## 2014-11-15 MED ORDER — POTASSIUM CHLORIDE CRYS ER 20 MEQ PO TBCR
20.0000 meq | EXTENDED_RELEASE_TABLET | Freq: Two times a day (BID) | ORAL | Status: DC
  Administered 2014-11-15 – 2014-11-18 (×6): 20 meq via ORAL
  Filled 2014-11-15 (×6): qty 1

## 2014-11-15 MED ORDER — FUROSEMIDE 10 MG/ML IJ SOLN
40.0000 mg | Freq: Two times a day (BID) | INTRAMUSCULAR | Status: DC
  Administered 2014-11-15 – 2014-11-17 (×4): 40 mg via INTRAVENOUS
  Filled 2014-11-15 (×4): qty 4

## 2014-11-15 MED ORDER — MUPIROCIN 2 % EX OINT
1.0000 "application " | TOPICAL_OINTMENT | Freq: Two times a day (BID) | CUTANEOUS | Status: DC
  Administered 2014-11-15 – 2014-11-18 (×6): 1 via NASAL
  Filled 2014-11-15: qty 22

## 2014-11-15 MED ORDER — PREDNISONE 5 MG PO TABS
2.5000 mg | ORAL_TABLET | Freq: Every day | ORAL | Status: DC
  Administered 2014-11-16 – 2014-11-18 (×3): 2.5 mg via ORAL
  Filled 2014-11-15 (×3): qty 1

## 2014-11-15 MED ORDER — SODIUM CHLORIDE 0.9 % IJ SOLN
3.0000 mL | Freq: Two times a day (BID) | INTRAMUSCULAR | Status: DC
  Administered 2014-11-15 – 2014-11-18 (×6): 3 mL via INTRAVENOUS

## 2014-11-15 MED ORDER — DABIGATRAN ETEXILATE MESYLATE 75 MG PO CAPS
75.0000 mg | ORAL_CAPSULE | Freq: Two times a day (BID) | ORAL | Status: DC
  Administered 2014-11-15 – 2014-11-18 (×6): 75 mg via ORAL
  Filled 2014-11-15 (×12): qty 1

## 2014-11-15 MED ORDER — DILTIAZEM HCL ER COATED BEADS 120 MG PO CP24: 120.0000 mg | ORAL_CAPSULE | Freq: Every day | ORAL | Status: DC

## 2014-11-15 MED ORDER — CHLORHEXIDINE GLUCONATE CLOTH 2 % EX PADS
6.0000 | MEDICATED_PAD | Freq: Every day | CUTANEOUS | Status: DC
  Administered 2014-11-16 – 2014-11-18 (×3): 6 via TOPICAL

## 2014-11-15 MED ORDER — METFORMIN HCL 500 MG PO TABS
500.0000 mg | ORAL_TABLET | Freq: Two times a day (BID) | ORAL | Status: DC
  Administered 2014-11-15 – 2014-11-18 (×6): 500 mg via ORAL
  Filled 2014-11-15 (×6): qty 1

## 2014-11-15 MED ORDER — GABAPENTIN 300 MG PO CAPS
600.0000 mg | ORAL_CAPSULE | Freq: Four times a day (QID) | ORAL | Status: DC
  Administered 2014-11-15 – 2014-11-18 (×12): 600 mg via ORAL
  Filled 2014-11-15 (×12): qty 2

## 2014-11-15 MED ORDER — INSULIN ASPART 100 UNIT/ML ~~LOC~~ SOLN: 0.0000 [IU] | Freq: Every day | SUBCUTANEOUS | Status: DC

## 2014-11-15 NOTE — Progress Notes (Deleted)
Name: Carrie Heath    DOB: 1921-03-12  Age: 81 y.o.  MR#: [id number]       PCP:  Benita Stabile      Insurance: Payor: MEDICARE / Plan: MEDICARE PART A AND B / Product Type: *No Product type* /   CC:    Chief Complaint  Patient presents with  . Atrial Fibrillation    VS Filed Vitals:   11/15/14 1442  BP: 114/68  Pulse: 51  Height: 5' (1.524 m)  Weight: 148 lb 12.8 oz (67.495 kg)  SpO2: 95%    Weights Current Weight  11/15/14 148 lb 12.8 oz (67.495 kg)  11/01/14 135 lb 12.8 oz (61.598 kg)  10/20/14 128 lb (58.06 kg)    Blood Pressure  BP Readings from Last 3 Encounters:  11/15/14 114/68  11/01/14 110/72  10/20/14 114/81     Admit date:  (Not on file) Last encounter with RMR:  Visit date not found   Allergy Review of patient's allergies indicates no known allergies.  Current Outpatient Prescriptions  Medication Sig Dispense Refill  . albuterol (PROAIR HFA) 108 (90 BASE) MCG/ACT inhaler Inhale 2 puffs into the lungs every 4 (four) hours as needed for wheezing or shortness of breath. 8.5 g 11  . benzonatate (TESSALON PERLES) 100 MG capsule Take 1 capsule (100 mg total) by mouth 3 (three) times daily as needed for cough. 30 capsule 1  . CALCIUM CITRATE PO Take 2 tablets by mouth daily.    . dabigatran (PRADAXA) 75 MG CAPS Take 1 capsule (75 mg total) by mouth every 12 (twelve) hours. 180 capsule 1  . diltiazem (CARDIZEM CD) 120 MG 24 hr capsule Take 1 capsule (120 mg total) by mouth daily. 30 capsule 3  . famotidine (PEPCID) 20 MG tablet Take 1 tablet (20 mg total) by mouth daily. 30 tablet 3  . furosemide (LASIX) 40 MG tablet Take 40 mg by mouth daily.    Marland Kitchen gabapentin (NEURONTIN) 600 MG tablet Take 600 mg by mouth 4 (four) times daily.    . Glucosamine-Chondroit-Vit C-Mn (GLUCOSAMINE CHONDROITIN COMPLX) CAPS Take 3 capsules by mouth 2 (two) times daily.     Marland Kitchen guaiFENesin (MUCINEX) 600 MG 12 hr tablet Take 600 mg by mouth 2 (two) times daily.    Marland Kitchen ibuprofen (ADVIL,MOTRIN)  200 MG tablet Take 400 mg by mouth 3 (three) times daily as needed (pain).    Marland Kitchen levalbuterol (XOPENEX) 0.63 MG/3ML nebulizer solution Take 3 mLs (0.63 mg total) by nebulization every 4 (four) hours as needed for wheezing or shortness of breath. 300 mL 3  . metFORMIN (GLUCOPHAGE) 500 MG tablet Take 500 mg by mouth 2 (two) times daily with a meal.      . Multiple Vitamins-Minerals (ABC PLUS SENIOR) TABS Take 1 tablet by mouth daily.    . Omega-3 Fatty Acids (FISH OIL) 1000 MG CAPS Take 1 capsule by mouth daily.     Marland Kitchen omeprazole (PRILOSEC) 20 MG capsule Take 1 capsule by mouth daily.    . potassium chloride SA (K-DUR,KLOR-CON) 20 MEQ tablet Take 20 mEq by mouth daily.    . pravastatin (PRAVACHOL) 20 MG tablet Take 20 mg by mouth daily.    . predniSONE (DELTASONE) 10 MG tablet Starting tomorrow, take 5 tablets daily for 1 day; then 4 tablets daily for 1 day; then 3 tablets daily for 1 day; then 2 tablets daily for 1 day; then 1 tablet daily for 1 day; then stop. (Then restart normal home dose  of 2.5 mg daily.) 15 tablet 0  . predniSONE (DELTASONE) 2.5 MG tablet RESTART THIS MEDICATION AT 1 TAB DAILY IN 5 DAYS, AFTER PREDNISONE TAPER IS COMPLETED.    Marland Kitchen. ranitidine (ZANTAC) 150 MG tablet Take 150 mg by mouth daily.    Marland Kitchen. Respiratory Therapy Supplies (FLUTTER) DEVI Blow through 4 times, and do this 3 times daily when needed 1 each 0  . traMADol (ULTRAM) 50 MG tablet Take 25 mg by mouth 2 (two) times daily as needed (pain).     No current facility-administered medications for this visit.    Discontinued Meds:    Medications Discontinued During This Encounter  Medication Reason  . azithromycin (ZITHROMAX) 250 MG tablet Error    Patient Active Problem List   Diagnosis Date Noted  . Atrial fibrillation with RVR 10/15/2014  . Acute on chronic diastolic CHF (congestive heart failure) 10/15/2014  . Acute bronchitis with chronic obstructive pulmonary disease (COPD) 10/15/2014  . SOB (shortness of breath)  10/15/2014  . Atrial fibrillation with controlled ventricular response 02/17/2012  . Hip pain, bilateral 04/25/2011  . Spinal stenosis 04/25/2011  . Weakness of both legs 04/25/2011  . COPD exacerbation 08/18/2009  . KNEE, ARTHRITIS, DEGEN./OSTEO 05/01/2009  . KNEE PAIN 05/01/2009  . LUNG NODULE 02/15/2009  . DM type 2 with diabetic peripheral neuropathy 01/13/2009  . HYPERLIPIDEMIA 01/13/2009  . Asthma with bronchitis 01/13/2009    LABS    Component Value Date/Time   NA 137 10/20/2014 0550   NA 137 10/19/2014 0559   NA 135* 10/18/2014 0424   K 3.8 10/20/2014 0550   K 4.0 10/19/2014 0559   K 3.1* 10/18/2014 0424   CL 93* 10/20/2014 0550   CL 94* 10/19/2014 0559   CL 89* 10/18/2014 0424   CO2 31 10/20/2014 0550   CO2 33* 10/19/2014 0559   CO2 35* 10/18/2014 0424   GLUCOSE 113* 10/20/2014 0550   GLUCOSE 127* 10/19/2014 0559   GLUCOSE 128* 10/18/2014 0424   BUN 17 10/20/2014 0550   BUN 15 10/19/2014 0559   BUN 19 10/18/2014 0424   CREATININE 0.83 10/20/2014 0550   CREATININE 0.82 10/19/2014 0559   CREATININE 0.81 10/18/2014 0424   CALCIUM 8.9 10/20/2014 0550   CALCIUM 8.8 10/19/2014 0559   CALCIUM 8.7 10/18/2014 0424   GFRNONAA 60* 10/20/2014 0550   GFRNONAA 61* 10/19/2014 0559   GFRNONAA 62* 10/18/2014 0424   GFRAA 69* 10/20/2014 0550   GFRAA 70* 10/19/2014 0559   GFRAA 71* 10/18/2014 0424   CMP     Component Value Date/Time   NA 137 10/20/2014 0550   K 3.8 10/20/2014 0550   CL 93* 10/20/2014 0550   CO2 31 10/20/2014 0550   GLUCOSE 113* 10/20/2014 0550   BUN 17 10/20/2014 0550   CREATININE 0.83 10/20/2014 0550   CALCIUM 8.9 10/20/2014 0550   PROT 7.6 10/15/2014 2217   ALBUMIN 3.9 10/15/2014 2217   AST 21 10/15/2014 2217   ALT 13 10/15/2014 2217   ALKPHOS 91 10/15/2014 2217   BILITOT 0.3 10/15/2014 2217   GFRNONAA 60* 10/20/2014 0550   GFRAA 69* 10/20/2014 0550       Component Value Date/Time   WBC 11.3* 10/20/2014 0550   WBC 10.8* 10/19/2014 0559    WBC 12.2* 10/15/2014 2217   HGB 11.1* 10/20/2014 0550   HGB 11.5* 10/19/2014 0559   HGB 13.0 10/15/2014 2217   HCT 33.3* 10/20/2014 0550   HCT 35.0* 10/19/2014 0559   HCT 39.2 10/15/2014 2217  MCV 93.8 10/20/2014 0550   MCV 95.1 10/19/2014 0559   MCV 94.7 10/15/2014 2217    Lipid Panel  No results found for: CHOL, TRIG, HDL, CHOLHDL, VLDL, LDLCALC, LDLDIRECT  ABG No results found for: PHART, PCO2ART, PO2ART, HCO3, TCO2, ACIDBASEDEF, O2SAT   Lab Results  Component Value Date   TSH 1.580 10/16/2014   BNP (last 3 results)  Recent Labs  04/28/14 0155 10/15/14 2217  PROBNP 1678.0* 2147.0*   Cardiac Panel (last 3 results) No results for input(s): CKTOTAL, CKMB, TROPONINI, RELINDX in the last 72 hours.  Iron/TIBC/Ferritin/ %Sat No results found for: IRON, TIBC, FERRITIN, IRONPCTSAT   EKG Orders placed or performed during the hospital encounter of 10/15/14  . EKG 12-Lead  . EKG 12-Lead  . EKG     Prior Assessment and Plan Problem List as of 11/15/2014    DM type 2 with diabetic peripheral neuropathy   HYPERLIPIDEMIA   Asthma with bronchitis   Last Assessment & Plan   04/25/2014 Office Visit Written 06/26/2014  7:35 PM by Waymon Budge, MD    Well controlled, without complication at this time, but few crackles and ankle edema indicate fluid overload possibly from heart Plan-chest x-ray    COPD exacerbation   Last Assessment & Plan   03/14/2014 Office Visit Written 03/14/2014  9:23 PM by Waymon Budge, MD    Severe COPD, advancing age and increasing difficulty managing her own affairs. I have tried to reconcile medications between our list and the Assisted Living facility list. She should only use an extended action nebulizer solution twice or at most 3 times a day. We have her listed with perforomist, although the son refers to brovana. Rescue inhaler up to 4 times daily if needed. Okay to continue low-dose prednisone for stability and probably some adrenal support.     LUNG NODULE   Last Assessment & Plan   09/13/2013 Office Visit Edited 09/27/2013  8:30 PM by Waymon Budge, MD    Plan- update CXR- discussed    KNEE, ARTHRITIS, DEGEN./OSTEO   KNEE PAIN   Hip pain, bilateral   Spinal stenosis   Weakness of both legs   Atrial fibrillation with controlled ventricular response   Last Assessment & Plan   04/25/2014 Office Visit Written 06/26/2014  7:36 PM by Waymon Budge, MD    Managed by cardiology. Watch for fluid overload    Atrial fibrillation with RVR   Acute on chronic diastolic CHF (congestive heart failure)   Acute bronchitis with chronic obstructive pulmonary disease (COPD)   SOB (shortness of breath)       Imaging: Dg Chest 2 View  10/20/2014   EXAM: CHEST  2 VIEW  COMPARISON:  None.  FINDINGS: The heart size and mediastinal contours are within normal limits. Both lungs are clear. The visualized skeletal structures are unremarkable.  IMPRESSION: No active cardiopulmonary disease.   Electronically Signed   By: Alcide Clever M.D.   On: 10/20/2014 14:54   Dg Chest 2 View  10/16/2014   CLINICAL DATA:  Cough.  Shortness of breath.  EXAM: CHEST  2 VIEW  COMPARISON:  10/15/2014  FINDINGS: Mildly tortuous thoracic aorta. Subsegmental atelectasis at both lung bases. Mild enlargement of the cardiopericardial silhouette. No edema.  Thoracic spondylosis is present.  The pulmonary nodules shown on prior chest CTs are not well seen on today's chest radiograph.  IMPRESSION: 1. This patient had multiple pulmonary nodules measuring up to 10 mm average size, shown to  be stable over a 9 month period in 2010, but if there were followed up afterwards within the [facility] system. If there was no CT followup elsewhere after the 08/29/2009 exam, then I would recommend a chest CT just to be careful and ensure that there is no progressive growth to indicate a low-grade malignancy. 2. Mild enlargement of the cardiopericardial silhouette, without edema. 3. Tortuous  thoracic aorta. 4. Bibasilar subsegmental atelectasis, without overt consolidation identified. 5. Thoracic spondylosis.   Electronically Signed   By: Herbie Baltimore M.D.   On: 10/16/2014 18:07

## 2014-11-15 NOTE — Progress Notes (Signed)
Patient is a 81 year old female with past history diabetes, diastolic CHF and COPD who was discharged a month ago to skilled nursing and presented today to cardiologist's office for follow-up. Patient was noted to have an erythematous right pretibial area concerning for cellulitis and she was also noted to be 20 pounds overweight, in acute congestive heart failure. Cardiology notified hospitalists and patient admitted directly from office.

## 2014-11-15 NOTE — H&P (Signed)
[facility] History and Physical  CLARE FENNIMORE LOV:[id number] DOB: Oct 01, 1921 DOA: 11/15/2014  Referring physician: Cardiology clinic, Dr. Wyline Mood PCP: Benita Stabile   Chief Complaint: Weight gain, leg edema.  HPI: Carrie Heath is a 81 y.o. female  This is a 81 year old lady who has a previous history of diastolic congestive heart failure and permanent atrial fibrillation on chronic anti-coagulation therapy who now presents with increasing leg edema and swelling associated with a 13 pound weight gain since last documented approximately 2 weeks ago. She denies worsening dyspnea, chest pain or palpitations. She denies fever, nausea or vomiting. It was felt that she was in acute on chronic congestive heart failure and she is now being admitted for further management of this. She does have some evidence of redness in her right lower leg and left lower leg in the pretibial area but without fever.   Review of Systems:  Apart from symptoms mentioned above, all other systems are negative.  Past Medical History  Diagnosis Date  . DM type 2 (diabetes mellitus, type 2)   . Bronchitis   . COPD (chronic obstructive pulmonary disease)   . Hyperlipidemia   . HTN (hypertension)   . Neuropathy   . Chronic a-fib   . Diabetes mellitus with neuropathy   . High cholesterol    Past Surgical History  Procedure Laterality Date  . Thyroidectomy, partial      remote  . Tonsillectomy    . Appendectomy    . Cholecystectomy    . Total abdominal hysterectomy    . Bladder surgery     Social History:  reports that she quit smoking about 8 years ago. Her smoking use included Cigarettes. She started smoking about 70 years ago. She smoked 0.50 packs per day. She has quit using smokeless tobacco. She reports that she does not drink alcohol or use illicit drugs.  No Known Allergies  Family History  Problem Relation Age of Onset  . Heart disease Father   . Heart disease Brother     CABG  .  Alzheimer's disease Mother      Prior to Admission medications   Medication Sig Start Date End Date Taking? Authorizing Provider  albuterol (PROAIR HFA) 108 (90 BASE) MCG/ACT inhaler Inhale 2 puffs into the lungs every 4 (four) hours as needed for wheezing or shortness of breath. 10/15/13 12/02/14 Yes Clinton D Young, MD  benzonatate (TESSALON PERLES) 100 MG capsule Take 1 capsule (100 mg total) by mouth 3 (three) times daily as needed for cough. 11/01/14  Yes Tammy S Parrett, NP  CALCIUM CITRATE PO Take 2 tablets by mouth daily.   Yes Historical Provider, MD  dabigatran (PRADAXA) 75 MG CAPS Take 1 capsule (75 mg total) by mouth every 12 (twelve) hours. 12/02/12  Yes Gaylord Shih, MD  diltiazem (CARDIZEM CD) 120 MG 24 hr capsule Take 1 capsule (120 mg total) by mouth daily. 10/20/14  Yes Elliot Cousin, MD  famotidine (PEPCID) 20 MG tablet Take 1 tablet (20 mg total) by mouth daily. 10/20/14  Yes Elliot Cousin, MD  furosemide (LASIX) 40 MG tablet Take 40 mg by mouth daily.   Yes Historical Provider, MD  gabapentin (NEURONTIN) 600 MG tablet Take 600 mg by mouth 4 (four) times daily.   Yes Historical Provider, MD  Glucosamine-Chondroit-Vit C-Mn (GLUCOSAMINE CHONDROITIN COMPLX) CAPS Take 3 capsules by mouth 2 (two) times daily.    Yes Historical Provider, MD  guaiFENesin (MUCINEX) 600 MG 12 hr tablet Take 600 mg  by mouth 2 (two) times daily.   Yes Historical Provider, MD  levalbuterol (XOPENEX) 0.63 MG/3ML nebulizer solution Take 3 mLs (0.63 mg total) by nebulization every 4 (four) hours as needed for wheezing or shortness of breath. 11/01/14  Yes Tammy S Parrett, NP  metFORMIN (GLUCOPHAGE) 500 MG tablet Take 500 mg by mouth 2 (two) times daily with a meal.     Yes Historical Provider, MD  Multiple Vitamins-Minerals (ABC PLUS SENIOR) TABS Take 1 tablet by mouth daily.   Yes Historical Provider, MD  Omega-3 Fatty Acids (FISH OIL) 1000 MG CAPS Take 1 capsule by mouth daily.    Yes Historical Provider, MD    omeprazole (PRILOSEC) 20 MG capsule Take 1 capsule by mouth daily. 09/10/13  Yes Historical Provider, MD  potassium chloride SA (K-DUR,KLOR-CON) 20 MEQ tablet Take 20 mEq by mouth daily.   Yes Historical Provider, MD  pravastatin (PRAVACHOL) 20 MG tablet Take 20 mg by mouth daily. 07/25/14  Yes Historical Provider, MD  predniSONE (DELTASONE) 2.5 MG tablet RESTART THIS MEDICATION AT 1 TAB DAILY IN 5 DAYS, AFTER PREDNISONE TAPER IS COMPLETED. 10/20/14  Yes Elliot Cousinenise Fisher, MD  ranitidine (ZANTAC) 150 MG tablet Take 150 mg by mouth daily. 08/24/14  Yes Historical Provider, MD  ibuprofen (ADVIL,MOTRIN) 200 MG tablet Take 400 mg by mouth 3 (three) times daily as needed (pain).    Historical Provider, MD  predniSONE (DELTASONE) 10 MG tablet Starting tomorrow, take 5 tablets daily for 1 day; then 4 tablets daily for 1 day; then 3 tablets daily for 1 day; then 2 tablets daily for 1 day; then 1 tablet daily for 1 day; then stop. (Then restart normal home dose of 2.5 mg daily.) 10/20/14   Elliot Cousinenise Fisher, MD  Respiratory Therapy Supplies (FLUTTER) DEVI Blow through 4 times, and do this 3 times daily when needed 01/27/12   Waymon Budgelinton D Young, MD  traMADol (ULTRAM) 50 MG tablet Take 25 mg by mouth 2 (two) times daily as needed (pain).    Historical Provider, MD   Physical Exam: Filed Vitals:   11/15/14 1713  BP: 128/74  Pulse: 54  Temp: 98 F (36.7 C)  TempSrc: Oral  Resp: 20  Height: 5' (1.524 m)  Weight: 68.493 kg (151 lb)  SpO2: 100%    Wt Readings from Last 3 Encounters:  11/15/14 68.493 kg (151 lb)  11/15/14 67.495 kg (148 lb 12.8 oz)  11/01/14 61.598 kg (135 lb 12.8 oz)    General:  Appears calm and comfortable. No respiratory distress. No peripheral central cyanosis. Eyes: PERRL, normal lids, irises & conjunctiva ENT: grossly normal hearing, lips & tongue Neck: no LAD, masses or thyromegaly Cardiovascular: Irregularly irregular, consistent with atrial fibrillation. Jugular venous pressure does  not appear to be elevated. She does have significant peripheral pitting edema from the mid lower legs to her feet.  Respiratory: CTA bilaterally, no w/r/r. Normal respiratory effort. Abdomen: soft, ntnd Skin: no rash or induration seen on limited exam Musculoskeletal: grossly normal tone BUE/BLE Psychiatric: grossly normal mood and affect, speech fluent and appropriate Neurologic: grossly non-focal.          Labs on Admission:  Basic Metabolic Panel: No results for input(s): NA, K, CL, CO2, GLUCOSE, BUN, CREATININE, CALCIUM, MG, PHOS in the last 168 hours. Liver Function Tests: No results for input(s): AST, ALT, ALKPHOS, BILITOT, PROT, ALBUMIN in the last 168 hours. No results for input(s): LIPASE, AMYLASE in the last 168 hours. No results for input(s): AMMONIA in the  last 168 hours. CBC: No results for input(s): WBC, NEUTROABS, HGB, HCT, MCV, PLT in the last 168 hours. Cardiac Enzymes: No results for input(s): CKTOTAL, CKMB, CKMBINDEX, TROPONINI in the last 168 hours.  BNP (last 3 results)  Recent Labs  04/28/14 0155 10/15/14 2217  PROBNP 1678.0* 2147.0*   CBG:  Recent Labs Lab 11/15/14 1751  GLUCAP 113*    Radiological Exams on Admission: Dg Chest 2 View  11/15/2014   CLINICAL DATA:  Bilateral lower extremity swelling x 3-4 days, mild shortness of breath  EXAM: CHEST  2 VIEW  COMPARISON:  10/20/2014  FINDINGS: Mild patchy bilateral lower lobe opacities, likely atelectasis, pneumonia not excluded.  No frank interstitial edema. Small bilateral pleural effusions. No pneumothorax.  The heart is normal in size.  Degenerative changes of the visualized thoracolumbar spine.  Cholecystectomy clips.  IMPRESSION: Mild patchy bilateral lower lobe opacities, likely atelectasis, pneumonia not excluded.  No frank interstitial edema.  Small bilateral pleural effusions.   Electronically Signed   By: Charline Bills M.D.   On: 11/15/2014 17:27      Assessment/Plan   1. Acute on  chronic diastolic congestive heart failure. Patient has gained 13 pounds, mostly peripherally. Fortunately, there does not appear to be any significant pulmonary edema although there are small bilateral pleural effusions. We will diuresis with intravenous Lasix, daily weights, strict input/output measurements. Cardiology consultation is appreciated. 2. Chronic atrial fibrillation, ventricular rate is controlled on chronic and correlation therapy. 3. Diabetes-continue with home medications and sliding scale of insulin. 4. Hypertension, appears to be stable.  Further recommendations will depend on patient's hospital progress.   Code Status: DO NOT RESUSCITATE will be continued from previous admissions.   DVT Prophylaxis: Anticoagulation continued.  Family Communication: I discussed the plan with the patient at the bedside.   Disposition Plan: Depending on progress.   Time spent: 60 mins.  Wilson Singer [facility] Pager [phone].

## 2014-11-16 DIAGNOSIS — I270 Primary pulmonary hypertension: Secondary | ICD-10-CM

## 2014-11-16 DIAGNOSIS — I10 Essential (primary) hypertension: Secondary | ICD-10-CM

## 2014-11-16 LAB — HEMOGLOBIN A1C
Hgb A1c MFr Bld: 6.4 % — ABNORMAL HIGH (ref <5.7)
Mean Plasma Glucose: 137 mg/dL — ABNORMAL HIGH (ref <117)

## 2014-11-16 LAB — BASIC METABOLIC PANEL
Anion gap: 8 (ref 5–15)
BUN: 12 mg/dL (ref 6–23)
CO2: 31 mmol/L (ref 19–32)
CREATININE: 1.06 mg/dL (ref 0.50–1.10)
Calcium: 8.6 mg/dL (ref 8.4–10.5)
Chloride: 98 mEq/L (ref 96–112)
GFR calc non Af Amer: 44 mL/min — ABNORMAL LOW (ref >90)
GFR, EST AFRICAN AMERICAN: 52 mL/min — AB (ref >90)
Glucose, Bld: 84 mg/dL (ref 70–99)
Potassium: 3.5 mmol/L (ref 3.5–5.1)
Sodium: 137 mmol/L (ref 135–145)

## 2014-11-16 LAB — GLUCOSE, CAPILLARY
GLUCOSE-CAPILLARY: 119 mg/dL — AB (ref 70–99)
GLUCOSE-CAPILLARY: 109 mg/dL — AB (ref 70–99)
Glucose-Capillary: 76 mg/dL (ref 70–99)
Glucose-Capillary: 103 mg/dL — ABNORMAL HIGH (ref 70–99)

## 2014-11-16 MED ORDER — ALBUTEROL SULFATE (2.5 MG/3ML) 0.083% IN NEBU: 2.5000 mg | INHALATION_SOLUTION | RESPIRATORY_TRACT | Status: DC | PRN

## 2014-11-16 MED ORDER — DILTIAZEM HCL 30 MG PO TABS
30.0000 mg | ORAL_TABLET | Freq: Three times a day (TID) | ORAL | Status: DC
  Administered 2014-11-16 – 2014-11-17 (×4): 30 mg via ORAL
  Filled 2014-11-16 (×4): qty 1

## 2014-11-16 MED ORDER — ALBUTEROL SULFATE HFA 108 (90 BASE) MCG/ACT IN AERS: 2.0000 | INHALATION_SPRAY | RESPIRATORY_TRACT | Status: DC | PRN

## 2014-11-16 MED ORDER — BENZONATATE 100 MG PO CAPS: 100.0000 mg | ORAL_CAPSULE | Freq: Three times a day (TID) | ORAL | Status: DC | PRN

## 2014-11-16 MED ORDER — CEPHALEXIN 500 MG PO CAPS
500.0000 mg | ORAL_CAPSULE | Freq: Three times a day (TID) | ORAL | Status: DC
  Administered 2014-11-16 – 2014-11-18 (×7): 500 mg via ORAL
  Filled 2014-11-16 (×7): qty 1

## 2014-11-16 NOTE — Clinical Social Work Psychosocial (Signed)
Clinical Social Work Department BRIEF PSYCHOSOCIAL ASSESSMENT 11/16/2014  Patient:  CHANIYA, GENTER     Account Number:  [id number]     Admit date:  11/15/2014  Clinical Social Worker:  Wyatt Haste  Date/Time:  11/16/2014 09:07 AM  Referred by:  Physician  Date Referred:  11/16/2014 Referred for  ALF Placement   Other Referral:   Interview type:  Patient Other interview type:    PSYCHOSOCIAL DATA Living Status:  FACILITY Admitted from facility:  Other Level of care:  Assisted Living Primary support name:  Voncille Lo Primary support relationship to patient:  CHILD, ADULT Degree of support available:   supportive    CURRENT CONCERNS Current Concerns  Post-Acute Placement   Other Concerns:    SOCIAL WORK ASSESSMENT / PLAN CSW met with pt at bedside. Pt alert and oriented and reports she has been a resident at [facility] since April 2015. Pt went to see cardiologist yesterday and was a direct admit due to CHF. Pt states she does well at [facility] and has adjusted a whole lot better than she imagined. She has two sons, Dominica Severin and Shanon Brow. Dominica Severin lives in [place] and sees pt every other week and Shanon Brow lives in [place]. Pt said that her sons are very involved and "want to know everything." She plans to talk to them and does not need CSW to. Pt shared that her sons were "more at ease" with her at [facility], knowing she was not alone. Pt requests to return to [place] at d/c. Per Tammy at facility, pt is fairly independent and okay to return. She ambulates with a walker. Upon return from hospital last month, pt started receiving in house home health PT and RN services.   Assessment/plan status:  Psychosocial Support/Ongoing Assessment of Needs Other assessment/ plan:   Information/referral to community resources:   Robley Fries    PATIENT'S/FAMILY'S RESPONSE TO PLAN OF CARE: Pt requests to return to [place] at d/c. She states she has a lot of friends there  and considers this "home" now.       Benay Pike, [place]

## 2014-11-16 NOTE — Progress Notes (Signed)
Carrie Heath EAV:[id number] DOB: Jan 04, 1921 DOA: 11/15/2014 PCP: Benita StabileHALL,[name]  Brief narrative: 81 y/o ? chronic CHad2Vasc2 score= on Dabigatran, DM ty 2, GERD, Chronic Dyspnea 2/2 COPD PFT 01/20/09- Severe obstructive disease, FEV1/FVC 0.47. No response. Mild restriction. [smoker till 2008], recent admission 12/5--10/20/14 DHF with Pulmonary Htn Pa Peak 39 mmHG, Patent foramen Ovale, h/o lung nodule--discharge weight was 128 LBs down from 144Lbs on admit seen at Cardiology offie 11/14/14 and 2/2 to dietary indiscretion as well as weight gain 13 Lb ? RLE cellulitis re-admitted 11/15/13 with decompensated heart failure  Past medical history-As per Problem list Chart reviewed as below- reviewed  Consultants:  Cardiology   Procedures:none  Antibiotics:  None   Subjective  Doing well. No shortness of breath. No fever No chills Tolerating diet Lives independently since April 2015 at [place] house Independent for the most part has not had a fall recently Admits to dietary indiscretion but is compliant on medications No other complaints at present time Denies chest pain   Objective    Interim History: Noted mildly hypotensive this morning  Telemetry: Rate controlled atrial fibrillation   Objective: Filed Vitals:   11/15/14 1713 11/15/14 2125 11/16/14 0538 11/16/14 0807  BP: 128/74 108/53 101/48 114/58  Pulse: 54 62 65   Temp: 98 F (36.7 C) 98 F (36.7 C) 97.8 F (36.6 C)   TempSrc: Oral Oral Oral   Resp: 20 20 21    Height: 5' (1.524 m)     Weight: 68.493 kg (151 lb)  69.491 kg (153 lb 3.2 oz)   SpO2: 100% 100% 100%     Intake/Output Summary (Last 24 hours) at 11/16/14 0823 Last data filed at 11/16/14 0559  Gross per 24 hour  Intake    240 ml  Output   2500 ml  Net  -2260 ml    Exam:  General: Alert pleasant oriented no apparent distress no accessory muscle use Cardiovascular: S1-S2 slightly irregular however rate controlled no murmur, no JVD Respiratory:  Clinically clear no added sound, no rales, no rhonchi Abdomen: Soft nontender nondistended no rebound Skin grade 1-2 lower extremity edema, right lower extremity appears more red on the front of the shin Neuro grossly intact moving all 4 limbs equally power intact, oriented  Data Reviewed: Basic Metabolic Panel:  Recent Labs Lab 11/15/14 1759 11/16/14 0024  NA 140 137  K 3.6 3.5  CL 98 98  CO2 33* 31  GLUCOSE 114* 84  BUN 13 12  CREATININE 1.15* 1.06  CALCIUM 9.4 8.6   Liver Function Tests:  Recent Labs Lab 11/15/14 1759  AST 21  ALT 19  ALKPHOS 86  BILITOT 0.5  PROT 7.1  ALBUMIN 3.9   No results for input(s): LIPASE, AMYLASE in the last 168 hours. No results for input(s): AMMONIA in the last 168 hours. CBC:  Recent Labs Lab 11/15/14 1759  WBC 7.6  NEUTROABS 5.5  HGB 11.5*  HCT 35.5*  MCV 95.2  PLT 229   Cardiac Enzymes: No results for input(s): CKTOTAL, CKMB, CKMBINDEX, TROPONINI in the last 168 hours. BNP: Invalid input(s): POCBNP CBG:  Recent Labs Lab 11/15/14 1751 11/15/14 2102 11/16/14 0805  GLUCAP 113* 120* 76    No results found for this or any previous visit (from the past 240 hour(s)).   Studies:              All Imaging reviewed and is as per above notation   Scheduled Meds: . Chlorhexidine Gluconate Cloth  6 each  Topical Q0600  . dabigatran  75 mg Oral Q12H  . diltiazem  30 mg Oral 3 times per day  . furosemide  40 mg Intravenous Q12H  . gabapentin  600 mg Oral 4 times per day  . insulin aspart  0-15 Units Subcutaneous TID WC  . insulin aspart  0-5 Units Subcutaneous QHS  . metFORMIN  500 mg Oral BID WC  . mupirocin ointment  1 application Nasal BID  . pantoprazole  40 mg Oral Daily  . potassium chloride  20 mEq Oral BID  . pravastatin  40 mg Oral q1800  . predniSONE  2.5 mg Oral Q breakfast  . sodium chloride  3 mL Intravenous Q12H   Continuous Infusions:    This is a pleasant 81 year old female just recently discharged  10/20/14 admitted for acute decompensation of probable diastolic dysfunction-she also has a questionable cellulitis on the right lower extremity cardiology is seeing the patient in consult  Assessment/Plan:  Acute decompensation of probable multifactorial respiratory/cardiapuomonary issues- likely cause of weight gain is to both right-sided heart failure as well as chronic diastolic heart failure-treatment with a relatively the same. She will require D saturation screen, we will continue to diuresis with IV Lasix 40 mg every 12 hours and we will get daily weights, so far she has put out cumulatively 2.26 L although her weight is inaccurate COPD-clinic compensated and no wheeze. Does not require burst of steroids or scheduled albuterol at Present Atrial fibrillation CHad2Vasc2 score>4= patient will need to continue on Pradaxa 75 every 12. She was slightly hypertensive this morning so I transitioned her from Cardizem 120 CD to Cardizem 30 mg every 8 Diabetes mellitus well controlled with evidences of peripheral neuropathy-A1c 6.4. Caution with use of metformin 500 twice a day as diuresis. Repeat labs in the a.m. continue sliding scale coverage. Continue gabapentin 600 4 times a day Hyperlipidemia continue Pravachol 40 mg daily Reflux disease-continue PPI Neck pain-probably cervical degenerative disc disease-outpatient follow-up, continue tramadol 25 twice a day when necessary pain    Code Status: Full code Family Communication: None at bedside Disposition Plan: Inpatient   Pleas Koch, MD  [facility] Pager [phone] 11/16/2014, 8:23 AM    LOS: 1 day

## 2014-11-16 NOTE — Progress Notes (Signed)
Subjective:  Feeling better. No complaints.  Objective:  Vital Signs in the last 24 hours: Temp:  [97.8 F (36.6 C)-98 F (36.7 C)] 97.8 F (36.6 C) (01/06 0538) Pulse Rate:  [51-65] 65 (01/06 0538) Resp:  [20-21] 21 (01/06 0538) BP: (101-128)/(48-74) 114/58 mmHg (01/06 0807) SpO2:  [95 %-100 %] 100 % (01/06 0538) Weight:  [148 lb 12.8 oz (67.495 kg)-153 lb 3.2 oz (69.491 kg)] 153 lb 3.2 oz (69.491 kg) (01/06 0538)  Intake/Output from previous day: 01/05 0701 - 01/06 0700 In: 240 [P.O.:240] Out: 2500 [Urine:2500] Intake/Output from this shift:    Physical Exam: NECK: Without JVD, HJR, or bruit LUNGS: Decreased breath sounds throughout. HEART:Irregular rate and rhythm, 1/6 systolic murmur LSB,no gallop, rub, bruit, thrill, or heave EXTREMITIES: edema improving. Cellulitis noted.   Lab Results:  Recent Labs  11/15/14 1759  WBC 7.6  HGB 11.5*  PLT 229    Recent Labs  11/15/14 1759 11/16/14 0024  NA 140 137  K 3.6 3.5  CL 98 98  CO2 33* 31  GLUCOSE 114* 84  BUN 13 12  CREATININE 1.15* 1.06   No results for input(s): TROPONINI in the last 72 hours.  Invalid input(s): CK, MB Hepatic Function Panel  Recent Labs  11/15/14 1759  PROT 7.1  ALBUMIN 3.9  AST 21  ALT 19  ALKPHOS 86  BILITOT 0.5   No results for input(s): CHOL in the last 72 hours. No results for input(s): PROTIME in the last 72 hours.  Imaging:   Cardiac Studies:  Assessment/Plan:  Atrial fibrillation with RVR, now controlled  Acute on chronic Diastolic CHF EF 65-70%. Diuresed 2260 over night. Continue IV Lasix today.  Cellulitis BLE on Keflex.  COPD exacerbation   LOS: 1 day    Jacolyn ReedyMichele Lenze 11/16/2014, 8:59 AM  Attending note  Patient seen and discussed with PA Geni BersLenze, I agree with her documentation above. Regarding acute on chronic diastolic HF, patient is negative 3.2 liters since admission, Cr improving with diuresis consistent with CHF and venous congestion. She is on  lasix IV 40mg  IV bid, will continue today as she continues to be volume overloaded. Some soft bp's in AM and low heart rates in early AM hours, agree with changing to short acting dilt and following pressures, continue pradaxa for stroke prevention in setting of afib.   Dominga FerryJ Sumayah Bearse MD

## 2014-11-16 NOTE — Progress Notes (Signed)
   11/16/14 1408  Mobility  Activity Ambulate in room;Chair  Level of Assistance Modified independent, requires aide device or extra time  Ambulation Response Tolerated well

## 2014-11-16 NOTE — Plan of Care (Signed)
Problem: Food- and Nutrition-Related Knowledge Deficit (NB-1.1) Goal: Nutrition education Formal process to instruct or train a patient/client in a skill or to impart knowledge to help patients/clients voluntarily manage or modify food choices and eating behavior to maintain or improve health. Outcome: Adequate for Discharge Nutrition Education Note  RD consulted for nutrition education regarding CHF.  RD provided "Heart failure Nutrition Therapy" and Sodium content of foods handouts from the Academy of Nutrition and Dietetics. Reviewed patient's dietary recall. Provided examples on ways to decrease sodium intake in diet. Discouraged intake of processed foods and use of salt shaker. Encouraged fresh fruits and vegetables as well as whole grain sources of carbohydrates to maximize fiber intake.   RD discussed why it is important for patient to adhere to diet recommendations, and emphasized the role of fluids, foods to avoid, and importance of weighing self daily. Teach back method used.  Expect fair compliance. Dietary compliance dependent at least in part on foods provided for pt at ALF.  Body mass index is 29.92 kg/(m^2). Pt meets criteria for overweight based on current BMI.  Current diet order is CHO modified, patient is consuming approximately 50% of meals at this time. Labs and medications reviewed. No further nutrition interventions warranted at this time.  Recommend to include with discharge instructions: Low Sodium/CHO consistent diet Daily weight monitoring  Royann ShiversLynn Yanett Conkright MS,RD,CSG,LDN Office: [phone] Pager: [phone]

## 2014-11-16 NOTE — Care Management Note (Addendum)
    Page 1 of 1   11/18/2014     12:13:39 PM CARE MANAGEMENT NOTE 11/18/2014  Patient:  Carrie Heath,Carrie Heath   Account Number:  [id number]  Date Initiated:  11/16/2014  Documentation initiated by:  Sharrie RothmanBLACKWELL,Josua Ferrebee C  Subjective/Objective Assessment:   Pt admitted from [facility] [place] with CHF. Pt willl return to facility at discharge. Pt receives Public relations account executiveinhouse RN and PT.     Action/Plan:   CSW is aware and will arrange discharge to facility when medically stable.   Anticipated DC Date:  11/19/2014   Anticipated DC Plan:  ASSISTED LIVING / REST HOME  In-house referral  Clinical Social Worker      DC Planning Services  CM consult      Choice offered to / List presented to:             Status of service:  Completed, signed off Medicare Important Message given?  YES (If response is "NO", the following Medicare IM given date fields will be blank) Date Medicare IM given:  11/18/2014 Medicare IM given by:  Sharrie RothmanBLACKWELL,Mignon Bechler C Date Additional Medicare IM given:   Additional Medicare IM given by:    Discharge Disposition:  ASSISTED LIVING  Per UR Regulation:    If discussed at Long Length of Stay Meetings, dates discussed:    Comments:  11/18/14 1210 Arlyss Queenammy Noa Galvao, RN BSN CM Pt discharged back to [facility] today. CSW to arrange discharge to facility.   11/16/14 1150 Gwenyth Bouillonamy Zaryan Yakubov, RN BSN CM

## 2014-11-16 NOTE — Assessment & Plan Note (Signed)
Resolving flare -  Plan  Take Tessalon Perles Three times a day  As needed  Cough.  Use Xopenex Neb every 4 hrs as needed for wheezing.  Taper Prednisone to 2.5mg  daily  Follow up Dr. Maple HudsonYoung  In 6 weeks and As needed

## 2014-11-16 NOTE — Assessment & Plan Note (Signed)
Recent flare with hospital admission -improved with diureiss  Cont with follow up with cards

## 2014-11-16 NOTE — Progress Notes (Signed)
UR chart review completed.  

## 2014-11-17 ENCOUNTER — Inpatient Hospital Stay: Payer: BC Managed Care – PPO | Admitting: Adult Health

## 2014-11-17 LAB — GLUCOSE, CAPILLARY
GLUCOSE-CAPILLARY: 91 mg/dL (ref 70–99)
GLUCOSE-CAPILLARY: 114 mg/dL — AB (ref 70–99)
Glucose-Capillary: 134 mg/dL — ABNORMAL HIGH (ref 70–99)
Glucose-Capillary: 119 mg/dL — ABNORMAL HIGH (ref 70–99)

## 2014-11-17 LAB — CBC
HCT: 32.7 % — ABNORMAL LOW (ref 36.0–46.0)
HEMOGLOBIN: 10.7 g/dL — AB (ref 12.0–15.0)
MCH: 31.2 pg (ref 26.0–34.0)
MCHC: 32.7 g/dL (ref 30.0–36.0)
MCV: 95.3 fL (ref 78.0–100.0)
Platelets: 195 10*3/uL (ref 150–400)
RBC: 3.43 MIL/uL — ABNORMAL LOW (ref 3.87–5.11)
RDW: 13.9 % (ref 11.5–15.5)
WBC: 6.6 10*3/uL (ref 4.0–10.5)

## 2014-11-17 LAB — COMPREHENSIVE METABOLIC PANEL
ALT: 13 U/L (ref 0–35)
ANION GAP: 6 (ref 5–15)
AST: 15 U/L (ref 0–37)
Albumin: 3.1 g/dL — ABNORMAL LOW (ref 3.5–5.2)
Alkaline Phosphatase: 70 U/L (ref 39–117)
BUN: 15 mg/dL (ref 6–23)
CO2: 33 mmol/L — AB (ref 19–32)
CREATININE: 1.03 mg/dL (ref 0.50–1.10)
Calcium: 8.4 mg/dL (ref 8.4–10.5)
Chloride: 99 mEq/L (ref 96–112)
GFR, EST AFRICAN AMERICAN: 53 mL/min — AB (ref >90)
GFR, EST NON AFRICAN AMERICAN: 46 mL/min — AB (ref >90)
Glucose, Bld: 84 mg/dL (ref 70–99)
Potassium: 3.6 mmol/L (ref 3.5–5.1)
Sodium: 138 mmol/L (ref 135–145)
Total Bilirubin: 0.5 mg/dL (ref 0.3–1.2)
Total Protein: 5.6 g/dL — ABNORMAL LOW (ref 6.0–8.3)

## 2014-11-17 LAB — MAGNESIUM: Magnesium: 1.6 mg/dL (ref 1.5–2.5)

## 2014-11-17 MED ORDER — DILTIAZEM HCL ER 60 MG PO CP12
60.0000 mg | ORAL_CAPSULE | Freq: Two times a day (BID) | ORAL | Status: DC
  Administered 2014-11-17 – 2014-11-18 (×3): 60 mg via ORAL
  Filled 2014-11-17 (×9): qty 1

## 2014-11-17 MED ORDER — FUROSEMIDE 10 MG/ML IJ SOLN
40.0000 mg | Freq: Two times a day (BID) | INTRAMUSCULAR | Status: AC
  Administered 2014-11-17: 40 mg via INTRAVENOUS
  Filled 2014-11-17: qty 4

## 2014-11-17 MED ORDER — TORSEMIDE 20 MG PO TABS
20.0000 mg | ORAL_TABLET | Freq: Every day | ORAL | Status: DC
  Administered 2014-11-17: 20 mg via ORAL
  Filled 2014-11-17: qty 1

## 2014-11-17 NOTE — Progress Notes (Signed)
Carrie Heath:[id number] DOB: 18-Sep-1921 DOA: 11/15/2014 PCP: Benita Stabile  Brief narrative: 81 y/o ? chronic CHad2Vasc2 score= on Dabigatran, DM ty 2, GERD, Chronic Dyspnea 2/2 COPD PFT 01/20/09- Severe obstructive disease, FEV1/FVC 0.47. No response. Mild restriction. [smoker till 2008], recent admission 12/5--10/20/14 DHF with Pulmonary Htn Pa Peak 39 mmHG, Patent foramen Ovale, h/o lung nodule--discharge weight was 128 LBs down from 144Lbs on admit seen at Cardiology offie 11/14/14 and 2/2 to dietary indiscretion as well as weight gain 13 Lb ? RLE cellulitis re-admitted 11/15/13 with decompensated heart failure  Past medical history-As per Problem list Chart reviewed as below- reviewed  Consultants:  Cardiology   Procedures:none  Antibiotics:  None   Subjective  Doing well. No shortness of breath. No fever No chills Tolerating diet Lives independently since April 2015 at [facility] Independent for the most part has not had a fall recently Admits to dietary indiscretion but is compliant on medications No other complaints at present time Denies chest pain   Objective    Interim History: Noted mildly hypotensive this morning  Telemetry: Rate controlled atrial fibrillation   Objective: Filed Vitals:   11/16/14 0807 11/16/14 1448 11/16/14 2316 11/17/14 0500  BP: 114/58 114/64 104/54 100/61  Pulse:  66 62 62  Temp:  98.4 F (36.9 C) 98 F (36.7 C) 97.7 F (36.5 C)  TempSrc:  Oral Oral Oral  Resp:  Height:      Weight:    67.813 kg (149 lb 8 oz)  SpO2:  98% 100% 99%    Intake/Output Summary (Last 24 hours) at 11/17/14 0829 Last data filed at 11/17/14 8119  Gross per 24 hour  Intake    240 ml  Output   2975 ml  Net  -2735 ml    Exam:  General: Alert pleasant oriented no apparent distress no accessory muscle use Cardiovascular: S1-S2 slightly irregular however rate controlled no murmur, no JVD Respiratory: Clinically clear no added  sound, no rales, no rhonchi Abdomen: Soft nontender nondistended no rebound Skin grade 1-2 lower extremity edema, right lower extremity to feel and look that up and previously Neuro grossly intact moving all 4 limbs equally power intact, oriented  Data Reviewed: Basic Metabolic Panel:  Recent Labs Lab 11/15/14 1759 11/16/14 0024 11/17/14 0531  NA 140 137 138  K 3.6 3.5 3.6  CL 98 98 99  CO2 33* 31 33*  GLUCOSE 114* 84 84  BUN CREATININE 1.15* 1.06 1.03  CALCIUM 9.4 8.6 8.4  MG  --   --  1.6   Liver Function Tests:  Recent Labs Lab 11/15/14 1759 11/17/14 0531  AST 21 15  ALT 19 13  ALKPHOS 86 70  BILITOT 0.5 0.5  PROT 7.1 5.6*  ALBUMIN 3.9 3.1*   No results for input(s): LIPASE, AMYLASE in the last 168 hours. No results for input(s): AMMONIA in the last 168 hours. CBC:  Recent Labs Lab 11/15/14 1759 11/17/14 0531  WBC 7.6 6.6  NEUTROABS 5.5  --   HGB 11.5* 10.7*  HCT 35.5* 32.7*  MCV 95.2 95.3  PLT 229 195   Cardiac Enzymes: No results for input(s): CKTOTAL, CKMB, CKMBINDEX, TROPONINI in the last 168 hours. BNP: Invalid input(s): POCBNP CBG:  Recent Labs Lab 11/16/14 0805 11/16/14 1201 11/16/14 1732 11/16/14 2318 11/17/14 0716  GLUCAP 76 119* 103* 109* 91    No results found for this or any previous visit (from the past 240  hour(s)).   Studies:              All Imaging reviewed and is as per above notation   Scheduled Meds: . cephALEXin  500 mg Oral 3 times per day  . Chlorhexidine Gluconate Cloth  6 each Topical Q0600  . dabigatran  75 mg Oral Q12H  . diltiazem  30 mg Oral 3 times per day  . furosemide  40 mg Intravenous Q12H  . gabapentin  600 mg Oral 4 times per day  . insulin aspart  0-15 Units Subcutaneous TID WC  . insulin aspart  0-5 Units Subcutaneous QHS  . metFORMIN  500 mg Oral BID WC  . mupirocin ointment  1 application Nasal BID  . pantoprazole  40 mg Oral Daily  . potassium chloride  20 mEq Oral BID  .  pravastatin  40 mg Oral q1800  . predniSONE  2.5 mg Oral Q breakfast  . sodium chloride  3 mL Intravenous Q12H   Continuous Infusions:    This is a pleasant 81 year old female just recently discharged 10/20/14 admitted for acute decompensation of probable diastolic dysfunction-she also has a questionable cellulitis on the right lower extremity cardiology is seeing the patient in consult  Assessment/Plan:   Acute decompensation of probable multifactorial respiratory/cardiopulomonary issues-likely cause of weight gain is to both right-sided heart failure as well as chronic diastolic heart failure-treatment with a relatively the same. l continue to diuresis with IV Lasix 40 mg every 12 hours and we will get daily weights, so far she has put out cumulatively -4.9 L  Defer to cardiology when to transition to PO diuretics  Grade 3/stage III pulmonary hypertension-PA peak Rusher 39 mmHg-defer decision to refer to advanced heart failure clinic to primary cardiologist-evidence suggests that diuresis and chronic oxygen are mainstays of treatment and I believe low-dose O2 is medically necessary on discharge to prevent her from relapsing  COPD-clinic compensated and no wheeze. Does not require burst of steroids or scheduled albuterol at Present  Atrial fibrillation CHad2Vasc2 score>4= patient will need to continue on Pradaxa 75 every 12. She was slightly hypertensive this morning so I transitioned her from Cardizem 120 CD to Cardizem 30 mg every 8-=-I did switch her today to Cardizem 60 xl bid  Diabetes mellitus well controlled with evidences of peripheral neuropathy-A1c 6.4. Caution with use of metformin 500 twice a day as diuresis. Repeat labs in the a.m. continue sliding scale coverage. Continue gabapentin 600 4 times a day Blood sugars tightly controlled between 91 and 119  Hyperlipidemia continue Pravachol 40 mg daily  Reflux disease-continue PPI  Neck pain-probably cervical degenerative disc  disease-outpatient follow-up, continue tramadol 25 twice a day when necessary pain  Possible right lower extremity cellulitis-continue Keflex by mouth 3 times a day, stop date 11/22/14    Code Status: Full code Family Communication: None at bedside Disposition Plan: Inpatient   Pleas KochJai Fani Rotondo, MD  [facility] Pager [phone] 11/17/2014, 8:29 AM    LOS: 2 days

## 2014-11-17 NOTE — Evaluation (Signed)
Physical Therapy Evaluation Patient Details Name: Carrie Heath MRN: [id number] DOB: [birth year] Today's Date: 11/17/2014   History of Present Illness  This is a 81 year old lady who has a previous history of diastolic congestive heart failure and permanent atrial fibrillation on chronic anti-coagulation therapy who now presents with increasing leg edema and swelling associated with a 13 pound weight gain since last documented approximately 2 weeks ago. She denies worsening dyspnea, chest pain or palpitations. She denies fever, nausea or vomiting. It was felt that she was in acute on chronic congestive heart failure and she is now being admitted for further management of this.  Clinical Impression  Pt is a 81 year old female who presents to PT with a dx of acute on chronic diastolic CHF.  Pt lives at an ALF, and reports she is typically mod (I) with use of personal rollator.  During evaluation, pt was mod (I) with bed mobility skills, though reports she typically sleeps in her lift chair, and mod (I) with transfers and gait.  Gait distance limited secondary to knee pain/decreased activity tolerance.  Pt reports she is typically able to amb from room at ALF to dining hall without having to sit down.  Pt may require HHPT at ALF to address activity tolerance if she is unable to complete this at facility.  Pt to be d/c from acute PT services.  No DME recommendations.     Follow Up Recommendations Home health PT    Equipment Recommendations  None recommended by PT       Precautions / Restrictions Precautions Precautions: Fall Restrictions Weight Bearing Restrictions: No      Mobility  Bed Mobility Overal bed mobility: Modified Independent             General bed mobility comments: Use of handrails.  Pt does report at the ALF she sleeps in a lift chair.  Transfers Overall transfer level: Modified independent Equipment used: 4-wheeled walker                 Ambulation/Gait Ambulation/Gait assistance: Modified independent (Device/Increase time) Ambulation Distance (Feet): 125 Feet Assistive device: 4-wheeled walker Gait Pattern/deviations: Step-through pattern;Decreased stride length   Gait velocity interpretation: at or above normal speed for age/gender General Gait Details: Multiple standing rest breaks secondary to complaints of knee pain      Balance Overall balance assessment: No apparent balance deficits (not formally assessed)                                           Pertinent Vitals/Pain Pain Assessment: No/denies pain (Only "neuropathy" in Rt LE)    Home Living Family/patient expects to be discharged to:: Assisted living               Home Equipment: Environmental consultant - 4 wheels Brewing technologist)      Prior Function Level of Independence: Independent with assistive device(s)         Comments: Pt mod (I) with transfers and gait in facility with use of personal rollator.  Pt reports she sleeps in her lift chair.         Extremity/Trunk Assessment               Lower Extremity Assessment: Overall WFL for tasks assessed         Communication   Communication: HOH  Cognition Arousal/Alertness: Awake/alert Behavior During Therapy: [facility]  for tasks assessed/performed Overall Cognitive Status: Within Functional Limits for tasks assessed                       Assessment/Plan    PT Assessment All further PT needs can be met in the next venue of care  PT Diagnosis Other (comment);Difficulty walking (Decreased activity tolerance)   PT Problem List Decreased activity tolerance;Decreased mobility  PT Treatment Interventions     PT Goals (Current goals can be found in the Care Plan section) Acute Rehab PT Goals PT Goal Formulation: All assessment and education complete, DC therapy     End of Session Equipment Utilized During Treatment: Gait belt Activity Tolerance: Patient tolerated  treatment well Patient left: in chair;with call bell/phone within reach;with chair alarm set           Time: 1535-1558 PT Time Calculation (min) (ACUTE ONLY): 23 min   Charges:   PT Evaluation $Initial PT Evaluation Tier I: 1 Procedure     Lonna Cobb, DPT [phone]

## 2014-11-17 NOTE — Progress Notes (Signed)
Consulting cardiologist: Dina Rich MD Primary Cardiologist: Prentice Docker MD  Cardiology Specific Problem List: 1.Acute on Chronic Diastolic CHF 2. Hypertension 3. Atrial fibrillation  Subjective:    Feeling much better. Legs less edematous and erythema improved.  Objective:   Temp:  [97.7 F (36.5 C)-98.4 F (36.9 C)] 97.7 F (36.5 C) (01/07 0500) Pulse Rate:  [62-66] 62 (01/07 0500) Resp:  [18] 18 (01/07 0500) BP: (100-114)/(54-64) 100/61 mmHg (01/07 0500) SpO2:  [98 %-100 %] 99 % (01/07 0500) Weight:  [149 lb 8 oz (67.813 kg)] 149 lb 8 oz (67.813 kg) (01/07 0500) Last BM Date: 11/15/14  Filed Weights   11/15/14 1713 11/16/14 0538 11/17/14 0500  Weight: 151 lb (68.493 kg) 153 lb 3.2 oz (69.491 kg) 149 lb 8 oz (67.813 kg)    Intake/Output Summary (Last 24 hours) at 11/17/14 0834 Last data filed at 11/17/14 0639  Gross per 24 hour  Intake    240 ml  Output   2975 ml  Net  -2735 ml    Telemetry: Atrial fib with rates in the 70's.   Exam:  General: No acute distress.  HEENT: Conjunctiva and lids normal, oropharynx clear.  Lungs: Clear to auscultation, with bibasilar crackles, without wheezes.  Cardiac: No elevated JVP or bruits. RRR, no gallop or rub.   Abdomen: Normoactive bowel sounds, nontender, nondistended.  Extremities: No pitting edema, distal pulses full. Erythema improving. Less red. Some scaling.  Neuropsychiatric: Alert and oriented x3, easily forgetful, affect appropriate.   Lab Results:  Basic Metabolic Panel:  Recent Labs Lab 11/15/14 1759 11/16/14 0024 11/17/14 0531  NA 140 137 138  K 3.6 3.5 3.6  CL 98 98 99  CO2 33* 31 33*  GLUCOSE 114* 84 84  BUN CREATININE 1.15* 1.06 1.03  CALCIUM 9.4 8.6 8.4  MG  --   --  1.6    Liver Function Tests:  Recent Labs Lab 11/15/14 1759 11/17/14 0531  AST 21 15  ALT 19 13  ALKPHOS 86 70  BILITOT 0.5 0.5  PROT 7.1 5.6*  ALBUMIN 3.9 3.1*    CBC:  Recent  Labs Lab 11/15/14 1759 11/17/14 0531  WBC 7.6 6.6  HGB 11.5* 10.7*  HCT 35.5* 32.7*  MCV 95.2 95.3  PLT 229 195   BNP:  Recent Labs  04/28/14 0155 10/15/14 2217  PROBNP 1678.0* 2147.0*    Coagulation: No results for input(s): INR in the last 168 hours.  Radiology: Dg Chest 2 View  11/15/2014   CLINICAL DATA:  Bilateral lower extremity swelling x 3-4 days, mild shortness of breath  EXAM: CHEST  2 VIEW  COMPARISON:  10/20/2014  FINDINGS: Mild patchy bilateral lower lobe opacities, likely atelectasis, pneumonia not excluded.  No frank interstitial edema. Small bilateral pleural effusions. No pneumothorax.  The heart is normal in size.  Degenerative changes of the visualized thoracolumbar spine.  Cholecystectomy clips.  IMPRESSION: Mild patchy bilateral lower lobe opacities, likely atelectasis, pneumonia not excluded.  No frank interstitial edema.  Small bilateral pleural effusions.   Electronically Signed   By: Charline Bills M.D.   On: 11/15/2014 17:27     Medications:   Scheduled Medications: . cephALEXin  500 mg Oral 3 times per day  . Chlorhexidine Gluconate Cloth  6 each Topical Q0600  . dabigatran  75 mg Oral Q12H  . diltiazem  30 mg Oral 3 times per day  . furosemide  40 mg Intravenous Q12H  . gabapentin  600 mg Oral 4 times per day  . insulin aspart  0-15 Units Subcutaneous TID WC  . insulin aspart  0-5 Units Subcutaneous QHS  . metFORMIN  500 mg Oral BID WC  . mupirocin ointment  1 application Nasal BID  . pantoprazole  40 mg Oral Daily  . potassium chloride  20 mEq Oral BID  . pravastatin  40 mg Oral q1800  . predniSONE  2.5 mg Oral Q breakfast  . sodium chloride  3 mL Intravenous Q12H      PRN Medications: albuterol, benzonatate, traMADol   Assessment and Plan:   1. Acute on Chronic Diastolic CHF: . She has diuresed  5 liters since admission on IV lasix. Continue IV diuretics today, transition to oral tomorrow.    2. Atrial fib:  Rate is currently  well controlled. Continue Pradaxa 75 mg BID, and diltiazem   3. Hypertension: . BP is soft, but she is not active, sitting most of the day. Uses walker for ambulation. Consider in PT evaluation before going home.   Bettey MareKathryn M. Lawrence NP [facility]  11/17/2014, 8:34 AM   Patient seen and discussed with NP Lawerence, I agree with her documentation. Regarding her acute on chronic diastolic HF she is negative 2.7 liters yesterday,negative 5 liters since admission. Cr trending down with diuresis consistent with CHF and venous congestion, she is on lasix 40mg  IV bid. Still with some signs of volume overload, continue IV diuretics today and likely change to oral tomorrow. Continue dilt for afib rate control, changed from 24hr dilt to bid 12hr dilt due to some soft bp's and borderline low heart rates at times. Elevated PASP on prior echo, likely related to left sided diastolic dysfunction, perhaps a component of COPD. Agree with primary team that she should be assessed for home O2 if chronically hypoxic at rest or with exertion. Anticipate likely discharge tomorrow.    Dominga FerryJ Lorren Splawn MD

## 2014-11-17 NOTE — Clinical Social Work Note (Signed)
CSW discussed possibility of SNF with pt per medical advisor recommendation. She immediately stated, "No." [facility]. CSW discussed with her that she will need to follow a very strict diet. She reports she will "try" to follow recommendations. CSW also talked with Tammy at [facility]. She feels they can continue to meet pt's needs and can certainly provide recommended diet, but agreed pt would have to be compliant as well.   Derenda FennelKara Michaelanthony [place]on, [place] [phone]

## 2014-11-17 NOTE — Progress Notes (Signed)
   11/17/14 1500  Mobility  Activity Ambulate in room  Level of Assistance Modified independent, requires aide device or extra time  Distance Ambulated (ft) 40 ft  Ambulation Response Tolerated well

## 2014-11-18 LAB — CBC
HEMATOCRIT: 34.4 % — AB (ref 36.0–46.0)
Hemoglobin: 11.1 g/dL — ABNORMAL LOW (ref 12.0–15.0)
MCH: 30.7 pg (ref 26.0–34.0)
MCHC: 32.3 g/dL (ref 30.0–36.0)
MCV: 95.3 fL (ref 78.0–100.0)
PLATELETS: 204 10*3/uL (ref 150–400)
RBC: 3.61 MIL/uL — ABNORMAL LOW (ref 3.87–5.11)
RDW: 13.7 % (ref 11.5–15.5)
WBC: 6.6 10*3/uL (ref 4.0–10.5)

## 2014-11-18 LAB — GLUCOSE, CAPILLARY
Glucose-Capillary: 94 mg/dL (ref 70–99)
Glucose-Capillary: 249 mg/dL — ABNORMAL HIGH (ref 70–99)

## 2014-11-18 LAB — COMPREHENSIVE METABOLIC PANEL
ALT: 11 U/L (ref 0–35)
AST: 16 U/L (ref 0–37)
Albumin: 3.2 g/dL — ABNORMAL LOW (ref 3.5–5.2)
Alkaline Phosphatase: 72 U/L (ref 39–117)
Anion gap: 8 (ref 5–15)
BUN: 16 mg/dL (ref 6–23)
CO2: 33 mmol/L — ABNORMAL HIGH (ref 19–32)
Calcium: 8.3 mg/dL — ABNORMAL LOW (ref 8.4–10.5)
Chloride: 96 mEq/L (ref 96–112)
Creatinine, Ser: 1.01 mg/dL (ref 0.50–1.10)
GFR calc Af Amer: 55 mL/min — ABNORMAL LOW (ref >90)
GFR calc non Af Amer: 47 mL/min — ABNORMAL LOW (ref >90)
GLUCOSE: 86 mg/dL (ref 70–99)
POTASSIUM: 3.3 mmol/L — AB (ref 3.5–5.1)
Sodium: 137 mmol/L (ref 135–145)
TOTAL PROTEIN: 5.7 g/dL — AB (ref 6.0–8.3)
Total Bilirubin: 0.7 mg/dL (ref 0.3–1.2)

## 2014-11-18 MED ORDER — DILTIAZEM HCL ER 60 MG PO CP12: 60.0000 mg | ORAL_CAPSULE | Freq: Two times a day (BID) | ORAL | Status: DC

## 2014-11-18 MED ORDER — FUROSEMIDE 40 MG PO TABS: 40.0000 mg | ORAL_TABLET | Freq: Two times a day (BID) | ORAL | Status: DC

## 2014-11-18 MED ORDER — ZOLPIDEM TARTRATE 5 MG PO TABS
5.0000 mg | ORAL_TABLET | Freq: Once | ORAL | Status: AC
Start: 2014-11-18 — End: 2014-11-18
  Administered 2014-11-18: 5 mg via ORAL
  Filled 2014-11-18: qty 1

## 2014-11-18 MED ORDER — CEPHALEXIN 500 MG PO CAPS: 500.0000 mg | ORAL_CAPSULE | Freq: Three times a day (TID) | ORAL | Status: DC

## 2014-11-18 MED ORDER — FUROSEMIDE 40 MG PO TABS
40.0000 mg | ORAL_TABLET | Freq: Two times a day (BID) | ORAL | Status: DC
  Administered 2014-11-18: 40 mg via ORAL
  Filled 2014-11-18: qty 1

## 2014-11-18 NOTE — Clinical Social Work Note (Signed)
[facility] d/[facility]. Pt to talk with nutritionist prior to d/c regarding diet recommendations. Pt's son, Onalee HuaDavid is here and will take pt later today. Facility aware and agreeable. D/C summary and FL2 faxed.  Derenda FennelKara Maleeya [place], [place] [phone]

## 2014-11-18 NOTE — Progress Notes (Signed)
Pt would like something to help with sleep. Paged on-call MD, will follow orders given.

## 2014-11-18 NOTE — Progress Notes (Signed)
[facility] was [facility].  Patient was given exit care on low sodium diet and educated on fluid restrictions and the importance of these instructions in regard to her diagnosis of HF.  IV was removed with catheter intact, no bleeding or complications.  Patient left unit in stable condition by staff member in wheelchair.

## 2014-11-18 NOTE — Discharge Summary (Addendum)
Physician Discharge Summary  Carrie Heath UJW:[id number] DOB: 1921/05/18 DOA: 11/15/2014  PCP: Nita Sells Z  Admit date: 11/15/2014 Discharge date: 11/18/2014  Time spent: 35 minutes  Recommendations for Outpatient Follow-up:  1. Needs basic metabolic panel as well as CBC in about 4 days 2. Needs close outpatient follow-up with regular cardiologist in about one week-appointment to be made by cardiology 3. Needs fluid restriction 1500 cc as well as sodium restriction less than 2 g as an outpatient at facility 4. GET DAILY WEIGHTS PLEASE-If she gains more than 2 pounds in a 24-hour period of time, patient will need an extra dose of by mouth Lasix and her cardiologist should be contacted for further instructions 5. Please note changes to her diltiazem as below 6. Please also note patient may need a soft diet/dentures because she is not able to chew a regular diet.  7. Patient will be discharged to assisted living facility today 8. Consider close follow-up with pulmonology as well as she has multifactorial dyspnea however this episode was felt to be more likely secondary to heart failure versus COPD  Discharge Diagnoses:  Active Problems:   DM type 2 with diabetic peripheral neuropathy   Atrial fibrillation with controlled ventricular response   Acute on chronic diastolic CHF (congestive heart failure)   Accelerated hypertension   Acute on chronic diastolic congestive heart failure   Discharge Condition: Improved  Diet recommendation: Heart healthy low-salt fluid restricted  Filed Weights   11/16/14 0538 11/17/14 0500 11/18/14 0500  Weight: 69.491 kg (153 lb 3.2 oz) 67.813 kg (149 lb 8 oz) 65.772 kg (145 lb)    Brief narrative: 81 y/o ? chronic CHad2Vasc2 score= on Dabigatran, DM ty 2, GERD, Chronic Dyspnea 2/2 COPD PFT 01/20/09- Severe obstructive disease, FEV1/FVC 0.47. No response. Mild restriction. [smoker till 2008], recent admission 12/5--10/20/14 DHF with Pulmonary Htn Pa Peak 39  mmHG, Patent foramen Ovale, h/o lung nodule--discharge weight was 128 LBs down from 144Lbs on admit seen at Cardiology offie 11/14/14 and 2/2 to dietary indiscretion as well as weight gain 13 Lb ? RLE cellulitis re-admitted 81 y/o with decompensated heart failure  Hospital Course:   Acute decompensation of probable multifactorial respiratory/cardiopulomonary issues-likely cause of weight gain is to both right-sided heart failure as well as chronic diastolic heart failure-treatment with a relatively the same. It was felt that her heart failure was most likely secondary to diastolic issues rather than pulmonary hypertension She was transitioned to oral Lasix  daily on discharge after being on IV twice a day 40 mg during hospital stay  Her net negative output was 4.6 L Her weight dropped from 151-145 pounds during hospital stay   Grade 3/stage III pulmonary hypertension-PA peak pressure 39 mmHg--evidence suggests that diuresis and chronic oxygen are mainstays of treatment and I believe low-dose O2 is medically necessary on discharge to prevent her from relapsing Patient uses when necessary oxygen at home  COPD-clinic compensated and no wheeze. Does not require burst of steroids or scheduled albuterol at Present Outpatient pulmonology follow-up  Atrial fibrillation CHad2Vasc2 score>4= patient will need to continue on Pradaxa 75 every 12.  She was slightly hypotensive during admission ?  transitioned her from Cardizem 120 CD to Cardizem 30 mg every 8-=-I did switch her to Cardizem 60 xl bid on 11/17/14 and her  Diabetes mellitus well controlled with evidences of peripheral neuropathy-A1c 6.4. Caution with use of metformin 500 twice a day as diuresis. Repeat labs in the a.m. continue sliding scale coverage. Continue gabapentin 600  4 times a day Blood sugars tightly controlled between 91 and 119  Hyperlipidemia continue Pravachol 40 mg daily  Reflux disease-continue PPI  Neck pain-probably  cervical degenerative disc disease-outpatient follow-up, continue tramadol 25 twice a day when necessary pain  Possible right lower extremity cellulitis-continue Keflex by mouth 3 times a day, stop date 11/21/14  Procedures: X-rays  Consultations:  Cardiology  Discharge Exam: Filed Vitals:   11/18/14 0500  BP: 109/57  Pulse: 61  Temp: 97.5 F (36.4 C)  Resp: 20    General: EOMI NCAT Cardiovascular: S1-S2 no murmur rub or gallop Respiratory: Clinically clear no added sound Lower extremity edema is improved, erythema is improved over right shin she  Discharge Instructions   Discharge Instructions    Diet - low sodium heart healthy    Complete by:  As directed      Discharge instructions    Complete by:  As directed   Patient will need close monitoring of her fluid status and salt intake-she is to take in less than 1500 cc oral fluids and less than 2 g of salt daily She is to take an extra Lasix if she gains more than 2 pounds  of fluid weight or feels swollen and she should contact her cardiologist if this occurs She is to use oxygen on an intermittent basis She will need also close outpatient follow-up from pulmonology Please continue Keflex for 3 more days Note some changes to her medications     Increase activity slowly    Complete by:  As directed           Current Discharge Medication List    START taking these medications   Details  cephALEXin (KEFLEX) 500 MG capsule Take 1 capsule (500 mg total) by mouth every 8 (eight) hours. Qty: 6 capsule, Refills: 0    diltiazem (CARDIZEM SR) 60 MG 12 hr capsule Take 1 capsule (60 mg total) by mouth every 12 (twelve) hours. Qty: 60 capsule, Refills: 0      CONTINUE these medications which have CHANGED   Details  furosemide (LASIX) 40 MG tablet Take 1 tablet (40 mg total) by mouth 2 (two) times daily. Qty: 30 tablet, Refills: 0      CONTINUE these medications which have NOT CHANGED   Details  albuterol (PROAIR  HFA) 108 (90 BASE) MCG/ACT inhaler Inhale 2 puffs into the lungs every 4 (four) hours as needed for wheezing or shortness of breath. Qty: 8.5 g, Refills: 11    benzonatate (TESSALON PERLES) 100 MG capsule Take 1 capsule (100 mg total) by mouth 3 (three) times daily as needed for cough. Qty: 30 capsule, Refills: 1    CALCIUM CITRATE PO Take 2 tablets by mouth daily.    dabigatran (PRADAXA) 75 MG CAPS Take 1 capsule (75 mg total) by mouth every 12 (twelve) hours. Qty: 180 capsule, Refills: 1    famotidine (PEPCID) 20 MG tablet Take 1 tablet (20 mg total) by mouth daily. Qty: 30 tablet, Refills: 3    gabapentin (NEURONTIN) 600 MG tablet Take 600 mg by mouth 4 (four) times daily.    Glucosamine-Chondroit-Vit C-Mn (GLUCOSAMINE CHONDROITIN COMPLX) CAPS Take 3 capsules by mouth 2 (two) times daily.     guaiFENesin (MUCINEX) 600 MG 12 hr tablet Take 600 mg by mouth 2 (two) times daily.    levalbuterol (XOPENEX) 0.63 MG/3ML nebulizer solution Take 3 mLs (0.63 mg total) by nebulization every 4 (four) hours as needed for wheezing or shortness of breath. Qty:  300 mL, Refills: 3    metFORMIN (GLUCOPHAGE) 500 MG tablet Take 500 mg by mouth 2 (two) times daily with a meal.      Multiple Vitamins-Minerals (ABC PLUS SENIOR) TABS Take 1 tablet by mouth daily.    Omega-3 Fatty Acids (FISH OIL) 1000 MG CAPS Take 1 capsule by mouth daily.     omeprazole (PRILOSEC) 20 MG capsule Take 1 capsule by mouth daily.    potassium chloride SA (K-DUR,KLOR-CON) 20 MEQ tablet Take 20 mEq by mouth daily.    pravastatin (PRAVACHOL) 20 MG tablet Take 20 mg by mouth daily.    ranitidine (ZANTAC) 150 MG tablet Take 150 mg by mouth daily.    traMADol (ULTRAM) 50 MG tablet Take 25 mg by mouth 2 (two) times daily as needed (pain).    Respiratory Therapy Supplies (FLUTTER) DEVI Blow through 4 times, and do this 3 times daily when needed Qty: 1 each, Refills: 0   Associated Diagnoses: Chronic airway obstruction, not  elsewhere classified; Other diseases of lung, not elsewhere classified      STOP taking these medications     diltiazem (CARDIZEM CD) 120 MG 24 hr capsule      predniSONE (DELTASONE) 2.5 MG tablet      ibuprofen (ADVIL,MOTRIN) 200 MG tablet      predniSONE (DELTASONE) 10 MG tablet        No Known Allergies Follow-up Information    Follow up with Joni ReiningKathryn Lawrence, NP On 12/01/2014.   Specialty:  Nurse Practitioner   Why:  1:10 Needs BMET in one week.    Contact information:   [address] [place] [id number] [phone]        The results of significant diagnostics from this hospitalization (including imaging, microbiology, ancillary and laboratory) are listed below for reference.    Significant Diagnostic Studies: Dg Chest 2 View  11/15/2014   CLINICAL DATA:  Bilateral lower extremity swelling x 3-4 days, mild shortness of breath  EXAM: CHEST  2 VIEW  COMPARISON:  10/20/2014  FINDINGS: Mild patchy bilateral lower lobe opacities, likely atelectasis, pneumonia not excluded.  No frank interstitial edema. Small bilateral pleural effusions. No pneumothorax.  The heart is normal in size.  Degenerative changes of the visualized thoracolumbar spine.  Cholecystectomy clips.  IMPRESSION: Mild patchy bilateral lower lobe opacities, likely atelectasis, pneumonia not excluded.  No frank interstitial edema.  Small bilateral pleural effusions.   Electronically Signed   By: Charline BillsSriyesh  Krishnan M.D.   On: 11/15/2014 17:27   Dg Chest 2 View  10/20/2014   EXAM: CHEST  2 VIEW  COMPARISON:  None.  FINDINGS: The heart size and mediastinal contours are within normal limits. Both lungs are clear. The visualized skeletal structures are unremarkable.  IMPRESSION: No active cardiopulmonary disease.   Electronically Signed   By: Alcide CleverMark  Lukens M.D.   On: 10/20/2014 14:54    Microbiology: No results found for this or any previous visit (from the past 240 hour(s)).   Labs: Basic Metabolic Panel:  Recent  Labs Lab 11/15/14 1759 11/16/14 0024 11/17/14 0531 11/18/14 0650  NA 140 137 138 137  K 3.6 3.5 3.6 3.3*  CL 98 98 99 96  CO2 33* 31 33* 33*  GLUCOSE 114* 84 84 86  BUN 13 12 15 16   CREATININE 1.15* 1.06 1.03 1.01  CALCIUM 9.4 8.6 8.4 8.3*  MG  --   --  1.6  --    Liver Function Tests:  Recent Labs Lab 11/15/14  1759 11/17/14 0531 11/18/14 0650  AST ALT ALKPHOS 86 70 72  BILITOT 0.5 0.5 0.7  PROT 7.1 5.6* 5.7*  ALBUMIN 3.9 3.1* 3.2*   No results for input(s): LIPASE, AMYLASE in the last 168 hours. No results for input(s): AMMONIA in the last 168 hours. CBC:  Recent Labs Lab 11/15/14 1759 11/17/14 0531 11/18/14 0650  WBC 7.6 6.6 6.6  NEUTROABS 5.5  --   --   HGB 11.5* 10.7* 11.1*  HCT 35.5* 32.7* 34.4*  MCV 95.2 95.3 95.3  PLT 229 195 204   Cardiac Enzymes: No results for input(s): CKTOTAL, CKMB, CKMBINDEX, TROPONINI in the last 168 hours. BNP: BNP (last 3 results)  Recent Labs  04/28/14 0155 10/15/14 2217  PROBNP 1678.0* 2147.0*   CBG:  Recent Labs Lab 11/17/14 0716 11/17/14 1125 11/17/14 1618 11/17/14 2118 11/18/14 0747  GLUCAP 91 134* 114* 119* 94       Signed:  Rhetta Mura  [facility] 11/18/2014, 9:44 AM

## 2014-11-18 NOTE — Progress Notes (Signed)
Consulting cardiologist: Dina Rich MD Primary Cardiologist: Prentice Docker MD  Cardiology Specific Problem List: 1. Acute on  Chronic Diastolic CHF 2. Atrial fibrillation  Subjective:    She is feeling better. Son at bedside with multiple questions.   Objective:   Temp:  [97.5 F (36.4 C)-98.7 F (37.1 C)] 97.5 F (36.4 C) (01/08 0500) Pulse Rate:  [61-80] 61 (01/08 0500) Resp:  [20] 20 (01/08 0500) BP: (100-114)/(45-58) 109/57 mmHg (01/08 0500) SpO2:  [96 %-98 %] 98 % (01/08 0500) Weight:  [145 lb (65.772 kg)] 145 lb (65.772 kg) (01/08 0500) Last BM Date: 11/17/14  Filed Weights   11/16/14 0538 11/17/14 0500 11/18/14 0500  Weight: 153 lb 3.2 oz (69.491 kg) 149 lb 8 oz (67.813 kg) 145 lb (65.772 kg)    Intake/Output Summary (Last 24 hours) at 11/18/14 0854 Last data filed at 11/18/14 1610  Gross per 24 hour  Intake   1180 ml  Output    800 ml  Net    380 ml    Telemetry: Atrial fib rates in the 60's and 70's.   Exam:  General: No acute distress.  HEENT: Conjunctiva and lids normal, oropharynx clear.  Lungs: Clear to auscultation, nonlabored.  Cardiac: No elevated JVP or bruits. IRRR, no gallop or rub.   Abdomen: Normoactive bowel sounds, nontender, nondistended.  Extremities: No pitting edema, distal pulses full. Improved erythema of the pretibal area on the right, and left. She has some venous statis skin thickening.   Neuropsychiatric: Alert and oriented x3, affect appropriate.   Lab Results:  Basic Metabolic Panel:  Recent Labs Lab 11/16/14 0024 11/17/14 0531 11/18/14 0650  NA 137 138 137  K 3.5 3.6 3.3*  CL 98 99 96  CO2 31 33* 33*  GLUCOSE 84 84 86  BUN CREATININE 1.06 1.03 1.01  CALCIUM 8.6 8.4 8.3*  MG  --  1.6  --     Liver Function Tests:  Recent Labs Lab 11/15/14 1759 11/17/14 0531 11/18/14 0650  AST ALT ALKPHOS 86 70 72  BILITOT 0.5 0.5 0.7  PROT 7.1 5.6* 5.7*  ALBUMIN 3.9  3.1* 3.2*    CBC:  Recent Labs Lab 11/15/14 1759 11/17/14 0531 11/18/14 0650  WBC 7.6 6.6 6.6  HGB 11.5* 10.7* 11.1*  HCT 35.5* 32.7* 34.4*  MCV 95.2 95.3 95.3  PLT 229 195 204    BNP:  Recent Labs  04/28/14 0155 10/15/14 2217  PROBNP 1678.0* 2147.0*    Medications:   Scheduled Medications: . cephALEXin  500 mg Oral 3 times per day  . Chlorhexidine Gluconate Cloth  6 each Topical Q0600  . dabigatran  75 mg Oral Q12H  . diltiazem  60 mg Oral Q12H  . gabapentin  600 mg Oral 4 times per day  . insulin aspart  0-15 Units Subcutaneous TID WC  . insulin aspart  0-5 Units Subcutaneous QHS  . metFORMIN  500 mg Oral BID WC  . mupirocin ointment  1 application Nasal BID  . pantoprazole  40 mg Oral Daily  . potassium chloride  20 mEq Oral BID  . pravastatin  40 mg Oral q1800  . predniSONE  2.5 mg Oral Q breakfast  . sodium chloride  3 mL Intravenous Q12H       PRN Medications: albuterol, benzonatate, traMADol   Assessment and Plan:   1.Acute on Chronic Diastolic CHF: Improved. Has diuresed 4 liters since admission. Wt  at 145 lbs (admisision wt 151 lbs). Creatinine is stable, CO2 33. Mildly decreased potassium. She is not at dry wt from discharge in December at 138 lbs. Will discuss with Dr. Wyline MoodBranch need to continue IV lasix for another 24 hours as she continues to diurese 2 liters a day. May wait to see if she trends down on urine output first. Fluid restriction and low sodium diet is still recommended. Has been seen by dietician.   2. Atrial fib: Heart rate is controlled. Continue pradaxa, and diltiazem.   3. Hypertension: BP low normal She is sedentary. Will not make changes at this time.     Bettey MareKathryn M. Lawrence NP AACC  11/18/2014, 8:54 AM   Patient seen and discussed with NP Lyman BishopLawrence, I agree with her documentation above. Regarding her acute on chronic diastolic HF she is negative 4.6 liters since admission, renal function remains stable. Weight is down 6 lbs.  Will change to lasix 40mg  po bid. Low K this AM, defer to primary team for electrolyte replacement. She will need close f/u within 2 weeks in our clinic with NP Lyman BishopLawrence along with a BMET.   Dominga FerryJ Branch MD

## 2014-11-18 NOTE — Progress Notes (Signed)
Follow-up Diet edu consult  Pt provided low sodium diet education (see note on 1/6). Her son is here today and we reviewed recommendations and provided additional handouts (Heart Failure Nutrition Therapy and Sodium Content of Foods). As noted previously main barrier for pt is the fact someone else will be preparing her food daily.  Fluid restriction reviewed by nursing. Family says pt  "doen't drink much during the day".  Royann ShiversLynn Ling Flesch MS,RD,CSG,LDN Office: [phone] Pager: [phone]

## 2014-11-28 ENCOUNTER — Telehealth: Payer: Self-pay | Admitting: *Deleted

## 2014-11-28 NOTE — Telephone Encounter (Signed)
Give extra dose of lasix 40 mg today with extra dose of potassium with this. AVOID salty foods., Has had a problem with eating salty foods at home. If no response call us back.

## 2014-11-28 NOTE — Telephone Encounter (Signed)
Faxed order to pt's assisted living Attn:Jessica

## 2014-11-28 NOTE — Telephone Encounter (Signed)
Will forward to K Lawrence NP 

## 2014-11-28 NOTE — Telephone Encounter (Signed)
Pt is still retaining fluid. She was 142.4 on 16th and 143 on 17th and today she is 145.2lbs

## 2014-12-01 ENCOUNTER — Encounter: Payer: Self-pay | Admitting: Adult Health

## 2014-12-01 ENCOUNTER — Ambulatory Visit (INDEPENDENT_AMBULATORY_CARE_PROVIDER_SITE_OTHER): Payer: Medicare Other | Admitting: Adult Health

## 2014-12-01 VITALS — BP 130/70 | HR 78 | Ht 60.0 in | Wt 147.4 lb

## 2014-12-01 DIAGNOSIS — I10 Essential (primary) hypertension: Secondary | ICD-10-CM

## 2014-12-01 DIAGNOSIS — I4891 Unspecified atrial fibrillation: Secondary | ICD-10-CM

## 2014-12-01 DIAGNOSIS — I5033 Acute on chronic diastolic (congestive) heart failure: Secondary | ICD-10-CM

## 2014-12-01 NOTE — Progress Notes (Signed)
HPI: Carrie Heath is a 81 year old female patient of Dr. Purvis SheffieldKoneswaran, that we are following for ongoing assessment and management of permanent atrial fibrillation, on anticoagulation with dabigatran, CHADS VASC Score of 5, where following after her recent hospitalization in the setting of atrial fibrillation with RVR, and CHF.  Echocardiogram revealed grade 3 pulmonary hypertension, with a peak PA pressure of 39 mm mercury.  She had a normal ejection fraction.  She was diureses 4.6 L.  Discharge weight 145 pounds.   She comes today without any complaints. She is adhering to low sodium diet. Wt is maintained.   No Known Allergies  Current Outpatient Prescriptions  Medication Sig Dispense Refill  . albuterol (PROAIR HFA) 108 (90 BASE) MCG/ACT inhaler Inhale 2 puffs into the lungs every 4 (four) hours as needed for wheezing or shortness of breath. 8.5 g 11  . benzonatate (TESSALON PERLES) 100 MG capsule Take 1 capsule (100 mg total) by mouth 3 (three) times daily as needed for cough. 30 capsule 1  . CALCIUM CITRATE PO Take 2 tablets by mouth daily.    . dabigatran (PRADAXA) 75 MG CAPS Take 1 capsule (75 mg total) by mouth every 12 (twelve) hours. 180 capsule 1  . diltiazem (CARDIZEM SR) 60 MG 12 hr capsule Take 1 capsule (60 mg total) by mouth every 12 (twelve) hours. 60 capsule 0  . famotidine (PEPCID) 20 MG tablet Take 1 tablet (20 mg total) by mouth daily. 30 tablet 3  . furosemide (LASIX) 40 MG tablet Take 1 tablet (40 mg total) by mouth 2 (two) times daily. 30 tablet 0  . gabapentin (NEURONTIN) 600 MG tablet Take 600 mg by mouth 4 (four) times daily.    . Glucosamine-Chondroit-Vit C-Mn (GLUCOSAMINE CHONDROITIN COMPLX) CAPS Take 3 capsules by mouth 2 (two) times daily.     Marland Kitchen. guaiFENesin (MUCINEX) 600 MG 12 hr tablet Take 600 mg by mouth 2 (two) times daily.    Marland Kitchen. levalbuterol (XOPENEX) 0.63 MG/3ML nebulizer solution Take 3 mLs (0.63 mg total) by nebulization every 4 (four) hours as needed for  wheezing or shortness of breath. 300 mL 3  . metFORMIN (GLUCOPHAGE) 500 MG tablet Take 500 mg by mouth 2 (two) times daily with a meal.      . Multiple Vitamins-Minerals (ABC PLUS SENIOR) TABS Take 1 tablet by mouth daily.    . Omega-3 Fatty Acids (FISH OIL) 1000 MG CAPS Take 1 capsule by mouth daily.     Marland Kitchen. omeprazole (PRILOSEC) 20 MG capsule Take 1 capsule by mouth daily.    . potassium chloride SA (K-DUR,KLOR-CON) 20 MEQ tablet Take 20 mEq by mouth daily.    . pravastatin (PRAVACHOL) 20 MG tablet Take 20 mg by mouth daily.    . ranitidine (ZANTAC) 150 MG tablet Take 150 mg by mouth daily.    Marland Kitchen. Respiratory Therapy Supplies (FLUTTER) DEVI Blow through 4 times, and do this 3 times daily when needed 1 each 0  . traMADol (ULTRAM) 50 MG tablet Take 25 mg by mouth 2 (two) times daily as needed (pain).     No current facility-administered medications for this visit.    Past Medical History  Diagnosis Date  . DM type 2 (diabetes mellitus, type 2)   . Bronchitis   . COPD (chronic obstructive pulmonary disease)   . Hyperlipidemia   . HTN (hypertension)   . Neuropathy   . Chronic a-fib   . Diabetes mellitus with neuropathy   . High cholesterol  Past Surgical History  Procedure Laterality Date  . Thyroidectomy, partial      remote  . Tonsillectomy    . Appendectomy    . Cholecystectomy    . Total abdominal hysterectomy    . Bladder surgery      ROS: Complete review of systems performed and found to be negative unless outlined above  PHYSICAL EXAM BP 130/70 mmHg  Pulse 78  Ht 5' (1.524 m)  Wt 147 lb 6.4 oz (66.86 kg)  BMI 28.79 kg/m2  SpO2 95%  General: Well developed, well nourished, in no acute distress.Sitting in a wheelchair. Head: Eyes PERRLA, No xanthomas.   Normal cephalic and atramatic  Lungs: Clear bilaterally to auscultation and percussion. Heart: HRIR S1 S2, without MRG.  Pulses are 2+ & equal.            No carotid bruit. No JVD.  No abdominal bruits. No femoral  bruits. Abdomen: Bowel sounds are positive, abdomen soft and non-tender without masses or                  Hernia's noted. Msk:  Back normal, using walker for ambulaiton. Normal strength and tone for age. Extremities: No clubbing, cyanosis or edema.  DP +1 Neuro: Alert and oriented X 3. Psych:  Good affect, responds appropriately   ASSESSMENT AND PLAN

## 2014-12-01 NOTE — Assessment & Plan Note (Signed)
She is very well compensated. No overt evidence of fluid retention. She is maintaining her weight. I have encouraged her in her diet restrictions. I will repeat her BMET to assess kidney function and potassium status. See her in 3 months.

## 2014-12-01 NOTE — Progress Notes (Signed)
Name: Carrie Heath    DOB: Sep 04, 1921  Age: 81 y.o.  MR#: [id number]       PCP:  Benita StabileHALL,[name]      Insurance: Payor: MEDICARE / Plan: MEDICARE PART A AND B / Product Type: *No Product type* /   CC:    Chief Complaint  Patient presents with  . Atrial Fibrillation    VS Filed Vitals:   12/01/14 1308  BP: 130/70  Pulse: 78  Height: 5' (1.524 m)  Weight: 147 lb 6.4 oz (66.86 kg)  SpO2: 95%    Weights Current Weight  12/01/14 147 lb 6.4 oz (66.86 kg)  11/18/14 145 lb (65.772 kg)  11/15/14 148 lb 12.8 oz (67.495 kg)    Blood Pressure  BP Readings from Last 3 Encounters:  12/01/14 130/70  11/18/14 119/54  11/15/14 114/68     Admit date:  (Not on file) Last encounter with RMR:  11/15/2014   Allergy Review of patient's allergies indicates no known allergies.  Current Outpatient Prescriptions  Medication Sig Dispense Refill  . albuterol (PROAIR HFA) 108 (90 BASE) MCG/ACT inhaler Inhale 2 puffs into the lungs every 4 (four) hours as needed for wheezing or shortness of breath. 8.5 g 11  . benzonatate (TESSALON PERLES) 100 MG capsule Take 1 capsule (100 mg total) by mouth 3 (three) times daily as needed for cough. 30 capsule 1  . CALCIUM CITRATE PO Take 2 tablets by mouth daily.    . dabigatran (PRADAXA) 75 MG CAPS Take 1 capsule (75 mg total) by mouth every 12 (twelve) hours. 180 capsule 1  . diltiazem (CARDIZEM SR) 60 MG 12 hr capsule Take 1 capsule (60 mg total) by mouth every 12 (twelve) hours. 60 capsule 0  . famotidine (PEPCID) 20 MG tablet Take 1 tablet (20 mg total) by mouth daily. 30 tablet 3  . furosemide (LASIX) 40 MG tablet Take 1 tablet (40 mg total) by mouth 2 (two) times daily. 30 tablet 0  . gabapentin (NEURONTIN) 600 MG tablet Take 600 mg by mouth 4 (four) times daily.    . Glucosamine-Chondroit-Vit C-Mn (GLUCOSAMINE CHONDROITIN COMPLX) CAPS Take 3 capsules by mouth 2 (two) times daily.     Marland Kitchen. guaiFENesin (MUCINEX) 600 MG 12 hr tablet Take 600 mg by mouth 2 (two)  times daily.    Marland Kitchen. levalbuterol (XOPENEX) 0.63 MG/3ML nebulizer solution Take 3 mLs (0.63 mg total) by nebulization every 4 (four) hours as needed for wheezing or shortness of breath. 300 mL 3  . metFORMIN (GLUCOPHAGE) 500 MG tablet Take 500 mg by mouth 2 (two) times daily with a meal.      . Multiple Vitamins-Minerals (ABC PLUS SENIOR) TABS Take 1 tablet by mouth daily.    . Omega-3 Fatty Acids (FISH OIL) 1000 MG CAPS Take 1 capsule by mouth daily.     Marland Kitchen. omeprazole (PRILOSEC) 20 MG capsule Take 1 capsule by mouth daily.    . potassium chloride SA (K-DUR,KLOR-CON) 20 MEQ tablet Take 20 mEq by mouth daily.    . pravastatin (PRAVACHOL) 20 MG tablet Take 20 mg by mouth daily.    . ranitidine (ZANTAC) 150 MG tablet Take 150 mg by mouth daily.    Marland Kitchen. Respiratory Therapy Supplies (FLUTTER) DEVI Blow through 4 times, and do this 3 times daily when needed 1 each 0  . traMADol (ULTRAM) 50 MG tablet Take 25 mg by mouth 2 (two) times daily as needed (pain).     No current facility-administered medications for  this visit.    Discontinued Meds:    Medications Discontinued During This Encounter  Medication Reason  . cephALEXin (KEFLEX) 500 MG capsule Error    Patient Active Problem List   Diagnosis Date Noted  . Accelerated hypertension 11/15/2014  . Acute on chronic diastolic congestive heart failure 11/15/2014  . Atrial fibrillation with RVR 10/15/2014  . Acute on chronic diastolic CHF (congestive heart failure) 10/15/2014  . Acute bronchitis with chronic obstructive pulmonary disease (COPD) 10/15/2014  . SOB (shortness of breath) 10/15/2014  . Atrial fibrillation with controlled ventricular response 02/17/2012  . Hip pain, bilateral 04/25/2011  . Spinal stenosis 04/25/2011  . Weakness of both legs 04/25/2011  . COPD exacerbation 08/18/2009  . KNEE, ARTHRITIS, DEGEN./OSTEO 05/01/2009  . KNEE PAIN 05/01/2009  . LUNG NODULE 02/15/2009  . DM type 2 with diabetic peripheral neuropathy 01/13/2009   . HYPERLIPIDEMIA 01/13/2009  . Asthma with bronchitis 01/13/2009    LABS    Component Value Date/Time   NA 137 11/18/2014 0650   NA 138 11/17/2014 0531   NA 137 11/16/2014 0024   K 3.3* 11/18/2014 0650   K 3.6 11/17/2014 0531   K 3.5 11/16/2014 0024   CL 96 11/18/2014 0650   CL 99 11/17/2014 0531   CL 98 11/16/2014 0024   CO2 33* 11/18/2014 0650   CO2 33* 11/17/2014 0531   CO2 31 11/16/2014 0024   GLUCOSE 86 11/18/2014 0650   GLUCOSE 84 11/17/2014 0531   GLUCOSE 84 11/16/2014 0024   BUN 16 11/18/2014 0650   BUN 15 11/17/2014 0531   BUN 12 11/16/2014 0024   CREATININE 1.01 11/18/2014 0650   CREATININE 1.03 11/17/2014 0531   CREATININE 1.06 11/16/2014 0024   CALCIUM 8.3* 11/18/2014 0650   CALCIUM 8.4 11/17/2014 0531   CALCIUM 8.6 11/16/2014 0024   GFRNONAA 47* 11/18/2014 0650   GFRNONAA 46* 11/17/2014 0531   GFRNONAA 44* 11/16/2014 0024   GFRAA 55* 11/18/2014 0650   GFRAA 53* 11/17/2014 0531   GFRAA 52* 11/16/2014 0024   CMP     Component Value Date/Time   NA 137 11/18/2014 0650   K 3.3* 11/18/2014 0650   CL 96 11/18/2014 0650   CO2 33* 11/18/2014 0650   GLUCOSE 86 11/18/2014 0650   BUN 16 11/18/2014 0650   CREATININE 1.01 11/18/2014 0650   CALCIUM 8.3* 11/18/2014 0650   PROT 5.7* 11/18/2014 0650   ALBUMIN 3.2* 11/18/2014 0650   AST 16 11/18/2014 0650   ALT 11 11/18/2014 0650   ALKPHOS 72 11/18/2014 0650   BILITOT 0.7 11/18/2014 0650   GFRNONAA 47* 11/18/2014 0650   GFRAA 55* 11/18/2014 0650       Component Value Date/Time   WBC 6.6 11/18/2014 0650   WBC 6.6 11/17/2014 0531   WBC 7.6 11/15/2014 1759   HGB 11.1* 11/18/2014 0650   HGB 10.7* 11/17/2014 0531   HGB 11.5* 11/15/2014 1759   HCT 34.4* 11/18/2014 0650   HCT 32.7* 11/17/2014 0531   HCT 35.5* 11/15/2014 1759   MCV 95.3 11/18/2014 0650   MCV 95.3 11/17/2014 0531   MCV 95.2 11/15/2014 1759    Lipid Panel  No results found for: CHOL, TRIG, HDL, CHOLHDL, VLDL, LDLCALC, LDLDIRECT  ABG No  results found for: PHART, PCO2ART, PO2ART, HCO3, TCO2, ACIDBASEDEF, O2SAT   Lab Results  Component Value Date   TSH 1.580 10/16/2014   BNP (last 3 results)  Recent Labs  04/28/14 0155 10/15/14 2217  PROBNP 1678.0* 2147.0*   Cardiac  Panel (last 3 results) No results for input(s): CKTOTAL, CKMB, TROPONINI, RELINDX in the last 72 hours.  Iron/TIBC/Ferritin/ %Sat No results found for: IRON, TIBC, FERRITIN, IRONPCTSAT   EKG Orders placed or performed during the hospital encounter of 11/15/14  . EKG 12-Lead  . EKG 12-Lead  . EKG 12-Lead  . EKG 12-Lead     Prior Assessment and Plan Problem List as of 12/01/2014      Cardiovascular and Mediastinum   Atrial fibrillation with controlled ventricular response   Last Assessment & Plan 04/25/2014 Office Visit Written 06/26/2014  7:36 PM by Waymon Budge, MD    Managed by cardiology. Watch for fluid overload      Atrial fibrillation with RVR   Acute on chronic diastolic CHF (congestive heart failure)   Accelerated hypertension   Acute on chronic diastolic congestive heart failure   Last Assessment & Plan 11/01/2014 Office Visit Written 11/16/2014  8:29 AM by Julio Sicks, NP    Recent flare with hospital admission -improved with diureiss  Cont with follow up with cards         Respiratory   Asthma with bronchitis   Last Assessment & Plan 04/25/2014 Office Visit Written 06/26/2014  7:35 PM by Waymon Budge, MD    Well controlled, without complication at this time, but few crackles and ankle edema indicate fluid overload possibly from heart Plan-chest x-ray      COPD exacerbation   Last Assessment & Plan 03/14/2014 Office Visit Written 03/14/2014  9:23 PM by Waymon Budge, MD    Severe COPD, advancing age and increasing difficulty managing her own affairs. I have tried to reconcile medications between our list and the Assisted Living facility list. She should only use an extended action nebulizer solution twice or at most 3 times  a day. We have her listed with perforomist, although the son refers to brovana. Rescue inhaler up to 4 times daily if needed. Okay to continue low-dose prednisone for stability and probably some adrenal support.      LUNG NODULE   Last Assessment & Plan 09/13/2013 Office Visit Edited 09/27/2013  8:30 PM by Waymon Budge, MD    Plan- update CXR- discussed      Acute bronchitis with chronic obstructive pulmonary disease (COPD)   Last Assessment & Plan 11/01/2014 Office Visit Written 11/16/2014  8:28 AM by Julio Sicks, NP    Resolving flare -  Plan  Take Jerilynn Som Three times a day  As needed  Cough.  Use Xopenex Neb every 4 hrs as needed for wheezing.  Taper Prednisone to 2.5mg  daily  Follow up Dr. Maple Hudson  In 6 weeks and As needed            Nervous and Auditory   DM type 2 with diabetic peripheral neuropathy   Weakness of both legs     Musculoskeletal and Integument   KNEE, ARTHRITIS, DEGEN./OSTEO     Other   HYPERLIPIDEMIA   KNEE PAIN   Hip pain, bilateral   Spinal stenosis   SOB (shortness of breath)       Imaging: Dg Chest 2 View  11/15/2014   CLINICAL DATA:  Bilateral lower extremity swelling x 3-4 days, mild shortness of breath  EXAM: CHEST  2 VIEW  COMPARISON:  10/20/2014  FINDINGS: Mild patchy bilateral lower lobe opacities, likely atelectasis, pneumonia not excluded.  No frank interstitial edema. Small bilateral pleural effusions. No pneumothorax.  The heart is normal in size.  Degenerative changes of the visualized thoracolumbar spine.  Cholecystectomy clips.  IMPRESSION: Mild patchy bilateral lower lobe opacities, likely atelectasis, pneumonia not excluded.  No frank interstitial edema.  Small bilateral pleural effusions.   Electronically Signed   By: Charline Bills M.D.   On: 11/15/2014 17:27

## 2014-12-01 NOTE — Assessment & Plan Note (Signed)
Heart rate is well controlled. No evidence of bleeding issues on dabigatran. She will continue ditiazem for rate control.

## 2014-12-01 NOTE — Patient Instructions (Signed)
Your physician recommends that you schedule a follow-up appointment in: 3 months with Joni ReiningKathryn Lawrence, NP.  Your physician recommends that you continue on your current medications as directed. Please refer to the Current Medication list given to you today.  Your physician recommends that you return for lab work in: [employer], [employer]ellery(BMET)  Thank you for choosing [facility]!

## 2014-12-05 ENCOUNTER — Telehealth: Payer: Self-pay | Admitting: Adult Health

## 2014-12-05 NOTE — Telephone Encounter (Signed)
I didn't see wt parameters,this (145 lbs) was her discharge wt from hospital,can you clarify

## 2014-12-05 NOTE — Telephone Encounter (Signed)
Keeping wt 145 lbs or below is goal. This is best obtained by her not using salt provided on the table at SNF and continuing her medications.

## 2014-12-05 NOTE — Telephone Encounter (Signed)
Faxed to SNF wt parameters of 136-145 lbs [facility] [phone]

## 2014-12-05 NOTE — Telephone Encounter (Signed)
Calling to report weight of 145 today / parameters are 136-144 / tgs

## 2014-12-07 ENCOUNTER — Encounter: Payer: Self-pay | Admitting: Adult Health

## 2014-12-08 ENCOUNTER — Telehealth: Payer: Self-pay | Admitting: *Deleted

## 2014-12-08 NOTE — Telephone Encounter (Signed)
Please have her take an additional 20 mg of lasix today (half of a tablet of the 40 she usually takes is ok). Give additional 1/2 tablet of potassium as well.

## 2014-12-08 NOTE — Telephone Encounter (Signed)
Home health nurse called and  Reports today's weight is 146.5 lb

## 2014-12-08 NOTE — Telephone Encounter (Signed)
Spoke with [place] from [facility]. Relayed messsage from Joni ReiningKathryn Lawrence, NP. Nurse voiced understanding.

## 2014-12-09 ENCOUNTER — Telehealth: Payer: Self-pay | Admitting: *Deleted

## 2014-12-09 NOTE — Telephone Encounter (Signed)
Continue usual dose of lasix for now. Call if she gains again. May need to increase maintenance dose.

## 2014-12-09 NOTE — Telephone Encounter (Signed)
Relayed message from K.Lawrence NP to [place], med tech .She will call us on Monday 12/12/14 with update of pt's weight

## 2014-12-09 NOTE — Telephone Encounter (Signed)
Will forward to Ms.Lawrence NP for [employer]

## 2014-12-09 NOTE — Telephone Encounter (Signed)
Pt weight 146.5 12/08/14 and today 146.5 today even with extra lasix and k yesterday/tmj

## 2014-12-12 ENCOUNTER — Telehealth: Payer: Self-pay | Admitting: *Deleted

## 2014-12-12 NOTE — Telephone Encounter (Signed)
PT WEIGHT YESTERDAY WAS 144.4 TODAY 146.2. PT IS REFUSING TO FOLLOW HER FLUID RESTRICTION DIET

## 2014-12-12 NOTE — Telephone Encounter (Signed)
K.Lawrence NP not in office today.Will send to her cardiologist Dr.Koneswaran.

## 2014-12-12 NOTE — Telephone Encounter (Signed)
Have her take 80 mg q am and 40 mg q pm x next 3 days, then back to 40 mg bid.

## 2014-12-12 NOTE — Telephone Encounter (Signed)
Spoke with [facility] and gave her instructions regarding lasix.She reiterated patient is now totally non complaint with fluid restriction

## 2014-12-13 ENCOUNTER — Telehealth: Payer: Self-pay | Admitting: Internal Medicine

## 2014-12-13 ENCOUNTER — Encounter: Payer: Self-pay | Admitting: Internal Medicine

## 2014-12-13 ENCOUNTER — Ambulatory Visit: Payer: BC Managed Care – PPO | Admitting: Pulmonary Disease

## 2014-12-13 ENCOUNTER — Ambulatory Visit (INDEPENDENT_AMBULATORY_CARE_PROVIDER_SITE_OTHER): Payer: Medicare Other | Admitting: Internal Medicine

## 2014-12-13 VITALS — BP 132/70 | HR 84 | Ht 62.0 in | Wt 143.8 lb

## 2014-12-13 DIAGNOSIS — J4540 Moderate persistent asthma, uncomplicated: Secondary | ICD-10-CM

## 2014-12-13 DIAGNOSIS — R609 Edema, unspecified: Secondary | ICD-10-CM

## 2014-12-13 NOTE — Telephone Encounter (Signed)
Called [facility] and wad advised they would have to call us back. They are not able to reach who called us. Will await

## 2014-12-13 NOTE — Assessment & Plan Note (Signed)
Known systolic and diastolic congestive heart failure. Peripheral edema will be mostly cardiogenic, magnified by cor pulmonale. Her calcium channel blocker may be another factor. She now has weeping edema. Plan-increase Lasix to 80 mg a.m./40 mg PM 3 days, then 40 mg twice daily 3 days then 40 mg daily. Elevate legs when sitting. Try to wear elastic hose. Recheck basic chemistry and blood pressure with attention to electrolytes and renal function when she next visits primary physician.

## 2014-12-13 NOTE — Progress Notes (Signed)
Patient ID: Carrie Heath, female    DOB: December 16, 1920, 81 y.o.   MRN: [id number]  HPI 04/02/11- [age] yoF followed for COPD and lung nodule, complicated by DM Last here December 05, 2010- note reviewed.  Here with son today, she complains mostly about arthritis pains. She hasn't needed prednisone in past week. Her breathing hasn't missed it. Hasn't needed O2 in a month or two from [facility]. Not needing her nebulizer machine.   07/30/11- 89 yoF followed for COPD and lung nodule, complicated by DM    Daughter here Fairly stable. Children encourage her to continue her Rosalyn GessBrovana and she takes 1/2 of a 1 mg prednisone tab. Tried off this summer and remembers restarting it because of cough and wheeze. She is vague about whether she has prednisone 1 vs 5 mg pills.  Still notes some wheeze.  CXR 05/30/2010- stable mild COPD, NAD, no nodules seen  01/27/12- 89 yoF followed for COPD and lung nodule, complicated by DM   Son here FOLLOWS FOR: reports still having some SOB occasionally, chest congestion and prod cough with foamy clear/cream colored mucus - but reports has not used the oxygen at home.  not using brovana daily Doesn't hear herself wheeze. Uses her nebulizer with Brovana about once a day. Uses pro air off-and-on, 0 to twice daily. Room air cleaner. Turned her oxygen back in because she wasn't using it. Daily prednisone now at 3 mg. Mostly notices exertional dyspnea when up for ADLs like bathroom. Wants print prescriptions for her medications, planning 4[place]6 week trip to [place].  09/14/12- 89 yoF followed for COPD and lung nodule, complicated by DM, AFib                  Son here SOB and wheezing at times; cough mostly when congested and before/after Brovana tx. Has had flu vaccine. Has seen Dr. Daleen SquibbWall for management of atrial fibrillation. Continues prednisone 2.5 mg daily for COPD. Without that, she gets wheeze and chest congestion. Using neb brovana once or twice daily with rare need for  rescue inhaler. No longer has oxygen at home. COPD assessment test (CAT) score 21/40  03/14/13- 89 yoF followed for COPD and lung nodule, complicated by DM, AFib                  Son here FOLLOWS FOR: not able to get brovana neb rx; will need ALL rx's refilled today as she is going out of state.  Son is here Rosalyn GessBrovana is too expensive. We discussed her medications and especially her nebulizer meds. She is going to [place] again this summer. Knees limit her walking. Little cough or phlegm now. CXR 01/28/12 IMPRESSION:  Chronic bronchitic changes.  Original Report Authenticated By: Lollie MarrowMARK A. BOLES, M.D.  09/13/13- 89 yoF followed for COPD and lung nodule, complicated by DM, AFib                  DIL here FOLLOWS FOR: pt states she still has to use rescue inhaler sometimes(BID); has good and bad days. Cough at night that sounds wet.  Cough and wheeze w/ weather change. Walks at home w/ walker and cane. Daily prednisone 1/2 x 5  Mg Using neb ipratropium 2-3 x/ day. Not using Perforomist or Brovana- too expensive.  Son needs FMLA  03/14/14- 89 yoF followed for COPD and lung nodule, complicated by DM, AFib                 Son here  FOLLOWS FOR:  Pt now [facility] in [place].  Reports increased sob, cough with yellow mucus, rattling and wheezing 2-3 weeks.  Discuss pred and other meds today She is now living in [facility] in [place]. She manages her own inhalers and nebulizer. She had been off of maintenance prednisone and they recently restarted at 1 mg daily. G.radually increasing chest congestion and some yellow sputum. CXR 09/14/13 IMPRESSION:  Minimal atelectasis at the lung bases laterally. No visible  pulmonary nodules.  Electronically Signed  By: Geanie Cooley M.D.  On: 09/13/2013 14:39  04/25/14- 89 yoF followed for COPD and lung nodule, complicated by DM, AFib                 Son here FOLLOWS FOR: pt states her breathing is stable.  Denies any cough/ congestion, sob, wheezing.    Now in assisted living and doing very well. Off prednisone. Occasional rescue inhaler. Not needing her nebulizer.  11/01/14 Post Hospital follow up  Was recently admitted 12/5 for acute CHF , afib with RVR and AB exacerbation. tx for rate control with diltiazem. Had aggressive diuresis w/ improvement .  tx w/ iv abx and steroids. Along with oxygen. D/c on O2. , pred taper and Zpak.  She remains very weak. Cough is some better.  No fever , chest pain, increased leg swelling.  Caregiver present with pt along with telephone conference with pts son.   12/13/14-  89 yoF followed for COPD and lung nodule, complicated by DM, AFib                 Aide from nursing home here ACUTE VISIT: breathing is doing well; continues to sleep with O2. Pt is having increased swelling in legs and weeping as well.  Post Hosp 1/5-11/18/14   DM2, AFib, CHF, HBP  discharge weight 145 pounds She complains of weeping fluid from edematous legs especially since yesterday. Edema worse since leaving the hospital. Lasix had been increased from 40 mg daily to 40 mg twice daily. She has elastic hose, too tight.  Review of Systems-see HPI Constitutional:   No weight loss, night sweats,  Fevers, chills, +fatigue, lassitude. HEENT:   No headaches,  Difficulty swallowing,  Tooth/dental problems,  Sore throat,                No sneezing, itching, ear ache, nasal congestion, post nasal drip,  CV:  No chest pain, orthopnea, PND, swelling in lower extremities, anasarca, dizziness, +palpitations GI  No heartburn, indigestion, abdominal pain, nausea, vomiting,  Resp: + shortness of breath with exertion or at rest.  No excess mucus, no- productive cough, + non-productive cough,  No- coughing up of blood.  No-change in color of mucus.  Occasional   wheezing.  Skin: no rash or lesions. GU: no dysuria, . MS: No acute pain. Psych:  No change in mood or affect. No depression or anxiety.  No memory loss.  Objective:  General- Alert, Oriented,  Affect-appropriate, Distress- none acute. +Wheelchair Skin- rash-none, lesions- none, excoriation- none Lymphadenopathy- none Head- atraumatic            Eyes- Gross vision intact, PERRLA, conjunctivae clear secretions            Ears- Hearing, canals normal for age            Nose- Clear, no-Septal dev, mucus, polyps, erosion, perforation             Throat- Mallampati II , mucosa clear , drainage-  none, tonsils- atrophic Neck- flexible , trachea midline, no stridor , thyroid nl, carotid no bruit Chest - symmetrical excursion , unlabored           Heart/CV- irreg , no murmur , no gallop  , no rub, nl s1 s2                           - JVD-none , edema+3, stasis changes- none, varices- none,                                     +weeping clear fluid from left calf           Lung- +no rales, cough- none , dullness-none, rub- none           Chest wall-  Abd-  Br/ Gen/ Rectal- Not done, not indicated Extrem- cyanosis- none, clubbing, none, atrophy- none, strength- nl Neuro- grossly intact to observation

## 2014-12-13 NOTE — Telephone Encounter (Signed)
lmtcb X1 for pt's daughter.  Per CY, we are to relay that pt's leg edema is worsening, we have increased her lasix for a little while, she is to elevate her feet when sitting down, and to see her cardiologist.

## 2014-12-13 NOTE — Patient Instructions (Addendum)
Recommend [employer] [facility] on left leg [facility]  Recommend increasing lasix to: 80 mg AM and 40 mg PM x 3 days Then 40 mg twice daily x 3 days Then 40 mg daily if sufficient to control leg edema  Follow up with cardiology  Elevate legs whenever sitting  Elastic hose (TED) or Fitted compression hose (Jobst)   Follow up with cardiology as planned  Keep appointment here March 21

## 2014-12-13 NOTE — Telephone Encounter (Signed)
Spoke with pt's son and daughter to relay cy's recs.  Nothing further needed.

## 2014-12-13 NOTE — Assessment & Plan Note (Signed)
Controlled for now 

## 2014-12-14 ENCOUNTER — Telehealth: Payer: Self-pay | Admitting: Adult Health

## 2014-12-14 NOTE — Telephone Encounter (Signed)
Patient down to 141 lbs / 5 lb weight loss / tgs

## 2014-12-14 NOTE — Telephone Encounter (Signed)
Carrie Heath from [facility] returned call- [phone] Fax # [phone]

## 2014-12-14 NOTE — Telephone Encounter (Signed)
Spoke with Marylene LandAngela at [facility].  Requested hand written order/Rx for the increase in Lasix.  This has been written, signed and faxed back to [phone] Nothing further needed.

## 2014-12-16 ENCOUNTER — Telehealth: Payer: Self-pay | Admitting: *Deleted

## 2014-12-16 NOTE — Telephone Encounter (Signed)
Received faxed papers from home health concerned over pt's lasix dose prescribed by Dr Jetty Duhamellinton Young at recent office visit with him. Her orders have patient taking 6 days of increase lasix dose-80 mg BID  and his office note indicates pt is to take Lasix 40 mg twice a day only.Pt has lost 7 lbs already, wt today 139.8 lbs.and they are afraid she is losing too much wt.I told her to call Dr.Young's office to express her concern and clarify order as he is the one who faxed her orders.     FYI to Dr.Koneswaran

## 2014-12-16 NOTE — Telephone Encounter (Signed)
PT has been loosing weight on increase in lasix but nicole with home health is concerned about her being depleted over weekend. She is back down to weight when being release from hospital. She is faxing log for us to see. Please advise/tmj

## 2014-12-25 ENCOUNTER — Telehealth: Payer: Self-pay | Admitting: Physician Assistant

## 2014-12-25 NOTE — Telephone Encounter (Signed)
Nicole home health nurse called with weight update. Weights seem to be variable recently.  Home health nurse indicates she has a dry weight is 139. At hospital discharge in 11/2014, she had a weight of 145lb. She was seen in the office 12/01/14 with weight 147lb at which time she was felt to be euvolemic. She was seen by pulmonary earlier this month at which time weight was 143 but she had SOB and LEE thus Lasix was increased to 80mg  qam/40mg  qpm x 3 days, 40mg  bid x 3 days, then 40mg  daily which she is currently taking. Weight got down to 136lb with improvement of symptoms but is now going back up. Yesterday it was 139 and today it is 142. HHRN concerned about fluid reaccumulation. The patient has not been compliant with fluid restriction. Prior maintenance dose of Lasix post-discharge was 40mg  BID but she is now on 40mg  daily. I advised she take an extra 1/2 tablet today only (20mg ) and if weight not back to dry weight of 139lb tomorrow to call the office. Fluid restriction to be reinforced. HHRN verbalized understanding of plan. Dayna Dunn PA-C

## 2014-12-26 ENCOUNTER — Telehealth: Payer: Self-pay | Admitting: *Deleted

## 2014-12-26 NOTE — Telephone Encounter (Signed)
Have her go back up on her lasix to 80mg  in the am and 40 mg in the pm for today again for 3 days. Then back to 40 mg BID thereafter. She will need to have higher maintenance doses if she is going to be non compliant..Marland Kitchen

## 2014-12-26 NOTE — Telephone Encounter (Signed)
Also, she is to go up on potassium to BID. BMET on Wed,

## 2014-12-26 NOTE — Telephone Encounter (Signed)
Will forward to Kathryn Lawrence, N.P.  

## 2014-12-26 NOTE — Telephone Encounter (Signed)
Spoke with Marcel[facility]Michelle at [facility]. Faxed Rx and lab instructions to [phone]. Marcelino DusterMichelle voiced understanding.

## 2014-12-26 NOTE — Telephone Encounter (Signed)
Laurann Montanaayna N Dunn, PA-C at 12/25/2014 9:33 AM     Status: Signed       Expand All Collapse All   Joni ReiningNicole home health nurse called with weight update. Weights seem to be variable recently.  Home health nurse indicates she has a dry weight is 139. At hospital discharge in 11/2014, she had a weight of 145lb. She was seen in the office 12/01/14 with weight 147lb at which time she was felt to be euvolemic. She was seen by pulmonary earlier this month at which time weight was 143 but she had SOB and LEE thus Lasix was increased to 80mg  qam/40mg  qpm x 3 days, 40mg  bid x 3 days, then 40mg  daily which she is currently taking. Weight got down to 136lb with improvement of symptoms but is now going back up. Yesterday it was 139 and today it is 142. HHRN concerned about fluid reaccumulation. The patient has not been compliant with fluid restriction. Prior maintenance dose of Lasix post-discharge was 40mg  BID but she is now on 40mg  daily. I advised she take an extra 1/2 tablet today only (20mg ) and if weight not back to dry weight of 139lb tomorrow to call the office. Fluid restriction to be reinforced. HHRN verbalized understanding of plan. Dayna Dunn PA-C       [facility] weight today of 140.4 Lb. Please advise.

## 2015-01-02 ENCOUNTER — Other Ambulatory Visit: Payer: Self-pay | Admitting: Cardiovascular Disease

## 2015-01-05 ENCOUNTER — Telehealth: Payer: Self-pay | Admitting: Adult Health

## 2015-01-05 NOTE — Telephone Encounter (Signed)
Patient's weight is 133.8 as of 2/25.  Parameters are 136-144. / tgs

## 2015-01-05 NOTE — Telephone Encounter (Signed)
Great.  No changes.

## 2015-01-05 NOTE — Telephone Encounter (Signed)
Will forward to K Lawrence NP 

## 2015-01-06 ENCOUNTER — Emergency Department (HOSPITAL_COMMUNITY): Payer: Medicare Other

## 2015-01-06 ENCOUNTER — Encounter: Payer: Self-pay | Admitting: Adult Health

## 2015-01-06 ENCOUNTER — Emergency Department (HOSPITAL_COMMUNITY)
Admission: EM | Admit: 2015-01-06 | Discharge: 2015-01-06 | Disposition: A | Discharge status: Home / Self Care | Payer: Medicare Other | Attending: Emergency Medicine | Admitting: Emergency Medicine

## 2015-01-06 ENCOUNTER — Encounter (HOSPITAL_COMMUNITY): Payer: Self-pay | Admitting: *Deleted

## 2015-01-06 DIAGNOSIS — Y9389 Activity, other specified: Secondary | ICD-10-CM | POA: Diagnosis not present

## 2015-01-06 DIAGNOSIS — Y9289 Other specified places as the place of occurrence of the external cause: Secondary | ICD-10-CM | POA: Insufficient documentation

## 2015-01-06 DIAGNOSIS — Z87891 Personal history of nicotine dependence: Secondary | ICD-10-CM | POA: Insufficient documentation

## 2015-01-06 DIAGNOSIS — S3992XA Unspecified injury of lower back, initial encounter: Secondary | ICD-10-CM | POA: Insufficient documentation

## 2015-01-06 DIAGNOSIS — Y998 Other external cause status: Secondary | ICD-10-CM | POA: Insufficient documentation

## 2015-01-06 DIAGNOSIS — J449 Chronic obstructive pulmonary disease, unspecified: Secondary | ICD-10-CM | POA: Diagnosis not present

## 2015-01-06 DIAGNOSIS — I10 Essential (primary) hypertension: Secondary | ICD-10-CM | POA: Diagnosis not present

## 2015-01-06 DIAGNOSIS — S51011A Laceration without foreign body of right elbow, initial encounter: Secondary | ICD-10-CM

## 2015-01-06 DIAGNOSIS — S0990XA Unspecified injury of head, initial encounter: Secondary | ICD-10-CM | POA: Diagnosis present

## 2015-01-06 DIAGNOSIS — Z79899 Other long term (current) drug therapy: Secondary | ICD-10-CM | POA: Diagnosis not present

## 2015-01-06 DIAGNOSIS — E1140 Type 2 diabetes mellitus with diabetic neuropathy, unspecified: Secondary | ICD-10-CM | POA: Insufficient documentation

## 2015-01-06 DIAGNOSIS — W01198A Fall on same level from slipping, tripping and stumbling with subsequent striking against other object, initial encounter: Secondary | ICD-10-CM | POA: Diagnosis not present

## 2015-01-06 DIAGNOSIS — E785 Hyperlipidemia, unspecified: Secondary | ICD-10-CM | POA: Insufficient documentation

## 2015-01-06 DIAGNOSIS — W19XXXA Unspecified fall, initial encounter: Secondary | ICD-10-CM

## 2015-01-06 DIAGNOSIS — S51001A Unspecified open wound of right elbow, initial encounter: Secondary | ICD-10-CM | POA: Insufficient documentation

## 2015-01-06 DIAGNOSIS — E780 Pure hypercholesterolemia: Secondary | ICD-10-CM | POA: Diagnosis not present

## 2015-01-06 DIAGNOSIS — S0101XA Laceration without foreign body of scalp, initial encounter: Secondary | ICD-10-CM | POA: Insufficient documentation

## 2015-01-06 MED ORDER — LIDOCAINE-EPINEPHRINE (PF) 2 %-1:200000 IJ SOLN
INTRAMUSCULAR | Status: AC
  Administered 2015-01-06: 22:00:00
  Filled 2015-01-06: qty 20

## 2015-01-06 MED ORDER — TETANUS-DIPHTH-ACELL PERTUSSIS 5-2.5-18.5 LF-MCG/0.5 IM SUSP
0.5000 mL | Freq: Once | INTRAMUSCULAR | Status: AC
  Administered 2015-01-06: 0.5 mL via INTRAMUSCULAR
  Filled 2015-01-06: qty 0.5

## 2015-01-06 NOTE — ED Provider Notes (Signed)
CSN: [id number]     Arrival date & time 01/06/15  1932 History   First MD Initiated Contact with Patient 01/06/15 1943     Chief Complaint  Patient presents with  . Fall     (Consider location/radiation/quality/duration/timing/severity/associated sxs/prior Treatment) HPI Comments: Patient from nursing home after fall from standing. She states that she turned quickly and stumbled and struck the back of her head on her nightstand. Denies losing consciousness. She also has a skin tear to her right elbow. Denies any dizziness or lightheadedness. No chest pain or shortness of breath. No abdominal pain, nausea or vomiting.  complains of some low back pain. She uses a walker at baseline. She takes Pradaxa for history of atrial fibrillation.   The history is provided by the patient and the EMS personnel.    Past Medical History  Diagnosis Date  . DM type 2 (diabetes mellitus, type 2)   . Bronchitis   . COPD (chronic obstructive pulmonary disease)   . Hyperlipidemia   . HTN (hypertension)   . Neuropathy   . Chronic a-fib   . Diabetes mellitus with neuropathy   . High cholesterol    Past Surgical History  Procedure Laterality Date  . Thyroidectomy, partial      remote  . Tonsillectomy    . Appendectomy    . Cholecystectomy    . Total abdominal hysterectomy    . Bladder surgery     Family History  Problem Relation Age of Onset  . Heart disease Father   . Heart disease Brother     CABG  . Alzheimer's disease Mother    History  Substance Use Topics  . Smoking status: Former Smoker -- 0.50 packs/day    Types: Cigarettes    Start date: 07/11/1944    Quit date: 11/11/2006  . Smokeless tobacco: Former Neurosurgeon  . Alcohol Use: No   OB History    No data available     Review of Systems  Constitutional: Negative for fever, activity change and appetite change.  HENT: Negative for congestion and rhinorrhea.   Respiratory: Negative for cough, chest tightness and shortness of breath.    Cardiovascular: Negative for chest pain.  Gastrointestinal: Negative for nausea, vomiting and abdominal pain.  Genitourinary: Negative for dysuria and hematuria.  Musculoskeletal: Positive for myalgias, back pain and arthralgias.  Skin: Positive for wound.  Neurological: Positive for headaches. Negative for dizziness and weakness.  A complete 10 system review of systems was obtained and all systems are negative except as noted in the HPI and PMH.      Allergies  Review of patient's allergies indicates no known allergies.  Home Medications   Prior to Admission medications   Medication Sig Start Date End Date Taking? Authorizing Provider  acetaminophen (TYLENOL) 325 MG tablet Take 650 mg by mouth 3 (three) times daily.   Yes Historical Provider, MD  CALCIUM CITRATE PO Take 2 tablets by mouth daily.   Yes Historical Provider, MD  dabigatran (PRADAXA) 75 MG CAPS Take 1 capsule (75 mg total) by mouth every 12 (twelve) hours. 12/02/12  Yes Gaylord Shih, MD  diltiazem (CARDIZEM SR) 60 MG 12 hr capsule Take 1 capsule (60 mg total) by mouth every 12 (twelve) hours. 11/18/14  Yes Rhetta Mura, MD  furosemide (LASIX) 40 MG tablet Take 1 tablet (40 mg total) by mouth 2 (two) times daily. 11/18/14  Yes Rhetta Mura, MD  gabapentin (NEURONTIN) 600 MG tablet Take 600 mg by mouth 4 (four)  times daily.   Yes Historical Provider, MD  guaiFENesin (MUCINEX) 600 MG 12 hr tablet Take 600 mg by mouth 2 (two) times daily.   Yes Historical Provider, MD  metFORMIN (GLUCOPHAGE) 500 MG tablet Take 500 mg by mouth 2 (two) times daily with a meal.     Yes Historical Provider, MD  Multiple Vitamins-Minerals (ABC PLUS SENIOR) TABS Take 1 tablet by mouth daily.   Yes Historical Provider, MD  omeprazole (PRILOSEC) 20 MG capsule Take 1 capsule by mouth daily. 09/10/13  Yes Historical Provider, MD  potassium chloride SA (K-DUR,KLOR-CON) 20 MEQ tablet TAKE 1 TABLET BY MOUTH TWICE DAILY. 01/02/15  Yes Laqueta Linden, MD  pravastatin (PRAVACHOL) 20 MG tablet Take 20 mg by mouth daily. 07/25/14  Yes Historical Provider, MD  traMADol (ULTRAM) 50 MG tablet Take 50 mg by mouth every 12 (twelve) hours.    Yes Historical Provider, MD  triamcinolone cream (KENALOG) 0.1 % Apply 1 application topically daily. COMPOUND 1:1 [id number] 12/06/14  Yes Historical Provider, MD  albuterol (PROAIR HFA) 108 (90 BASE) MCG/ACT inhaler Inhale 2 puffs into the lungs every 4 (four) hours as needed for wheezing or shortness of breath. 10/15/13 12/13/14  Waymon Budge, MD  benzonatate (TESSALON PERLES) 100 MG capsule Take 1 capsule (100 mg total) by mouth 3 (three) times daily as needed for cough. Patient not taking: Reported on 01/06/2015 11/01/14   Julio Sicks, NP  Respiratory Therapy Supplies (FLUTTER) DEVI Blow through 4 times, and do this 3 times daily when needed 01/27/12   Waymon Budge, MD   BP 140/75 mmHg  Pulse 82  Temp(Src) 98.3 F (36.8 C) (Oral)  Resp 17  Ht 5' (1.524 m)  Wt 139 lb 8 oz (63.277 kg)  BMI 27.24 kg/m2  SpO2 91% Physical Exam  Constitutional: She is oriented to person, place, and time. She appears well-developed and well-nourished. No distress.  HENT:  Head: Normocephalic and atraumatic.  Mouth/Throat: Oropharynx is clear and moist. No oropharyngeal exudate.  Scattered abrasions to occiptal scalp. 1 cm repairable laceration Posterior scalp hematoma  Eyes: Conjunctivae and EOM are normal. Pupils are equal, round, and reactive to light.  Neck: Normal range of motion. Neck supple.  No C spine tenderness  Cardiovascular: Normal rate, regular rhythm, normal heart sounds and intact distal pulses.   No murmur heard. Pulmonary/Chest: Effort normal and breath sounds normal. No respiratory distress.  Abdominal: Soft. There is no tenderness. There is no rebound and no guarding.  Musculoskeletal: Normal range of motion. She exhibits tenderness. She exhibits no edema.  Skin tear R elbow. No bony  tenderness. ROM intact TTP lumbar spine.  Pelvis stable  Neurological: She is alert and oriented to person, place, and time. No cranial nerve deficit. She exhibits normal muscle tone. Coordination normal.  No ataxia on finger to nose bilaterally. No pronator drift. 5/5 strength throughout. CN 2-12 intact.Equal grip strength. Sensation intact.   Skin: Skin is warm.  Psychiatric: She has a normal mood and affect. Her behavior is normal.  Nursing note and vitals reviewed.   ED Course  LACERATION REPAIR Date/Time: 01/06/2015 10:10 PM Performed by: Glynn Octave Authorized by: Glynn Octave Consent: Verbal consent obtained. Risks and benefits: risks, benefits and alternatives were discussed Consent given by: patient Patient understanding: patient states understanding of the procedure being performed Patient consent: the patient's understanding of the procedure matches consent given Procedure consent: procedure consent matches procedure scheduled Relevant documents: relevant documents present and verified Imaging  studies: imaging studies available Patient identity confirmed: provided demographic data Time out: Immediately prior to procedure a "time out" was called to verify the correct patient, procedure, equipment, support staff and site/side marked as required. Body area: head/neck Location details: scalp Laceration length: 1 cm Tendon involvement: none Nerve involvement: none Vascular damage: no Anesthesia: local infiltration Local anesthetic: lidocaine 1% with epinephrine Anesthetic total: 3 ml Patient sedated: no Preparation: Patient was prepped and draped in the usual sterile fashion. Irrigation solution: saline Irrigation method: syringe Amount of cleaning: standard Debridement: none Degree of undermining: none Skin closure: staples Number of sutures: 2 Technique: simple Approximation: close Approximation difficulty: simple Dressing: gauze roll Patient tolerance:  Patient tolerated the procedure well with no immediate complications   (including critical care time) Labs Review Labs Reviewed - No data to display  Imaging Review Dg Chest 1 View  01/06/2015   CLINICAL DATA:  Larey SeatFell at nursing home today, lost balance and hit head on bedside table. Posterior head laceration, back and elbow pain. History of hypertension, diabetes and atrial fibrillation.  EXAM: CHEST  1 VIEW  COMPARISON:  Chest radiograph November 15, 2014  FINDINGS: Cardiac silhouette is upper limits of normal size, mediastinal silhouette is nonsuspicious. Mild bronchitic changes without focal consolidation or pleural effusion, improved aeration of the lung bases. No pneumothorax. Osteopenic. Mild degenerative change of the thoracic spine with thoracolumbar scoliosis.  IMPRESSION: Borderline cardiomegaly. Mild bronchitic changes without focal consolidation.   Electronically Signed   By: Awilda Metroourtnay  Bloomer   On: 01/06/2015 21:41   Dg Lumbar Spine Complete  01/06/2015   CLINICAL DATA:  Larey SeatFell at nursing home today, lost balance and hit head on bedside table. Posterior head laceration, back and elbow pain. History of hypertension, diabetes and atrial fibrillation.  EXAM: LUMBAR SPINE - COMPLETE 4+ VIEW  COMPARISON:  None.  FINDINGS: Lumbar vertebral bodies appear intact and aligned, maintenance of the lumbar lordosis. Moderate to severe L1-2, L2-3, L4-5 and L5-S1 disc height loss, endplate sclerosis and marginal spurring consistent with degenerative discs. Moderate mid to lower lumbar spine facet arthropathy. Levoscoliosis, apex at L3-4. No destructive bony lesions. Patient is osteopenic. No definite pars interarticularis defects though, scoliosis limits of assessment of the LEFT pars interarticularis.  Surgical clips in the included right abdomen likely reflect cholecystectomy. Calcified fibroid LEFT pelvis. Phleboliths.  IMPRESSION: Degenerative change of the lumbar spine with scoliosis, no acute fracture  deformity or malalignment.   Electronically Signed   By: Awilda Metroourtnay  Bloomer   On: 01/06/2015 21:44   Dg Pelvis 1-2 Views  01/06/2015   ADDENDUM REPORT: 01/06/2015 21:47  COMPARISON:  Pelvic radiograph Mar 11, 2005 ; LEFT greater trochanter probable enchondroma was more conspicuous on prior examination, patient is now severely osteopenic.   Electronically Signed   By: Awilda Metroourtnay  Bloomer   On: 01/06/2015 21:47   01/06/2015   CLINICAL DATA:  Larey SeatFell at nursing home today, lost balance and hit head on bedside table. Posterior head laceration, back and elbow pain. History of hypertension, diabetes and atrial fibrillation.  EXAM: PELVIS - 1-2 VIEW  COMPARISON:  None.  FINDINGS: There is no evidence of pelvic fracture or diastasis. Mild degenerative change of the sacroiliac joints. No pelvic bone lesions are seen. Patient is osteopenic. Calcified fibroid in LEFT pelvis. Phleboliths.  IMPRESSION: No acute fracture deformity or dislocation.  Electronically Signed: By: Awilda Metroourtnay  Bloomer On: 01/06/2015 21:42   Dg Elbow Complete Right  01/06/2015   CLINICAL DATA:  Skin tear to the right elbow.  Patient fell of the nursing home earlier today. Loss of balance and hit head on bedside table. Laceration to the back of the head appear  EXAM: RIGHT ELBOW - COMPLETE 3+ VIEW  COMPARISON:  None.  FINDINGS: Soft tissue injury over the posterior aspect of the proximal ulna. No radiopaque soft tissue foreign bodies. Bones appear intact. No evidence of acute fracture or dislocation. No focal bone lesion or bone destruction. No significant effusion.  IMPRESSION: Soft tissue injury over the posterior right elbow. No acute bony abnormalities.   Electronically Signed   By: Burman Nieves M.D.   On: 01/06/2015 21:38   Ct Head Wo Contrast  01/06/2015   CLINICAL DATA:  Pt fell striking posterior head on bedside table.Sharp pain to posterior head and posterior neck. Bilateral neck stiffnessWeakenss, headache, dizzinessPatient wearing c-collar   EXAM: CT HEAD WITHOUT CONTRAST  CT CERVICAL SPINE WITHOUT CONTRAST  TECHNIQUE: Multidetector CT imaging of the head and cervical spine was performed following the standard protocol without intravenous contrast. Multiplanar CT image reconstructions of the cervical spine were also generated.  COMPARISON:  None.  FINDINGS: CT HEAD FINDINGS  No intracranial hemorrhage. No parenchymal contusion. No midline shift or mass effect. Basilar cisterns are patent. No skull base fracture. No fluid in the paranasal sinuses or mastoid air cells. Orbits are normal.  Mild age expected atrophy.  Small scalp hematoma over the left occiput No skull fracture.  CT CERVICAL SPINE FINDINGS  No prevertebral soft tissue swelling. Normal alignment of cervical vertebral bodies. No loss of vertebral body height. Normal facet articulation. Normal craniocervical junction.  There is anterolisthesis of C3 on C4 by 3 mm. This is felt to be degenerative in nature. There is multiple levels of endplate spurring and joint space narrowing.  No evidence epidural or paraspinal hematoma.  .  IMPRESSION: 1. No intracranial trauma. 2. Very small posterior scalp hematoma. 3. No cervical spine fracture. 4. Degenerative anterolisthesis of C3 on C4. 5. Multilevel disc osteophytic disease.   Electronically Signed   By: Genevive Bi M.D.   On: 01/06/2015 21:12   Ct Cervical Spine Wo Contrast  01/06/2015   CLINICAL DATA:  Pt fell striking posterior head on bedside table.Sharp pain to posterior head and posterior neck. Bilateral neck stiffnessWeakenss, headache, dizzinessPatient wearing c-collar  EXAM: CT HEAD WITHOUT CONTRAST  CT CERVICAL SPINE WITHOUT CONTRAST  TECHNIQUE: Multidetector CT imaging of the head and cervical spine was performed following the standard protocol without intravenous contrast. Multiplanar CT image reconstructions of the cervical spine were also generated.  COMPARISON:  None.  FINDINGS: CT HEAD FINDINGS  No intracranial hemorrhage. No  parenchymal contusion. No midline shift or mass effect. Basilar cisterns are patent. No skull base fracture. No fluid in the paranasal sinuses or mastoid air cells. Orbits are normal.  Mild age expected atrophy.  Small scalp hematoma over the left occiput No skull fracture.  CT CERVICAL SPINE FINDINGS  No prevertebral soft tissue swelling. Normal alignment of cervical vertebral bodies. No loss of vertebral body height. Normal facet articulation. Normal craniocervical junction.  There is anterolisthesis of C3 on C4 by 3 mm. This is felt to be degenerative in nature. There is multiple levels of endplate spurring and joint space narrowing.  No evidence epidural or paraspinal hematoma.  .  IMPRESSION: 1. No intracranial trauma. 2. Very small posterior scalp hematoma. 3. No cervical spine fracture. 4. Degenerative anterolisthesis of C3 on C4. 5. Multilevel disc osteophytic disease.   Electronically Signed   By:  Genevive Bi M.D.   On: 01/06/2015 21:12     EKG Interpretation   Date/Time:  Friday January 06 2015 22:34:11 EST Ventricular Rate:  80 PR Interval:    QRS Duration: 90 QT Interval:  382 QTC Calculation: 441 R Axis:   -58 Text Interpretation:  Atrial fibrillation Left anterior fascicular block  Low voltage, precordial leads Abnormal R-wave progression, late transition  Confirmed by COOK  MD, BRIAN ([id number]) on 01/06/2015 10:36:28 PM      MDM   Final diagnoses:  Fall, initial encounter  Head injury, initial encounter  Skin tear of elbow without complication, right, initial encounter   Mechanical fall on anticoagulation presenting with skin tear to right elbow and abrasions to back of head. Neurologically intact.  CT head and C-spine negative. C-collar removed. Right elbow x-ray negative. Skin tear with bleeding controlled. Chest x-ray and pelvis x-ray negative.  Wound repaired as above.  skin tear with bleeding controlled the right elbow. Patient able to ambulate with a walker  which is her baseline.  Imaging negative.  D.w patient and family risk for possible delayed bleeding with pradaxa use. Followup with PCP in 1 week for suture removal.  Return to the ED with worsening headache, confusion, weakness, or any other concerns.      Glynn Octave, MD 01/06/15 [phone]

## 2015-01-06 NOTE — ED Notes (Signed)
[facility]  Pt fell striking head on.  Laceration to back of head.  Bleeding controlled at this time.  Skin tear noted to right elbow.  Pt denies dizziness or visual difficulties at this time.

## 2015-01-06 NOTE — ED Notes (Signed)
Removed C-collar per EDP verbal order, pt tolerated well. Washed back of head with soap and water, noted 2 small abrasions with no bleeding until cleansed. Advised EDP of findings.

## 2015-01-06 NOTE — ED Notes (Signed)
Pt. Sat 88% on room air. Pt. Placed on 2L [place] sat 95%. Pt. Reports wearing O2 at night.

## 2015-01-06 NOTE — ED Notes (Signed)
EKG Interpretation  Date/Time:  Friday January 06 2015 22:34:11 EST Ventricular Rate:  80 PR Interval:    QRS Duration: 90 QT Interval:  382 QTC Calculation: 441 R Axis:   -58 Text Interpretation:  Atrial fibrillation Left anterior fascicular block Low voltage, precordial leads Abnormal R-wave progression, late transition Confirmed by Adriana SimasOOK  MD, BRIAN ([id number]) on 01/06/2015 10:36:28 PM        Ward GivensIva L Shreya Lacasse, MD 01/06/15 [id number]

## 2015-01-06 NOTE — Discharge Instructions (Signed)
Concussion Follow up with your doctor for suture removal in 1 week. Return to the ED with worsening pain, confusion, weakness, bleeding or any other concerns. A concussion, or closed-head injury, is a brain injury caused by a direct blow to the head or by a quick and sudden movement (jolt) of the head or neck. Concussions are usually not life-threatening. Even so, the effects of a concussion can be serious. If you have had a concussion before, you are more likely to experience concussion-like symptoms after a direct blow to the head.  CAUSES  Direct blow to the head, such as from running into another player during a soccer game, being hit in a fight, or hitting your head on a hard surface.  A jolt of the head or neck that causes the brain to move back and forth inside the skull, such as in a car crash. SIGNS AND SYMPTOMS The signs of a concussion can be hard to notice. Early on, they may be missed by you, family members, and health care providers. You may look fine but act or feel differently. Symptoms are usually temporary, but they may last for days, weeks, or even longer. Some symptoms may appear right away while others may not show up for hours or days. Every head injury is different. Symptoms include:  Mild to moderate headaches that will not go away.  A feeling of pressure inside your head.  Having more trouble than usual:  Learning or remembering things you have heard.  Answering questions.  Paying attention or concentrating.  Organizing daily tasks.  Making decisions and solving problems.  Slowness in thinking, acting or reacting, speaking, or reading.  Getting lost or being easily confused.  Feeling tired all the time or lacking energy (fatigued).  Feeling drowsy.  Sleep disturbances.  Sleeping more than usual.  Sleeping less than usual.  Trouble falling asleep.  Trouble sleeping (insomnia).  Loss of balance or feeling lightheaded or dizzy.  Nausea or  vomiting.  Numbness or tingling.  Increased sensitivity to:  Sounds.  Lights.  Distractions.  Vision problems or eyes that tire easily.  Diminished sense of taste or smell.  Ringing in the ears.  Mood changes such as feeling sad or anxious.  Becoming easily irritated or angry for little or no reason.  Lack of motivation.  Seeing or hearing things other people do not see or hear (hallucinations). DIAGNOSIS Your health care provider can usually diagnose a concussion based on a description of your injury and symptoms. He or she will ask whether you passed out (lost consciousness) and whether you are having trouble remembering events that happened right before and during your injury. Your evaluation might include:  A brain scan to look for signs of injury to the brain. Even if the test shows no injury, you may still have a concussion.  Blood tests to be sure other problems are not present. TREATMENT  Concussions are usually treated in an emergency department, in urgent care, or at a clinic. You may need to stay in the hospital overnight for further treatment.  Tell your health care provider if you are taking any medicines, including prescription medicines, over-the-counter medicines, and natural remedies. Some medicines, such as blood thinners (anticoagulants) and aspirin, may increase the chance of complications. Also tell your health care provider whether you have had alcohol or are taking illegal drugs. This information may affect treatment.  Your health care provider will send you home with important instructions to follow.  How fast you  will recover from a concussion depends on many factors. These factors include how severe your concussion is, what part of your brain was injured, your age, and how healthy you were before the concussion.  Most people with mild injuries recover fully. Recovery can take time. In general, recovery is slower in older persons. Also, persons who  have had a concussion in the past or have other medical problems may find that it takes longer to recover from their current injury. HOME CARE INSTRUCTIONS General Instructions  Carefully follow the directions your health care provider gave you.  Only take over-the-counter or prescription medicines for pain, discomfort, or fever as directed by your health care provider.  Take only those medicines that your health care provider has approved.  Do not drink alcohol until your health care provider says you are well enough to do so. Alcohol and certain other drugs may slow your recovery and can put you at risk of further injury.  If it is harder than usual to remember things, write them down.  If you are easily distracted, try to do one thing at a time. For example, do not try to watch TV while fixing dinner.  Talk with family members or close friends when making important decisions.  Keep all follow-up appointments. Repeated evaluation of your symptoms is recommended for your recovery.  Watch your symptoms and tell others to do the same. Complications sometimes occur after a concussion. Older adults with a brain injury may have a higher risk of serious complications, such as a blood clot on the brain.  Tell your teachers, school nurse, school counselor, coach, athletic trainer, or work Production designer, theatre/television/filmmanager about your injury, symptoms, and restrictions. Tell them about what you can or cannot do. They should watch for:  Increased problems with attention or concentration.  Increased difficulty remembering or learning new information.  Increased time needed to complete tasks or assignments.  Increased irritability or decreased ability to cope with stress.  Increased symptoms.  Rest. Rest helps the brain to heal. Make sure you:  Get plenty of sleep at night. Avoid staying up late at night.  Keep the same bedtime hours on weekends and weekdays.  Rest during the day. Take daytime naps or rest breaks  when you feel tired.  Limit activities that require a lot of thought or concentration. These include:  Doing homework or job-related work.  Watching TV.  Working on the computer.  Avoid any situation where there is potential for another head injury (football, hockey, soccer, basketball, martial arts, downhill snow sports and horseback riding). Your condition will get worse every time you experience a concussion. You should avoid these activities until you are evaluated by the appropriate follow-up health care providers. Returning To Your Regular Activities You will need to return to your normal activities slowly, not all at once. You must give your body and brain enough time for recovery.  Do not return to sports or other athletic activities until your health care provider tells you it is safe to do so.  Ask your health care provider when you can drive, ride a bicycle, or operate heavy machinery. Your ability to react may be slower after a brain injury. Never do these activities if you are dizzy.  Ask your health care provider about when you can return to work or school. Preventing Another Concussion It is very important to avoid another brain injury, especially before you have recovered. In rare cases, another injury can lead to permanent brain damage, brain  swelling, or death. The risk of this is greatest during the first 7-10 days after a head injury. Avoid injuries by:  Wearing a seat belt when riding in a car.  Drinking alcohol only in moderation.  Wearing a helmet when biking, skiing, skateboarding, skating, or doing similar activities.  Avoiding activities that could lead to a second concussion, such as contact or recreational sports, until your health care provider says it is okay.  Taking safety measures in your home.  Remove clutter and tripping hazards from floors and stairways.  Use grab bars in bathrooms and handrails by stairs.  Place non-slip mats on floors and in  bathtubs.  Improve lighting in dim areas. SEEK MEDICAL CARE IF:  You have increased problems paying attention or concentrating.  You have increased difficulty remembering or learning new information.  You need more time to complete tasks or assignments than before.  You have increased irritability or decreased ability to cope with stress.  You have more symptoms than before. Seek medical care if you have any of the following symptoms for more than 2 weeks after your injury:  Lasting (chronic) headaches.  Dizziness or balance problems.  Nausea.  Vision problems.  Increased sensitivity to noise or light.  Depression or mood swings.  Anxiety or irritability.  Memory problems.  Difficulty concentrating or paying attention.  Sleep problems.  Feeling tired all the time. SEEK IMMEDIATE MEDICAL CARE IF:  You have severe or worsening headaches. These may be a sign of a blood clot in the brain.  You have weakness (even if only in one hand, leg, or part of the face).  You have numbness.  You have decreased coordination.  You vomit repeatedly.  You have increased sleepiness.  One pupil is larger than the other.  You have convulsions.  You have slurred speech.  You have increased confusion. This may be a sign of a blood clot in the brain.  You have increased restlessness, agitation, or irritability.  You are unable to recognize people or places.  You have neck pain.  It is difficult to wake you up.  You have unusual behavior changes.  You lose consciousness. MAKE SURE YOU:  Understand these instructions.  Will watch your condition.  Will get help right away if you are not doing well or get worse. Document Released: 01/18/2004 Document Revised: 11/02/2013 Document Reviewed: 05/20/2013 [facility] Patient Information 2015 [place]. This information is not intended to replace advice given to you by your health care provider. Make sure you discuss any  questions you have with your health care provider.

## 2015-01-06 NOTE — ED Notes (Signed)
Discharge instructions given, pt demonstrated teach back and verbal understanding.[facility] called and report given. No concerns voiced.

## 2015-01-10 ENCOUNTER — Telehealth: Payer: Self-pay | Admitting: Internal Medicine

## 2015-01-10 NOTE — Telephone Encounter (Signed)
Spoke with Marlou PorchNaomi, Angela is not in office.  Requesting that an order be sent to restart the patient's prednisone. This was stopped by MD in ED over the weekend. Pt is starting to have increased breathing issues.  (F) [phone] (med room fax) (F) [phone] (main fax)  Please advise Dr Maple HudsonYoung. Thanks.  No Known Allergies   Medication List       This list is accurate as of: 01/10/15  4:19 PM.  Always use your most recent med list.               ABC PLUS SENIOR Tabs  Take 1 tablet by mouth daily.     acetaminophen 325 MG tablet  Commonly known as:  TYLENOL  Take 650 mg by mouth 3 (three) times daily.     albuterol 108 (90 BASE) MCG/ACT inhaler  Commonly known as:  PROAIR HFA  Inhale 2 puffs into the lungs every 4 (four) hours as needed for wheezing or shortness of breath.     benzonatate 100 MG capsule  Commonly known as:  TESSALON PERLES  Take 1 capsule (100 mg total) by mouth 3 (three) times daily as needed for cough.     CALCIUM CITRATE PO  Take 2 tablets by mouth daily.     dabigatran 75 MG Caps capsule  Commonly known as:  PRADAXA  Take 1 capsule (75 mg total) by mouth every 12 (twelve) hours.     diltiazem 60 MG 12 hr capsule  Commonly known as:  CARDIZEM SR  Take 1 capsule (60 mg total) by mouth every 12 (twelve) hours.     FLUTTER Devi  Blow through 4 times, and do this 3 times daily when needed     furosemide 40 MG tablet  Commonly known as:  LASIX  Take 1 tablet (40 mg total) by mouth 2 (two) times daily.     gabapentin 600 MG tablet  Commonly known as:  NEURONTIN  Take 600 mg by mouth 4 (four) times daily.     guaiFENesin 600 MG 12 hr tablet  Commonly known as:  MUCINEX  Take 600 mg by mouth 2 (two) times daily.     metFORMIN 500 MG tablet  Commonly known as:  GLUCOPHAGE  Take 500 mg by mouth 2 (two) times daily with a meal.     omeprazole 20 MG capsule  Commonly known as:  PRILOSEC  Take 1 capsule by mouth daily.     potassium chloride SA  20 MEQ tablet  Commonly known as:  K-DUR,KLOR-CON  TAKE 1 TABLET BY MOUTH TWICE DAILY.     pravastatin 20 MG tablet  Commonly known as:  PRAVACHOL  Take 20 mg by mouth daily.     traMADol 50 MG tablet  Commonly known as:  ULTRAM  Take 50 mg by mouth every 12 (twelve) hours.     triamcinolone cream 0.1 %  Commonly known as:  KENALOG  Apply 1 application topically daily. COMPOUND 1:1 GNF:[id number]:EUCERIN

## 2015-01-11 MED ORDER — PREDNISONE 5 MG PO TABS: 5.0000 mg | ORAL_TABLET | Freq: Every day | ORAL | Status: AC

## 2015-01-11 NOTE — Telephone Encounter (Signed)
Ok to resume maintenance prednisone 5 mg, 1 daily, # 30, ref x 4

## 2015-01-11 NOTE — Telephone Encounter (Signed)
Spoke with Marylene LandAngela. Let her know that once CY signs rx we will fax over. Florentina AddisonKatie will fax once it's signed.

## 2015-01-17 ENCOUNTER — Telehealth: Payer: Self-pay | Admitting: Orthopedic Surgery

## 2015-01-17 ENCOUNTER — Ambulatory Visit (HOSPITAL_COMMUNITY)
Admission: RE | Admit: 2015-01-17 | Discharge: 2015-01-17 | Disposition: A | Discharge status: Home / Self Care | Payer: Medicare Other | Source: Ambulatory Visit | Attending: Orthopedic Surgery | Admitting: Orthopedic Surgery

## 2015-01-17 ENCOUNTER — Ambulatory Visit (INDEPENDENT_AMBULATORY_CARE_PROVIDER_SITE_OTHER): Payer: Medicare Other | Admitting: Orthopedic Surgery

## 2015-01-17 ENCOUNTER — Encounter: Payer: Self-pay | Admitting: Orthopedic Surgery

## 2015-01-17 ENCOUNTER — Other Ambulatory Visit: Payer: Self-pay | Admitting: Orthopedic Surgery

## 2015-01-17 VITALS — BP 112/55 | Ht 60.0 in | Wt 139.0 lb

## 2015-01-17 DIAGNOSIS — M5136 Other intervertebral disc degeneration, lumbar region: Secondary | ICD-10-CM | POA: Diagnosis not present

## 2015-01-17 DIAGNOSIS — M25462 Effusion, left knee: Secondary | ICD-10-CM | POA: Insufficient documentation

## 2015-01-17 DIAGNOSIS — M48062 Spinal stenosis, lumbar region with neurogenic claudication: Secondary | ICD-10-CM

## 2015-01-17 DIAGNOSIS — M1712 Unilateral primary osteoarthritis, left knee: Secondary | ICD-10-CM

## 2015-01-17 DIAGNOSIS — M25561 Pain in right knee: Secondary | ICD-10-CM | POA: Insufficient documentation

## 2015-01-17 DIAGNOSIS — M25562 Pain in left knee: Secondary | ICD-10-CM | POA: Insufficient documentation

## 2015-01-17 DIAGNOSIS — R296 Repeated falls: Secondary | ICD-10-CM | POA: Diagnosis not present

## 2015-01-17 DIAGNOSIS — M545 Low back pain: Secondary | ICD-10-CM | POA: Diagnosis not present

## 2015-01-17 DIAGNOSIS — M4806 Spinal stenosis, lumbar region: Secondary | ICD-10-CM

## 2015-01-17 DIAGNOSIS — M549 Dorsalgia, unspecified: Secondary | ICD-10-CM

## 2015-01-17 DIAGNOSIS — M2341 Loose body in knee, right knee: Secondary | ICD-10-CM | POA: Insufficient documentation

## 2015-01-17 DIAGNOSIS — M4856XA Collapsed vertebra, not elsewhere classified, lumbar region, initial encounter for fracture: Secondary | ICD-10-CM | POA: Diagnosis not present

## 2015-01-17 DIAGNOSIS — M1711 Unilateral primary osteoarthritis, right knee: Secondary | ICD-10-CM

## 2015-01-17 DIAGNOSIS — M419 Scoliosis, unspecified: Secondary | ICD-10-CM | POA: Insufficient documentation

## 2015-01-17 NOTE — Progress Notes (Signed)
New patient last seen 2012  Patient ID: [facility], female   DOB: 11-Feb-1921, 81 y.o.   MRN: [id number]  Chief Complaint  Patient presents with  . Knee Pain    Bilateral knee pain, no injury.     Car[facility] Heath is a 81 y.o. female.   HPI  This 81 year old female presents with bilateral knee pain left greater than right she was seen back in 2012 and I diagnosed her with spinal stenosis scoliotic kyphosis and degenerative disc disease. She presents now with complaints of progressive weakness and difficulty walking. She is required use of a walker over the last 6 months to a year. She complains of bilateral knee pain left and right. She says she has gotten injections in the past I don't seen in our records from 2012 going forward.  She says she is not interested in knee replacement. Review of Systems  she really has multiple complaints in terms of her overall disease process. She has back pain she has leg pain she has bilateral knee pain she has multiple joint pain she has changes in the skin with redness of the lower legs with some weeping of the left leg from chronic probably venous congestive disease in the lower extremities to name a few.  Past Medical History  Diagnosis Date  . DM type 2 (diabetes mellitus, type 2)   . Bronchitis   . COPD (chronic obstructive pulmonary disease)   . Hyperlipidemia   . HTN (hypertension)   . Neuropathy   . Chronic a-fib   . Diabetes mellitus with neuropathy   . High cholesterol     Past Surgical History  Procedure Laterality Date  . Thyroidectomy, partial      remote  . Tonsillectomy    . Appendectomy    . Cholecystectomy    . Total abdominal hysterectomy    . Bladder surgery      Family History  Problem Relation Age of Onset  . Heart disease Father   . Heart disease Brother     CABG  . Alzheimer's disease Mother     Social History History  Substance Use Topics  . Smoking status: Former Smoker -- 0.50 packs/day    Types:  Cigarettes    Start date: 07/11/1944    Quit date: 11/11/2006  . Smokeless tobacco: Former Neurosurgeon  . Alcohol Use: No    No Known Allergies  Current Outpatient Prescriptions  Medication Sig Dispense Refill  . acetaminophen (TYLENOL) 325 MG tablet Take 650 mg by mouth 3 (three) times daily.    Marland Kitchen albuterol (PROAIR HFA) 108 (90 BASE) MCG/ACT inhaler Inhale 2 puffs into the lungs every 4 (four) hours as needed for wheezing or shortness of breath. 8.5 g 11  . benzonatate (TESSALON PERLES) 100 MG capsule Take 1 capsule (100 mg total) by mouth 3 (three) times daily as needed for cough. (Patient not taking: Reported on 01/06/2015) 30 capsule 1  . CALCIUM CITRATE PO Take 2 tablets by mouth daily.    . dabigatran (PRADAXA) 75 MG CAPS Take 1 capsule (75 mg total) by mouth every 12 (twelve) hours. 180 capsule 1  . diltiazem (CARDIZEM SR) 60 MG 12 hr capsule Take 1 capsule (60 mg total) by mouth every 12 (twelve) hours. 60 capsule 0  . furosemide (LASIX) 40 MG tablet Take 1 tablet (40 mg total) by mouth 2 (two) times daily. 30 tablet 0  . gabapentin (NEURONTIN) 600 MG tablet Take 600 mg by mouth 4 (four)  times daily.    Marland Kitchen guaiFENesin (MUCINEX) 600 MG 12 hr tablet Take 600 mg by mouth 2 (two) times daily.    . metFORMIN (GLUCOPHAGE) 500 MG tablet Take 500 mg by mouth 2 (two) times daily with a meal.      . Multiple Vitamins-Minerals (ABC PLUS SENIOR) TABS Take 1 tablet by mouth daily.    Marland Kitchen omeprazole (PRILOSEC) 20 MG capsule Take 1 capsule by mouth daily.    . potassium chloride SA (K-DUR,KLOR-CON) 20 MEQ tablet TAKE 1 TABLET BY MOUTH TWICE DAILY. 30 tablet 3  . pravastatin (PRAVACHOL) 20 MG tablet Take 20 mg by mouth daily.    . predniSONE (DELTASONE) 5 MG tablet Take 1 tablet (5 mg total) by mouth daily with breakfast. 30 tablet 4  . Respiratory Therapy Supplies (FLUTTER) DEVI Blow through 4 times, and do this 3 times daily when needed 1 each 0  . traMADol (ULTRAM) 50 MG tablet Take 50 mg by mouth every  12 (twelve) hours.     . triamcinolone cream (KENALOG) 0.1 % Apply 1 application topically daily. COMPOUND 1:1 ZOX:[id number]     No current facility-administered medications for this visit.       Physical Exam Blood pressure 112/55, height 5' (1.524 m), weight 139 lb (63.05 kg). Physical Exam The patient is well developed well nourished and well groomed. Orientation to person place and time is normal  Mood is pleasant.   fortunately her upper extremities show no malalignment issues. Shoulder elbow wrist range of motion is within functional limits. No joints are subluxed or dislocated. Muscle tone is normal skin is intact.  Ambulatory status  She is using a rolling walker for ambulation  right knee examination shows that she actually has very good motion in the knee 125 the knee is stable she does have joint line tenderness motor exam is normal scans intact she has good distal sensation she has redness to suggest venous stasis disease and mild edema   left knee shows medial and lateral joint line tenderness excellent flexion for age at 125 slight flexion contracture knee is stable motor exam is normal scans intact she has a weeping wound with some redness of the lower extremity secondary to venous stasis disease no sensory deficits,  Pulses are intact  Data Reviewed  independent x-ray interpretation she has severe disease of both knees and she has degeneration and lumbar disc disease with scoliosis. The left knee is in varus knee the right knee is a valgus knee and they're both grade 4 disease    the radiologist called [place] about a new mild L2 compression fracture with mild retropulsion based on an x-ray taken February 26. We will address this with appropriate follow-up.  Assessment Encounter Diagnoses  Name Primary?  . Spinal stenosis, lumbar region, with neurogenic claudication   . Primary osteoarthritis of left knee Yes  . Primary osteoarthritis of right knee     she does not want  to have surgery so there is not much I can really do for her. Her knees are bone to bone with invagination of the femur into the tibia so injections are likely not helpful. She did not want to get them if I didn't think they would help so she  Didn't have any injections.  We did dress the wound which had clear fluid no signs of infection   recommend chronic pain management for her  Plan  no follow-up   L-spine x-rays IMPRESSION: 1. New, mild L2 compression fracture  with mild retropulsion. 2. Lumbar scoliosis and diffuse disc degeneration. These results will be called to the ordering clinician or representative by the Radiologist Assistant, and communication documented in the PACS or zVision Dashboard.

## 2015-01-17 NOTE — Telephone Encounter (Signed)
[facility] 3:50pm [facility], Erskine SquibbJane, requesting to speak with doctor or nurse to relay results.  I spoke with nurse, states they have the results, as patient was seen in our office this afternoon, [facility].  Erskine SquibbJane relayed that "these results were just read" and mentioned a fracture of the L-spine - please call back at ph#[phone]. ([facility],ph#[phone])

## 2015-01-19 ENCOUNTER — Telehealth: Payer: Self-pay | Admitting: *Deleted

## 2015-01-19 ENCOUNTER — Telehealth: Payer: Self-pay | Admitting: Internal Medicine

## 2015-01-19 ENCOUNTER — Other Ambulatory Visit: Payer: Self-pay | Admitting: Internal Medicine

## 2015-01-19 NOTE — Telephone Encounter (Signed)
1 + edema in both legs, weight is 134.4, 2 liters of O2 and at 90 %.

## 2015-01-19 NOTE — Telephone Encounter (Signed)
Offer benzonatate perles 100 mg, # 30, 1 evey 8 hours if needed for cough

## 2015-01-19 NOTE — Telephone Encounter (Signed)
Have take Lasix 20 mg extra dose today,

## 2015-01-19 NOTE — Telephone Encounter (Signed)
Please advise 

## 2015-01-19 NOTE — Telephone Encounter (Signed)
[facility]omi [facility] and nurse voiced understanding. Order [facility] Las[facility] 20 mg extra dose [facility]

## 2015-01-19 NOTE — Telephone Encounter (Signed)
Spoke with Marylene LandAngela, [facility].  She reports pt has non prod cough. Increased sob with any activity, wheezing.  Denies fever.  Using Albuterol inh at bedside occas.  Taking Prednisone 5 mg/day.  Please advise.  No Known Allergies  Current Outpatient Prescriptions on File Prior to Visit  Medication Sig Dispense Refill  . acetaminophen (TYLENOL) 325 MG tablet Take 650 mg by mouth 3 (three) times daily.    Marland Kitchen. albuterol (PROAIR HFA) 108 (90 BASE) MCG/ACT inhaler Inhale 2 puffs into the lungs every 4 (four) hours as needed for wheezing or shortness of breath. 8.5 g 11  . benzonatate (TESSALON PERLES) 100 MG capsule Take 1 capsule (100 mg total) by mouth 3 (three) times daily as needed for cough. (Patient not taking: Reported on 01/06/2015) 30 capsule 1  . CALCIUM CITRATE PO Take 2 tablets by mouth daily.    . dabigatran (PRADAXA) 75 MG CAPS Take 1 capsule (75 mg total) by mouth every 12 (twelve) hours. 180 capsule 1  . diltiazem (CARDIZEM SR) 60 MG 12 hr capsule Take 1 capsule (60 mg total) by mouth every 12 (twelve) hours. 60 capsule 0  . furosemide (LASIX) 40 MG tablet Take 1 tablet (40 mg total) by mouth 2 (two) times daily. 30 tablet 0  . gabapentin (NEURONTIN) 600 MG tablet Take 600 mg by mouth 4 (four) times daily.    Marland Kitchen. guaiFENesin (MUCINEX) 600 MG 12 hr tablet Take 600 mg by mouth 2 (two) times daily.    . metFORMIN (GLUCOPHAGE) 500 MG tablet Take 500 mg by mouth 2 (two) times daily with a meal.      . Multiple Vitamins-Minerals (ABC PLUS SENIOR) TABS Take 1 tablet by mouth daily.    Marland Kitchen. omeprazole (PRILOSEC) 20 MG capsule Take 1 capsule by mouth daily.    . potassium chloride SA (K-DUR,KLOR-CON) 20 MEQ tablet TAKE 1 TABLET BY MOUTH TWICE DAILY. 30 tablet 3  . pravastatin (PRAVACHOL) 20 MG tablet Take 20 mg by mouth daily.    . predniSONE (DELTASONE) 5 MG tablet Take 1 tablet (5 mg total) by mouth daily with breakfast. 30 tablet 4  . Respiratory Therapy Supplies (FLUTTER) DEVI Blow  through 4 times, and do this 3 times daily when needed 1 each 0  . traMADol (ULTRAM) 50 MG tablet Take 50 mg by mouth every 12 (twelve) hours.     . triamcinolone cream (KENALOG) 0.1 % Apply 1 application topically daily. COMPOUND 1:1 ZOX:[id number]:EUCERIN     No current facility-administered medications on file prior to visit.

## 2015-01-19 NOTE — Telephone Encounter (Signed)
Called [facility] and spoke with [name]  Order for the benzonatate was faxed to her at [phone]  Nothing further needed

## 2015-01-20 NOTE — Telephone Encounter (Signed)
Per Florentina AddisonKatie request denied and should be sent to Cardiology. See last office note.

## 2015-01-24 ENCOUNTER — Telehealth: Payer: Self-pay | Admitting: *Deleted

## 2015-01-24 NOTE — Telephone Encounter (Signed)
nicole aware 

## 2015-01-24 NOTE — Telephone Encounter (Signed)
Nurse at facility calls and states that the Diclofenac gel flags possible interaction with the Pradaxa... States patient has been taking since 10th with no problem Do you want to to continue

## 2015-01-24 NOTE — Telephone Encounter (Signed)
Yes

## 2015-01-30 ENCOUNTER — Ambulatory Visit: Payer: Medicare Other | Admitting: Internal Medicine

## 2015-01-31 ENCOUNTER — Encounter: Payer: Self-pay | Admitting: Internal Medicine

## 2015-01-31 ENCOUNTER — Ambulatory Visit (INDEPENDENT_AMBULATORY_CARE_PROVIDER_SITE_OTHER): Payer: Medicare Other | Admitting: Internal Medicine

## 2015-01-31 VITALS — BP 128/82 | HR 59 | Ht 62.0 in | Wt 137.2 lb

## 2015-01-31 DIAGNOSIS — I5032 Chronic diastolic (congestive) heart failure: Secondary | ICD-10-CM

## 2015-01-31 DIAGNOSIS — J441 Chronic obstructive pulmonary disease with (acute) exacerbation: Secondary | ICD-10-CM | POA: Diagnosis not present

## 2015-01-31 DIAGNOSIS — I5033 Acute on chronic diastolic (congestive) heart failure: Secondary | ICD-10-CM

## 2015-01-31 DIAGNOSIS — J449 Chronic obstructive pulmonary disease, unspecified: Secondary | ICD-10-CM

## 2015-01-31 DIAGNOSIS — J438 Other emphysema: Secondary | ICD-10-CM | POA: Diagnosis not present

## 2015-01-31 NOTE — Patient Instructions (Signed)
Order- DME [employer] evaluate for light portable O2 concentrator for travel outside of home, 2L, dx COPD emphysema, chronic diastolic CHF

## 2015-01-31 NOTE — Progress Notes (Signed)
Patient ID: Carrie Heath, female    DOB: December 16, 1920, 81 y.o.   MRN: [id number]  HPI 04/02/11- [age] yoF followed for COPD and lung nodule, complicated by DM Last here December 05, 2010- note reviewed.  Here with son today, she complains mostly about arthritis pains. She hasn't needed prednisone in past week. Her breathing hasn't missed it. Hasn't needed O2 in a month or two from [facility]. Not needing her nebulizer machine.   07/30/11- 89 yoF followed for COPD and lung nodule, complicated by DM    Daughter here Fairly stable. Children encourage her to continue her Rosalyn GessBrovana and she takes 1/2 of a 1 mg prednisone tab. Tried off this summer and remembers restarting it because of cough and wheeze. She is vague about whether she has prednisone 1 vs 5 mg pills.  Still notes some wheeze.  CXR 05/30/2010- stable mild COPD, NAD, no nodules seen  01/27/12- 89 yoF followed for COPD and lung nodule, complicated by DM   Son here FOLLOWS FOR: reports still having some SOB occasionally, chest congestion and prod cough with foamy clear/cream colored mucus - but reports has not used the oxygen at home.  not using brovana daily Doesn't hear herself wheeze. Uses her nebulizer with Brovana about once a day. Uses pro air off-and-on, 0 to twice daily. Room air cleaner. Turned her oxygen back in because she wasn't using it. Daily prednisone now at 3 mg. Mostly notices exertional dyspnea when up for ADLs like bathroom. Wants print prescriptions for her medications, planning 4-6 week trip to [place].  09/14/12- 89 yoF followed for COPD and lung nodule, complicated by DM, AFib                  Son here SOB and wheezing at times; cough mostly when congested and before/after Brovana tx. Has had flu vaccine. Has seen Dr. Daleen SquibbWall for management of atrial fibrillation. Continues prednisone 2.5 mg daily for COPD. Without that, she gets wheeze and chest congestion. Using neb brovana once or twice daily with rare need for  rescue inhaler. No longer has oxygen at home. COPD assessment test (CAT) score 21/40  03/14/13- 89 yoF followed for COPD and lung nodule, complicated by DM, AFib                  Son here FOLLOWS FOR: not able to get brovana neb rx; will need ALL rx's refilled today as she is going out of state.  Son is here Rosalyn GessBrovana is too expensive. We discussed her medications and especially her nebulizer meds. She is going to [place] again this summer. Knees limit her walking. Little cough or phlegm now. CXR 01/28/12 IMPRESSION:  Chronic bronchitic changes.  Original Report Authenticated By: Lollie MarrowMARK A. BOLES, M.D.  09/13/13- 89 yoF followed for COPD and lung nodule, complicated by DM, AFib                  DIL here FOLLOWS FOR: pt states she still has to use rescue inhaler sometimes(BID); has good and bad days. Cough at night that sounds wet.  Cough and wheeze w/ weather change. Walks at home w/ walker and cane. Daily prednisone 1/2 x 5  Mg Using neb ipratropium 2-3 x/ day. Not using Perforomist or Brovana- too expensive.  Son needs FMLA  03/14/14- 89 yoF followed for COPD and lung nodule, complicated by DM, AFib                 Son here  FOLLOWS FOR:  Pt now [facility] in [place].  Reports increased sob, cough with yellow mucus, rattling and wheezing 2-3 weeks.  Discuss pred and other meds today She is now living in [facility] in [place]. She manages her own inhalers and nebulizer. She had been off of maintenance prednisone and they recently restarted at 1 mg daily. G.radually increasing chest congestion and some yellow sputum. CXR 09/14/13 IMPRESSION:  Minimal atelectasis at the lung bases laterally. No visible  pulmonary nodules.  Electronically Signed  By: Geanie CooleyJim Maxwell M.D.  On: 09/13/2013 14:39  04/25/14- 89 yoF followed for COPD and lung nodule, complicated by DM, AFib                 Son here FOLLOWS FOR: pt states her breathing is stable.  Denies any cough/ congestion, sob, wheezing.    Now in assisted living and doing very well. Off prednisone. Occasional rescue inhaler. Not needing her nebulizer.  11/01/14 Post Hospital follow up  Was recently admitted 12/5 for acute CHF , afib with RVR and AB exacerbation. tx for rate control with diltiazem. Had aggressive diuresis w/ improvement .  tx w/ iv abx and steroids. Along with oxygen. D/c on O2. , pred taper and Zpak.  She remains very weak. Cough is some better.  No fever , chest pain, increased leg swelling.  Caregiver present with pt along with telephone conference with pts son.   12/13/14-  89 yoF followed for COPD and lung nodule, complicated by DM, AFib                 Aide from nursing home here ACUTE VISIT: breathing is doing well; continues to sleep with O2. Pt is having increased swelling in legs and weeping as well.  Post Hosp 1/5-11/18/14   DM2, AFib, CHF, HBP  discharge weight 145 pounds She complains of weeping fluid from edematous legs especially since yesterday. Edema worse since leaving the hospital. Lasix had been increased from 40 mg daily to 40 mg twice daily. She has elastic hose, too tight.  01/31/15- 89 yoF followed for COPD and lung nodule, complicated by DM, AFib                son is here from [place] FOLLOWS FOR: pt is doing well being on Lasix BID for swelling in legs and feet at prior visit. Continues to wear O2 through [facility] at night. Outside lab K 3.9 12/28/14 She uses oxygen at night and only occasionally in the daytime. Son asked about getting her a portable oxygen concentrator. We talked about guidelines for portable oxygen.  Review of Systems-see HPI Constitutional:   No weight loss, night sweats,  Fevers, chills, +fatigue, lassitude. HEENT:   No headaches,  Difficulty swallowing,  Tooth/dental problems,  Sore throat,                No sneezing, itching, ear ache, nasal congestion, post nasal drip,  CV:  No chest pain, orthopnea, PND, swelling in lower extremities, anasarca, dizziness,  +palpitations GI  No heartburn, indigestion, abdominal pain, nausea, vomiting,  Resp: + shortness of breath with exertion or at rest.  No excess mucus, no- productive cough, + non-productive cough,  No- coughing up of blood.  No-change in color of mucus.  Occasional   wheezing.  Skin: no rash or lesions. GU: no dysuria, . MS: No acute pain. Psych:  No change in mood or affect. No depression or anxiety.  No memory loss.  Objective:  General- Alert, Oriented, Affect-appropriate, Distress- none acute. +Wheelchair Skin- rash-none, lesions- none, excoriation- none Lymphadenopathy- none Head- atraumatic            Eyes- Gross vision intact, PERRLA, conjunctivae clear secretions            Ears- Hearing, canals normal for age            Nose- Clear, no-Septal dev, mucus, polyps, erosion, perforation             Throat- Mallampati II , mucosa clear , drainage- none, tonsils- atrophic Neck- flexible , trachea midline, no stridor , thyroid nl, carotid no bruit Chest - symmetrical excursion , unlabored           Heart/CV- irreg , no murmur , no gallop  , no rub, nl s1 s2                         - JVD-none , edema+1, stasis changes- none, varices- none,                                          Lung- +no rales, cough- none , dullness-none, rub- none           Chest wall-  Abd-  Br/ Gen/ Rectal- Not done, not indicated Extrem- cyanosis- none, clubbing, none, atrophy- none, strength- nl Neuro- grossly intact to observation

## 2015-01-31 NOTE — Assessment & Plan Note (Signed)
Leg edema is much better. She is tolerating Lasix with potassium supplement

## 2015-01-31 NOTE — Assessment & Plan Note (Signed)
Much better control now. Dyspnea is from COPD, her diastolic heart failure and debilitation with limitation by leg weakness. She will ask [employer] if she qualifies for light portable oxygen concentrator

## 2015-02-01 ENCOUNTER — Other Ambulatory Visit: Payer: Self-pay | Admitting: Cardiovascular Disease

## 2015-08-07 ENCOUNTER — Ambulatory Visit (INDEPENDENT_AMBULATORY_CARE_PROVIDER_SITE_OTHER): Payer: Medicare Other | Admitting: Internal Medicine

## 2015-08-07 ENCOUNTER — Encounter: Payer: Self-pay | Admitting: Internal Medicine

## 2015-08-07 VITALS — BP 114/70 | HR 82 | Ht 62.0 in | Wt 146.8 lb

## 2015-08-07 DIAGNOSIS — I4891 Unspecified atrial fibrillation: Secondary | ICD-10-CM

## 2015-08-07 DIAGNOSIS — J449 Chronic obstructive pulmonary disease, unspecified: Secondary | ICD-10-CM | POA: Diagnosis not present

## 2015-08-07 DIAGNOSIS — R29898 Other symptoms and signs involving the musculoskeletal system: Secondary | ICD-10-CM

## 2015-08-07 NOTE — Patient Instructions (Addendum)
Ok to use your oxygen 2L/ [employer] when needed  Ok to only use your Proair rescue inhaler 2 puffs every 6 hours if needed for shortness of breath or wheezing.  You will get your flu shot at [facility]

## 2015-08-07 NOTE — Progress Notes (Signed)
Patient ID: Carrie Heath, female    DOB: December 16, 1920, 81 y.o.   MRN: [id number]  HPI 04/02/11- [age] yoF followed for COPD and lung nodule, complicated by DM Last here December 05, 2010- note reviewed.  Here with son today, she complains mostly about arthritis pains. She hasn't needed prednisone in past week. Her breathing hasn't missed it. Hasn't needed O2 in a month or two from [facility]. Not needing her nebulizer machine.   07/30/11- 89 yoF followed for COPD and lung nodule, complicated by DM    Daughter here Fairly stable. Children encourage her to continue her Rosalyn GessBrovana and she takes 1/2 of a 1 mg prednisone tab. Tried off this summer and remembers restarting it because of cough and wheeze. She is vague about whether she has prednisone 1 vs 5 mg pills.  Still notes some wheeze.  CXR 05/30/2010- stable mild COPD, NAD, no nodules seen  01/27/12- 89 yoF followed for COPD and lung nodule, complicated by DM   Son here FOLLOWS FOR: reports still having some SOB occasionally, chest congestion and prod cough with foamy clear/cream colored mucus - but reports has not used the oxygen at home.  not using brovana daily Doesn't hear herself wheeze. Uses her nebulizer with Brovana about once a day. Uses pro air off-and-on, 0 to twice daily. Room air cleaner. Turned her oxygen back in because she wasn't using it. Daily prednisone now at 3 mg. Mostly notices exertional dyspnea when up for ADLs like bathroom. Wants print prescriptions for her medications, planning 4-6 week trip to [place].  09/14/12- 89 yoF followed for COPD and lung nodule, complicated by DM, AFib                  Son here SOB and wheezing at times; cough mostly when congested and before/after Brovana tx. Has had flu vaccine. Has seen Dr. Daleen SquibbWall for management of atrial fibrillation. Continues prednisone 2.5 mg daily for COPD. Without that, she gets wheeze and chest congestion. Using neb brovana once or twice daily with rare need for  rescue inhaler. No longer has oxygen at home. COPD assessment test (CAT) score 21/40  03/14/13- 89 yoF followed for COPD and lung nodule, complicated by DM, AFib                  Son here FOLLOWS FOR: not able to get brovana neb rx; will need ALL rx's refilled today as she is going out of state.  Son is here Rosalyn GessBrovana is too expensive. We discussed her medications and especially her nebulizer meds. She is going to [place] again this summer. Knees limit her walking. Little cough or phlegm now. CXR 01/28/12 IMPRESSION:  Chronic bronchitic changes.  Original Report Authenticated By: Lollie MarrowMARK A. BOLES, M.D.  09/13/13- 89 yoF followed for COPD and lung nodule, complicated by DM, AFib                  DIL here FOLLOWS FOR: pt states she still has to use rescue inhaler sometimes(BID); has good and bad days. Cough at night that sounds wet.  Cough and wheeze w/ weather change. Walks at home w/ walker and cane. Daily prednisone 1/2 x 5  Mg Using neb ipratropium 2-3 x/ day. Not using Perforomist or Brovana- too expensive.  Son needs FMLA  03/14/14- 89 yoF followed for COPD and lung nodule, complicated by DM, AFib                 Son here  FOLLOWS FOR:  [name].  Reports increased sob, cough with yellow mucus, rattling and wheezing 2-3 weeks.  Discuss pred and other meds today She is now living in [facility] in [place]. She manages her own inhalers and nebulizer. She had been off of maintenance prednisone and they recently restarted at 1 mg daily. G.radually increasing chest congestion and some yellow sputum. CXR 09/14/13 IMPRESSION:  Minimal atelectasis at the lung bases laterally. No visible  pulmonary nodules.  Electronically Signed  By: Geanie Cooley M.D.  On: 09/13/2013 14:39  04/25/14- 89 yoF followed for COPD and lung nodule, complicated by DM, AFib                 Son here FOLLOWS FOR: pt states her breathing is stable.  Denies any cough/ congestion, sob, wheezing.    Now in assisted living and doing very well. Off prednisone. Occasional rescue inhaler. Not needing her nebulizer.  11/01/14 Post Hospital follow up  Was recently admitted 12/5 for acute CHF , afib with RVR and AB exacerbation. tx for rate control with diltiazem. Had aggressive diuresis w/ improvement .  tx w/ iv abx and steroids. Along with oxygen. D/c on O2. , pred taper and Zpak.  She remains very weak. Cough is some better.  No fever , chest pain, increased leg swelling.  Caregiver present with pt along with telephone conference with pts son.   12/13/14-  89 yoF followed for COPD and lung nodule, complicated by DM, AFib                 Aide from nursing home here ACUTE VISIT: breathing is doing well; continues to sleep with O2. Pt is having increased swelling in legs and weeping as well.  Post Hosp 1/5-11/18/14   DM2, AFib, CHF, HBP  discharge weight 145 pounds She complains of weeping fluid from edematous legs especially since yesterday. Edema worse since leaving the hospital. Lasix had been increased from 40 mg daily to 40 mg twice daily. She has elastic hose, too tight.  01/31/15- 89 yoF followed for COPD and lung nodule, complicated by DM, AFib                son is here from [place] FOLLOWS FOR: pt is doing well being on Lasix BID for swelling in legs and feet at prior visit. Continues to wear O2 through [facility] at night. Outside lab K 3.9 12/28/14 She uses oxygen at night and only occasionally in the daytime. Son asked about getting her a portable oxygen concentrator. We talked about guidelines for portable oxygen.  08/07/15- 89 yoF former smoker followed for COPD and lung nodule, complicated by DM, AFib  O2 2L/ [facility]- prn            Son here                 She now lives at [facility] flu vax there FOLLOWS FOR:Pt states breathing has been well overall; son states patient had issues-when using concentrator patient had concerns but puts on and uses. Has taken off in middle of  night. Has O2, owns it, uses only prn now.  Had CHF in December Rx'd lasix. Not using Proair or benzonatate. CXR 2/226/16 1V- reviewed FINDINGS: Cardiac silhouette is upper limits of normal size, mediastinal silhouette is nonsuspicious. Mild bronchitic changes without focal consolidation or pleural effusion, improved aeration of the lung bases. No pneumothorax. Osteopenic. Mild degenerative change of the  thoracic spine with thoracolumbar scoliosis. IMPRESSION: Borderline cardiomegaly. Mild bronchitic changes without focal consolidation. Electronically Signed  By: Awilda Metro  On: 01/06/2015 21:41  Review of Systems-seeHPI Constitutional:   No weight loss, night sweats,  Fevers, chills, +fatigue, lassitude. HEENT:   No headaches,  Difficulty swallowing,  Tooth/dental problems,  Sore throat,                No sneezing, itching, ear ache, nasal congestion, post nasal drip,  CV:  No chest pain, orthopnea, PND, swelling in lower extremities, anasarca, dizziness, +palpitations GI  No heartburn, indigestion, abdominal pain, nausea, vomiting,  Resp: + shortness of breath with exertion or at rest.  No excess mucus, no- productive cough,  non-productive cough,  No- coughing up of blood.  No-change in color of mucus.  Occasional   wheezing.  Skin: no rash or lesions. GU: no dysuria, . MS: No acute pain. +Leg weakness Psych:  No change in mood or affect. No depression or anxiety.  No memory loss.  Objective:  General- Alert, Oriented, Affect-appropriate, Distress- none acute. +Wheelchair, +97% sat rest/ room air. Skin- rash-none, lesions- none, excoriation- none Lymphadenopathy- none Head- atraumatic            Eyes- Gross vision intact, PERRLA, conjunctivae clear secretions            Ears- Hearing, canals normal for age            Nose- Clear, no-Septal dev, mucus, polyps, erosion, perforation             Throat- Mallampati II , mucosa clear , drainage- none, tonsils-  atrophic Neck- flexible , trachea midline, no stridor , thyroid nl, carotid no bruit Chest - symmetrical excursion , unlabored           Heart/CV-RRR , no murmur , no gallop  , no rub, nl s1 s2                         - JVD+slight , edema+1, stasis changes- none, varices- none,                                          Lung- +no rales, cough- none , dullness-none, rub- none           Chest wall-  Abd-  Br/ Gen/ Rectal- Not done, not indicated Extrem- cyanosis- none, clubbing, none, atrophy- none, strength- nl Neuro- grossly intact to observation

## 2015-08-08 NOTE — Assessment & Plan Note (Signed)
Confined to wheelchair at this visit. ? Attributed to her spinal stenosis.

## 2015-08-08 NOTE — Assessment & Plan Note (Signed)
Pulse feels very regular at this exam

## 2015-08-08 NOTE — Assessment & Plan Note (Signed)
Clear on this exam. Left O2 at home and using only when she feels need. Good RA sat today. Not needing rescue inhaler. Good control.

## 2015-08-12 ENCOUNTER — Emergency Department (HOSPITAL_COMMUNITY): Payer: Medicare Other

## 2015-08-12 ENCOUNTER — Emergency Department (HOSPITAL_COMMUNITY)
Admission: EM | Admit: 2015-08-12 | Discharge: 2015-08-12 | Disposition: A | Discharge status: Home / Self Care | Payer: Medicare Other | Attending: Emergency Medicine | Admitting: Emergency Medicine

## 2015-08-12 ENCOUNTER — Encounter (HOSPITAL_COMMUNITY): Payer: Self-pay

## 2015-08-12 DIAGNOSIS — Z7952 Long term (current) use of systemic steroids: Secondary | ICD-10-CM | POA: Diagnosis not present

## 2015-08-12 DIAGNOSIS — E7800 Pure hypercholesterolemia, unspecified: Secondary | ICD-10-CM | POA: Diagnosis not present

## 2015-08-12 DIAGNOSIS — S8991XA Unspecified injury of right lower leg, initial encounter: Secondary | ICD-10-CM | POA: Diagnosis not present

## 2015-08-12 DIAGNOSIS — I10 Essential (primary) hypertension: Secondary | ICD-10-CM | POA: Insufficient documentation

## 2015-08-12 DIAGNOSIS — S3992XA Unspecified injury of lower back, initial encounter: Secondary | ICD-10-CM | POA: Insufficient documentation

## 2015-08-12 DIAGNOSIS — Y9301 Activity, walking, marching and hiking: Secondary | ICD-10-CM | POA: Insufficient documentation

## 2015-08-12 DIAGNOSIS — Y9289 Other specified places as the place of occurrence of the external cause: Secondary | ICD-10-CM | POA: Insufficient documentation

## 2015-08-12 DIAGNOSIS — I482 Chronic atrial fibrillation: Secondary | ICD-10-CM | POA: Insufficient documentation

## 2015-08-12 DIAGNOSIS — Y998 Other external cause status: Secondary | ICD-10-CM | POA: Insufficient documentation

## 2015-08-12 DIAGNOSIS — W19XXXA Unspecified fall, initial encounter: Secondary | ICD-10-CM

## 2015-08-12 DIAGNOSIS — Z87891 Personal history of nicotine dependence: Secondary | ICD-10-CM | POA: Diagnosis not present

## 2015-08-12 DIAGNOSIS — Z79899 Other long term (current) drug therapy: Secondary | ICD-10-CM | POA: Insufficient documentation

## 2015-08-12 DIAGNOSIS — E785 Hyperlipidemia, unspecified: Secondary | ICD-10-CM | POA: Diagnosis not present

## 2015-08-12 DIAGNOSIS — J449 Chronic obstructive pulmonary disease, unspecified: Secondary | ICD-10-CM | POA: Insufficient documentation

## 2015-08-12 DIAGNOSIS — Z7901 Long term (current) use of anticoagulants: Secondary | ICD-10-CM | POA: Insufficient documentation

## 2015-08-12 DIAGNOSIS — W01198A Fall on same level from slipping, tripping and stumbling with subsequent striking against other object, initial encounter: Secondary | ICD-10-CM | POA: Insufficient documentation

## 2015-08-12 DIAGNOSIS — E1140 Type 2 diabetes mellitus with diabetic neuropathy, unspecified: Secondary | ICD-10-CM | POA: Diagnosis not present

## 2015-08-12 NOTE — Discharge Instructions (Signed)
Tylenol for pain.  Follow-up as needed 

## 2015-08-12 NOTE — ED Provider Notes (Signed)
CSN: [id number]     Arrival date & time 08/12/15  0865 History   First MD Initiated Contact with Patient 08/12/15 0732     Chief Complaint  Patient presents with  . Fall     (Consider location/radiation/quality/duration/timing/severity/associated sxs/prior Treatment) Patient is a 81 y.o. female presenting with fall. The history is provided by the patient (Patient states that she was walking her knees give out and she fell to the floor patient states that she has mild back pain mild right knee pain she hit her head no loss of consciousness use of that her head hit very mild).  Fall This is a new problem. The current episode started less than 1 hour ago. The problem occurs constantly. The problem has not changed since onset.Pertinent negatives include no chest pain, no abdominal pain and no headaches. Nothing aggravates the symptoms. Nothing relieves the symptoms.    Past Medical History  Diagnosis Date  . DM type 2 (diabetes mellitus, type 2) (HCC)   . Bronchitis   . COPD (chronic obstructive pulmonary disease) (HCC)   . Hyperlipidemia   . Neuropathy (HCC)   . Chronic a-fib (HCC)   . Diabetes mellitus with neuropathy (HCC)   . High cholesterol   . HTN (hypertension)     pt denies   Past Surgical History  Procedure Laterality Date  . Thyroidectomy, partial      remote  . Tonsillectomy    . Appendectomy    . Cholecystectomy    . Total abdominal hysterectomy    . Bladder surgery     Family History  Problem Relation Age of Onset  . Heart disease Father   . Heart disease Brother     CABG  . Alzheimer's disease Mother    Social History  Substance Use Topics  . Smoking status: Former Smoker -- 0.50 packs/day    Types: Cigarettes    Start date: 07/11/1944    Quit date: 11/11/2006  . Smokeless tobacco: Former Neurosurgeon  . Alcohol Use: No   OB History    No data available     Review of Systems  Constitutional: Negative for appetite change and fatigue.  HENT: Negative for  congestion, ear discharge and sinus pressure.   Eyes: Negative for discharge.  Respiratory: Negative for cough.   Cardiovascular: Negative for chest pain.  Gastrointestinal: Negative for abdominal pain and diarrhea.  Genitourinary: Negative for frequency and hematuria.  Musculoskeletal: Positive for back pain.       Right knee pain  Skin: Negative for rash.  Neurological: Negative for seizures and headaches.  Psychiatric/Behavioral: Negative for hallucinations.      Allergies  Review of patient's allergies indicates no known allergies.  Home Medications   Prior to Admission medications   Medication Sig Start Date End Date Taking? Authorizing Provider  acetaminophen (TYLENOL) 325 MG tablet Take 650 mg by mouth 3 (three) times daily.   Yes Historical Provider, MD  albuterol (PROAIR HFA) 108 (90 BASE) MCG/ACT inhaler Inhale 2 puffs into the lungs every 4 (four) hours as needed for wheezing or shortness of breath. 10/15/13 08/12/15 Yes Clinton D Young, MD  benzonatate (TESSALON PERLES) 100 MG capsule Take 1 capsule (100 mg total) by mouth 3 (three) times daily as needed for cough. 11/01/14  Yes Tammy S Parrett, NP  CALCIUM CITRATE PO Take 2 tablets by mouth daily.   Yes Historical Provider, MD  dabigatran (PRADAXA) 75 MG CAPS Take 1 capsule (75 mg total) by mouth every 12 (twelve)  hours. 12/02/12  Yes Gaylord Shih, MD  diltiazem (CARDIZEM SR) 60 MG 12 hr capsule Take 1 capsule (60 mg total) by mouth every 12 (twelve) hours. 11/18/14  Yes Rhetta Mura, MD  docusate sodium (COLACE) 100 MG capsule Take 100 mg by mouth 2 (two) times daily.   Yes Historical Provider, MD  DULoxetine (CYMBALTA) 30 MG capsule Take 1 capsule by mouth daily. 07/26/15  Yes Historical Provider, MD  furosemide (LASIX) 40 MG tablet TAKE (1) TABLET BY MOUTH TWICE DAILY. 02/01/15  Yes Laqueta Linden, MD  gabapentin (NEURONTIN) 600 MG tablet Take 600 mg by mouth 4 (four) times daily.   Yes Historical Provider, MD   guaiFENesin (MUCINEX) 600 MG 12 hr tablet Take 600 mg by mouth 2 (two) times daily.   Yes Historical Provider, MD  metFORMIN (GLUCOPHAGE) 500 MG tablet Take 500 mg by mouth 2 (two) times daily with a meal.     Yes Historical Provider, MD  Multiple Vitamins-Minerals (ABC PLUS SENIOR) TABS Take 1 tablet by mouth daily.   Yes Historical Provider, MD  omeprazole (PRILOSEC) 20 MG capsule Take 1 capsule by mouth daily. 09/10/13  Yes Historical Provider, MD  polyethylene glycol (MIRALAX / GLYCOLAX) packet Take 17 g by mouth daily.   Yes Historical Provider, MD  potassium chloride SA (K-DUR,KLOR-CON) 20 MEQ tablet TAKE 1 TABLET BY MOUTH TWICE DAILY. 01/02/15  Yes Laqueta Linden, MD  pravastatin (PRAVACHOL) 20 MG tablet Take 20 mg by mouth daily. 07/25/14  Yes Historical Provider, MD  predniSONE (DELTASONE) 5 MG tablet Take 1 tablet (5 mg total) by mouth daily with breakfast. 01/11/15  Yes Waymon Budge, MD  Respiratory Therapy Supplies (FLUTTER) DEVI Blow through 4 times, and do this 3 times daily when needed 01/27/12  Yes Waymon Budge, MD  traMADol (ULTRAM) 50 MG tablet Take 50 mg by mouth every 12 (twelve) hours.    Yes Historical Provider, MD   BP 110/63 mmHg  Pulse 66  Temp(Src) 97.9 F (36.6 C) (Oral)  Resp 16  Ht  (1.626 m)  Wt 146 lb (66.225 kg)  BMI 25.05 kg/m2  SpO2 96% Physical Exam  Constitutional: She is oriented to person, place, and time. She appears well-developed.  HENT:  Head: Normocephalic.  Eyes: Conjunctivae and EOM are normal. No scleral icterus.  Neck: Neck supple. No thyromegaly present.  Cardiovascular: Normal rate and regular rhythm.  Exam reveals no gallop and no friction rub.   No murmur heard. Pulmonary/Chest: No stridor. She has no wheezes. She has no rales. She exhibits no tenderness.  Abdominal: She exhibits no distension. There is no tenderness. There is no rebound.  Musculoskeletal: Normal range of motion. She exhibits no edema.  Mild tender right  knee.  Mild lumbar spine tender  Lymphadenopathy:    She has no cervical adenopathy.  Neurological: She is oriented to person, place, and time. She exhibits normal muscle tone. Coordination normal.  Skin: No rash noted. No erythema.  Psychiatric: She has a normal mood and affect. Her behavior is normal.    ED Course  Procedures (including critical care time) Labs Review Labs Reviewed - No data to display  Imaging Review Dg Lumbar Spine Complete  08/12/2015   CLINICAL DATA:  Pain following fall  EXAM: LUMBAR SPINE - COMPLETE 4+ VIEW  COMPARISON:  January 17, 2015  FINDINGS: Frontal, lateral, spot lumbosacral lateral, and bilateral oblique views were obtained. There are 5 non-rib-bearing lumbar type vertebral bodies. There is lumbar  levoscoliosis with rotatory component. There is evidence of a prior fracture along the superior endplate of the L2 vertebral body, stable. No new fracture. No spondylolisthesis. There is disc space narrowing at all levels, stable. There is facet osteoarthritic change at all levels bilaterally.  There is a calcified uterine leiomyoma in the left pelvis, stable. There is atherosclerotic calcification in the aorta. There is diffuse stool throughout the colon. Bones are osteoporotic.  IMPRESSION: Stable arthropathy and scoliosis. Chronic anterior wedging of the superior endplate of the L2 vertebral body. No acute fracture. No spondylolisthesis. Stable calcified leiomyoma left pelvis. Atherosclerotic calcification noted in the aorta. Bones are diffusely osteoporotic. Diffuse stool throughout colon.   Electronically Signed   By: Bretta Bang III M.D.   On: 08/12/2015 08:30   Dg Knee Complete 4 Views Right  08/12/2015   CLINICAL DATA:  Pain following fall  EXAM: RIGHT KNEE - COMPLETE 4+ VIEW  COMPARISON:  January 17, 2015  FINDINGS: Frontal, lateral, and bilateral oblique views were obtained. There is no acute fracture or dislocation. There is a small joint effusion with  calcification in the suprapatellar bursa, a stable finding. There is generalized moderately severe disc space narrowing. There is spurring laterally and arising from the patella posteriorly. Bones are osteoporotic. No erosive change.  IMPRESSION: Extensive osteoarthritic change, stable. Small joint effusion with calcification in the suprapatellar bursa, stable. No acute fracture or dislocation. Bones osteoporotic.   Electronically Signed   By: Bretta Bang III M.D.   On: 08/12/2015 08:32   I have personally reviewed and evaluated these images and lab results as part of my medical decision-making.   EKG Interpretation None      MDM   Final diagnoses:  Fall, initial encounter    Fall with mild contusion lumbar spine. X-rays unremarkable. We'll treat with Tylenol   Bethann Berkshire, MD 08/12/15 1204

## 2015-08-12 NOTE — ED Notes (Signed)
[facility] called to arrange transportation for pt to be transferred back to them. [facility] said they would have someone available to transport in about 20 minutes.

## 2015-08-12 NOTE — ED Notes (Signed)
Pt resident of [facility].  EMS reports pt has problems with r knee and while getting dressed she put all her weight on that knee and it gave away.  Pt fell and has skin tear to left forearm and r side of body.

## 2015-08-12 NOTE — ED Notes (Signed)
Pt brought in by [facility] this morning after she fell attempting to get ready in her bathroom. Pt reports her right knee gave out and she fell onto her right side. Pt reports she hit her head but "it wasn't that bad". Denies LOC. When EMS arrived, pt was reporting right lower back pain and right knee pain. Pt now denies any pain. Pt has skin tear to left forearm from fall. Pt A&O, NAD noted.

## 2015-11-04 IMAGING — DX DG LUMBAR SPINE COMPLETE 4+V
5 series · 5 of 5 positions shown · non-contrast
Comparison: 01/06/2015

CLINICAL DATA: Low back pain and bilateral knee pain with a history
of falls.

EXAM:
LUMBAR SPINE - COMPLETE 4+ VIEW

[l-spine ap]
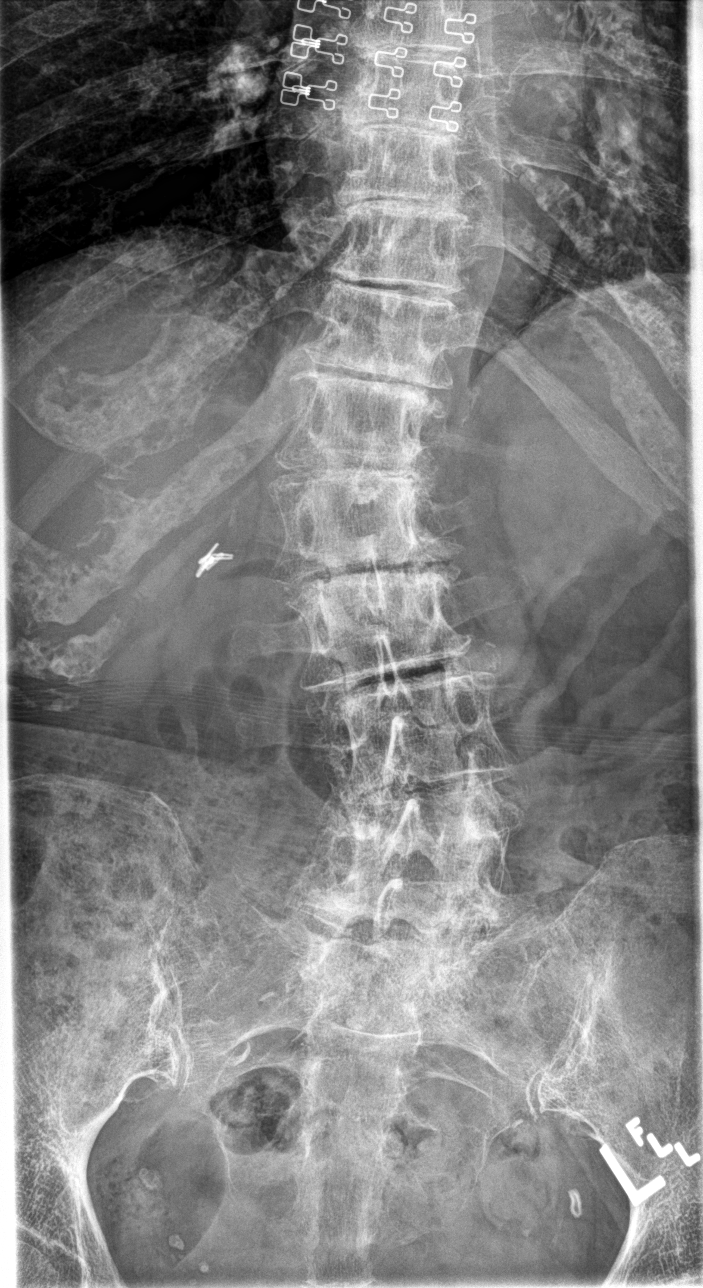

[l-spine obl (1 of 2)]
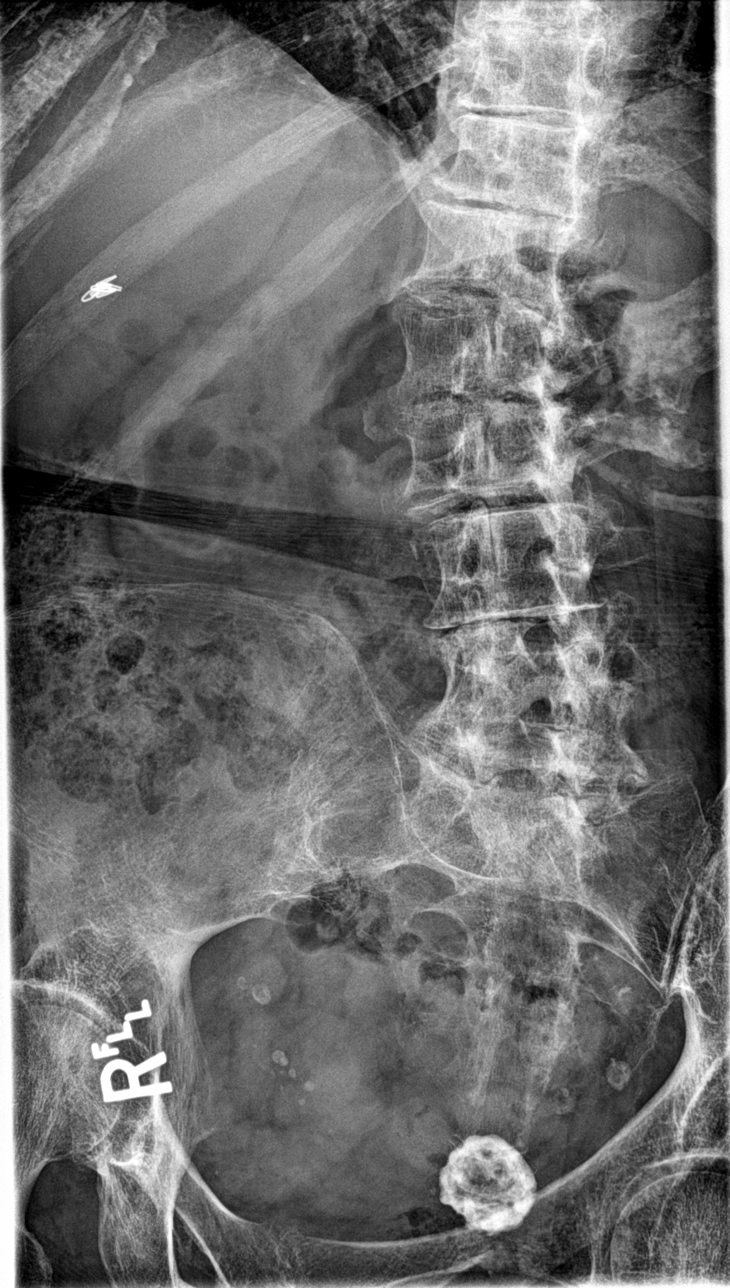

[l-spine obl (2 of 2)]
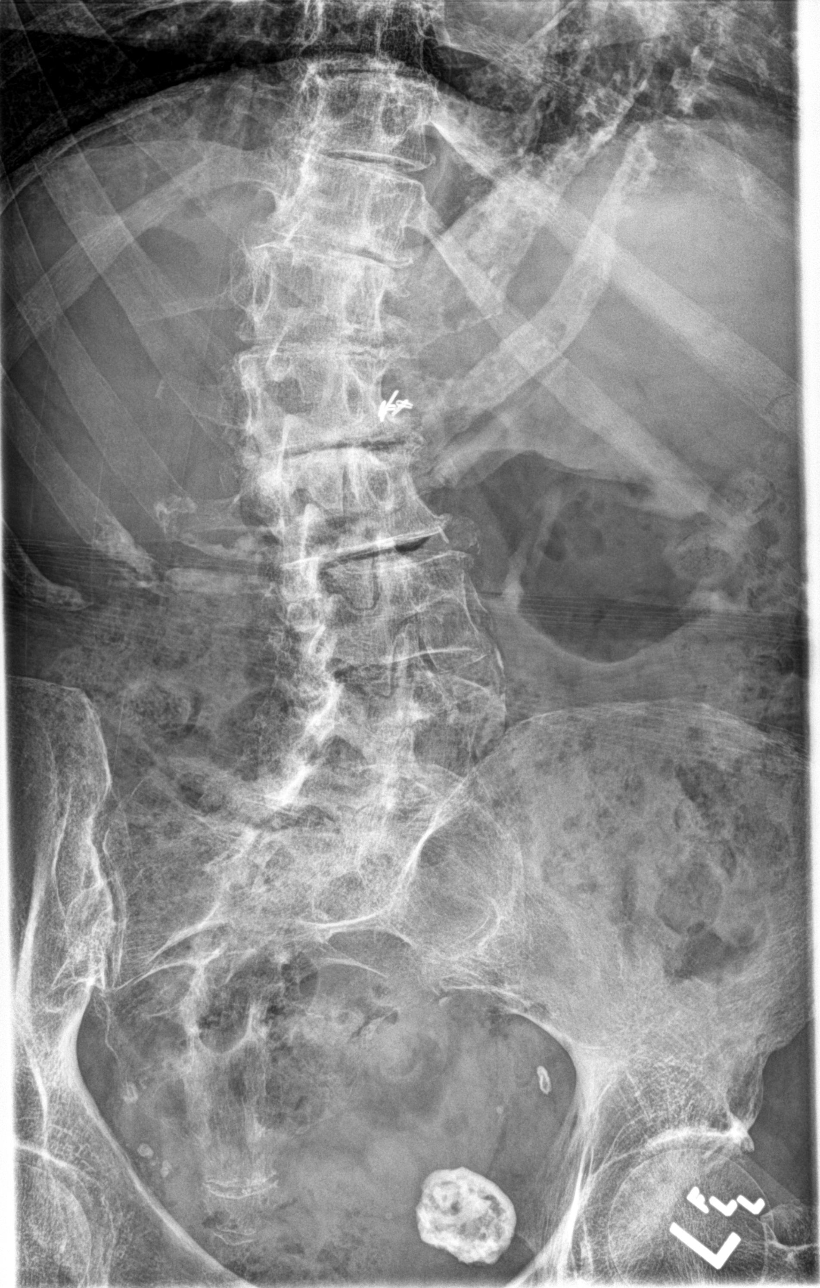

[l-spine lat]
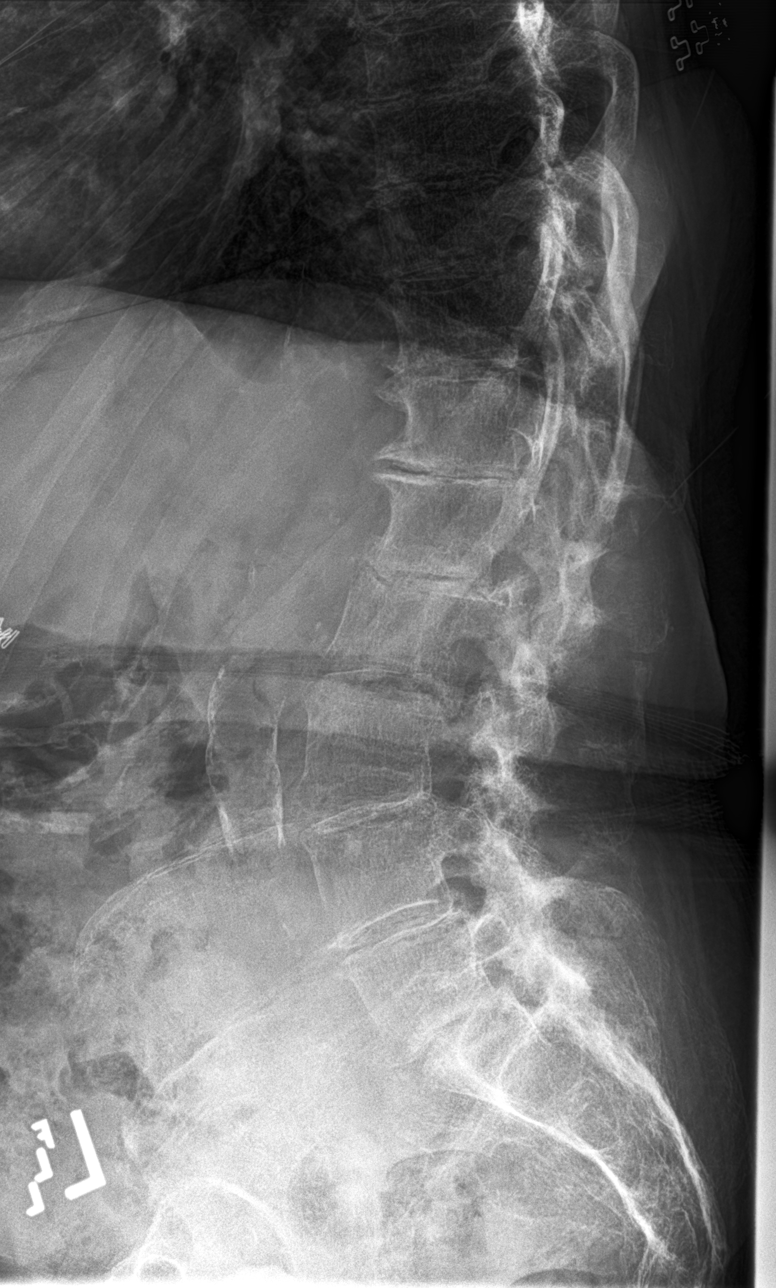

[l-spine spot]
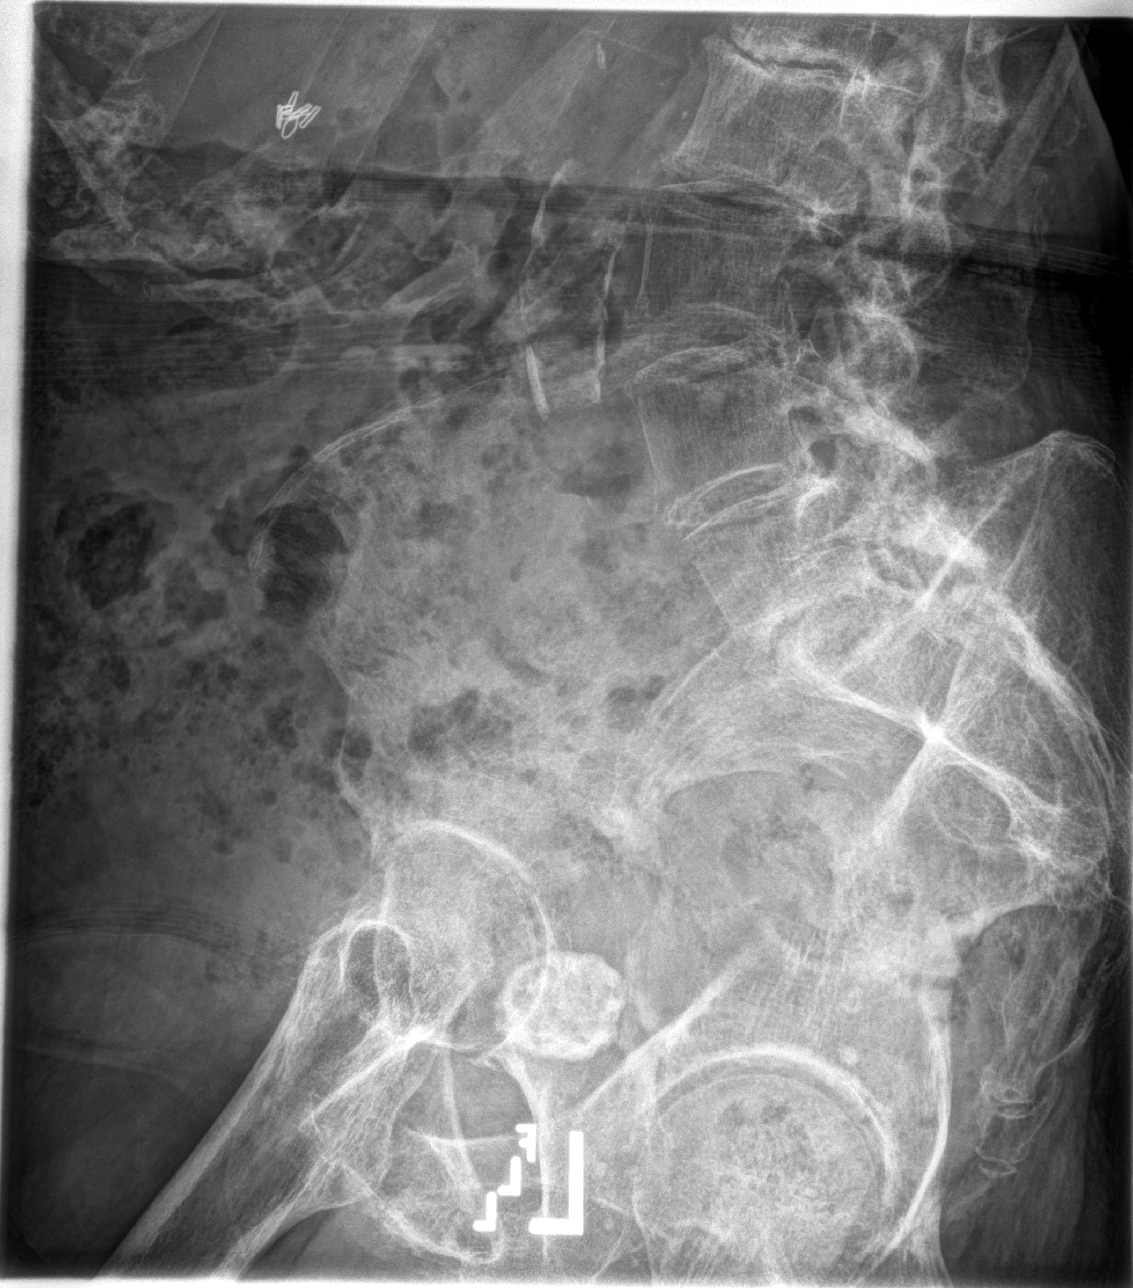

[5 of 5 positions shown; findings below may reference images not displayed]

FINDINGS: Mild-to-moderate thoracolumbar dextroscoliosis and lower lumbar
levoscoliosis do not appear significantly changed. There is a new,
mild L2 compression fracture with a fracture line visible involving
the superior endplate and with evidence of mild retropulsion. There
is slight anterolisthesis of L5 on S1, not significantly changed.
Moderate to severe disc space narrowing is again seen at L1-2, L2-3,
L4-5, and L5-S1. Associated endplate sclerosis and marginal
osteophytosis are noted. The bones appear diffusely osteopenic. No
definite pars defects are identified, although scoliosis partially
limits evaluation.

Atherosclerotic vascular calcification, right upper quadrant
surgical clips, pelvic phleboliths, and a calcified uterine fibroid
are again noted.
IMPRESSION: 1. New, mild L2 compression fracture with mild retropulsion.
2. Lumbar scoliosis and diffuse disc degeneration.
These results will be called to the ordering clinician or
representative by the Radiologist Assistant, and communication
documented in the PACS or zVision Dashboard.

## 2015-12-07 ENCOUNTER — Encounter (INDEPENDENT_AMBULATORY_CARE_PROVIDER_SITE_OTHER): Payer: Self-pay | Admitting: *Deleted

## 2015-12-30 ENCOUNTER — Other Ambulatory Visit (HOSPITAL_COMMUNITY)
Admission: RE | Admit: 2015-12-30 | Discharge: 2015-12-30 | Disposition: A | Discharge status: Home / Self Care | Payer: Medicare Other | Source: Skilled Nursing Facility | Attending: Nephrology | Admitting: Nephrology

## 2015-12-30 DIAGNOSIS — N390 Urinary tract infection, site not specified: Secondary | ICD-10-CM | POA: Insufficient documentation

## 2015-12-30 LAB — URINALYSIS, ROUTINE W REFLEX MICROSCOPIC
Bilirubin Urine: NEGATIVE
Glucose, UA: NEGATIVE mg/dL
Hgb urine dipstick: NEGATIVE
Ketones, ur: NEGATIVE mg/dL
Leukocytes, UA: NEGATIVE
NITRITE: NEGATIVE
PH: 6 (ref 5.0–8.0)
Protein, ur: NEGATIVE mg/dL

## 2016-01-02 ENCOUNTER — Encounter (INDEPENDENT_AMBULATORY_CARE_PROVIDER_SITE_OTHER): Payer: Self-pay | Admitting: Internal Medicine

## 2016-01-02 ENCOUNTER — Ambulatory Visit (INDEPENDENT_AMBULATORY_CARE_PROVIDER_SITE_OTHER): Payer: Medicare Other | Admitting: Internal Medicine

## 2016-01-02 VITALS — BP 100/72 | HR 60 | Temp 98.4°F | Ht 65.0 in | Wt 133.5 lb

## 2016-01-02 DIAGNOSIS — K625 Hemorrhage of anus and rectum: Secondary | ICD-10-CM | POA: Diagnosis not present

## 2016-01-02 LAB — URINE CULTURE: Culture: >=100000

## 2016-01-02 LAB — CBC
HEMATOCRIT: 32.9 % — AB (ref 36.0–46.0)
Hemoglobin: 9.8 g/dL — ABNORMAL LOW (ref 12.0–15.0)
MCH: 25.4 pg — AB (ref 26.0–34.0)
MCHC: 29.8 g/dL — AB (ref 30.0–36.0)
MCV: 85.2 fL (ref 78.0–100.0)
MPV: 9.2 fL (ref 8.6–12.4)
Platelets: 404 10*3/uL — ABNORMAL HIGH (ref 150–400)
RBC: 3.86 MIL/uL — ABNORMAL LOW (ref 3.87–5.11)
RDW: 19.7 % — AB (ref 11.5–15.5)
WBC: 10.7 10*3/uL — AB (ref 4.0–10.5)

## 2016-01-02 NOTE — Progress Notes (Signed)
Subjective:    Patient ID: [facility], Carrie [id number], 82 y.o.   MRN: [id number]  HPI Referred by Dr. Dwana Melena for rectal bleeding. Resident of [facility].  She tells me she has a hemorrhoid and now it has shrunk. Has tried Anusol 2.5% to her rectum x 2 weeks. The rectal bleeding lasted a few days. She has not seen any blood since. Caregiver in room says patient had rectal bleeding on and off for a week. Her appetite is good.  No weight loss.  She usually has a BM every 2-3 days a day. Stool are black and she says from the Iron pill she takes. She states she is not going to have a colonoscopy. She has never undergone a colonoscopy. No family hx of colon cancer. Hx of atrial fib and maintained on Pradaxa.  Diabetic for years.  CBC    Component Value Date/Time   WBC 6.6 11/18/2014 0650   RBC 3.61* 11/18/2014 0650   HGB 11.1* 11/18/2014 0650   HCT 34.4* 11/18/2014 0650   PLT 204 11/18/2014 0650   MCV 95.3 11/18/2014 0650   MCH 30.7 11/18/2014 0650   MCHC 32.3 11/18/2014 0650   RDW 13.7 11/18/2014 0650   LYMPHSABS 1.3 11/15/2014 1759   MONOABS 0.6 11/15/2014 1759   EOSABS 0.2 11/15/2014 1759   BASOSABS 0.0 11/15/2014 1759      Review of Systems Past Medical History  Diagnosis Date  . DM type 2 (diabetes mellitus, type 2) (HCC)   . Bronchitis   . COPD (chronic obstructive pulmonary disease) (HCC)   . Hyperlipidemia   . Neuropathy (HCC)   . Chronic a-fib (HCC)   . Diabetes mellitus with neuropathy (HCC)   . High cholesterol   . HTN (hypertension)     pt denies    Past Surgical History  Procedure Laterality Date  . Thyroidectomy, partial      remote  . Tonsillectomy    . Appendectomy    . Cholecystectomy    . Total abdominal hysterectomy    . Bladder surgery      No Known Allergies  Current Outpatient Prescriptions on File Prior to Visit  Medication Sig Dispense Refill  . acetaminophen (TYLENOL) 325 MG tablet Take 650 mg by mouth 3 (three)  times daily.    . benzonatate (TESSALON PERLES) 100 MG capsule Take 1 capsule (100 mg total) by mouth 3 (three) times daily as needed for cough. 30 capsule 1  . CALCIUM CITRATE PO Take 2 tablets by mouth daily.    . dabigatran (PRADAXA) 75 MG CAPS Take 1 capsule (75 mg total) by mouth every 12 (twelve) hours. 180 capsule 1  . diltiazem (CARDIZEM SR) 60 MG 12 hr capsule Take 1 capsule (60 mg total) by mouth every 12 (twelve) hours. 60 capsule 0  . docusate sodium (COLACE) 100 MG capsule Take 100 mg by mouth 2 (two) times daily.    . DULoxetine (CYMBALTA) 30 MG capsule Take 1 capsule by mouth daily.    . furosemide (LASIX) 40 MG tablet TAKE (1) TABLET BY MOUTH TWICE DAILY. 60 tablet 3  . gabapentin (NEURONTIN) 600 MG tablet Take 600 mg by mouth 4 (four) times daily.    Marland Kitchen guaiFENesin (MUCINEX) 600 MG 12 hr tablet Take 600 mg by mouth 2 (two) times daily.    . metFORMIN (GLUCOPHAGE) 500 MG tablet Take 500 mg by mouth 2 (two) times daily with a meal. Reported on 01/02/2016    .  Multiple Vitamins-Minerals (ABC PLUS SENIOR) TABS Take 1 tablet by mouth daily.    Marland Kitchen omeprazole (PRILOSEC) 20 MG capsule Take 1 capsule by mouth daily.    . polyethylene glycol (MIRALAX / GLYCOLAX) packet Take 17 g by mouth daily.    . pravastatin (PRAVACHOL) 20 MG tablet Take 20 mg by mouth daily.    . predniSONE (DELTASONE) 5 MG tablet Take 1 tablet (5 mg total) by mouth daily with breakfast. 30 tablet 4  . traMADol (ULTRAM) 50 MG tablet Take 50 mg by mouth every 12 (twelve) hours.     Marland Kitchen albuterol (PROAIR HFA) 108 (90 BASE) MCG/ACT inhaler Inhale 2 puffs into the lungs every 4 (four) hours as needed for wheezing or shortness of breath. 8.5 g 11  . Respiratory Therapy Supplies (FLUTTER) DEVI Blow through 4 times, and do this 3 times daily when needed 1 each 0   No current facility-administered medications on file prior to visit.        Objective:   Physical Exam Blood pressure 100/72, pulse 60, temperature 98.4 F (36.9  C), height  (1.651 m), weight 133 lb 8 oz (60.555 kg). Alert and oriented. Skin warm and dry. Oral mucosa is moist.   . Sclera anicteric, conjunctivae is pink. Thyroid not enlarged. No cervical lymphadenopathy. Lungs clear. Heart regular rate and rhythm.  Abdomen is soft. Bowel sounds are positive. No hepatomegaly. No abdominal masses felt. No tenderness.  No edema to lower extremities. Stool black and guaiac negative.   Lot [id number] Ex 9/17-     Assessment & Plan:  Rectal bleeding resolved. Stool negative for blood.  CBC today. If any further bleeding, may consider colonoscopy. I discussed with Dr Karilyn Cota.

## 2016-01-02 NOTE — Patient Instructions (Signed)
CBC today. If any further rectal bleeding, may consider colonoscopy.

## 2016-01-04 ENCOUNTER — Telehealth (INDEPENDENT_AMBULATORY_CARE_PROVIDER_SITE_OTHER): Payer: Self-pay | Admitting: *Deleted

## 2016-01-04 DIAGNOSIS — K625 Hemorrhage of anus and rectum: Secondary | ICD-10-CM

## 2016-01-04 NOTE — Telephone Encounter (Signed)
.  Per Terri Setzer,NP patient is to have lab work in 2 weeks. 

## 2016-01-08 ENCOUNTER — Encounter (INDEPENDENT_AMBULATORY_CARE_PROVIDER_SITE_OTHER): Payer: Self-pay | Admitting: *Deleted

## 2016-01-08 ENCOUNTER — Other Ambulatory Visit (INDEPENDENT_AMBULATORY_CARE_PROVIDER_SITE_OTHER): Payer: Self-pay | Admitting: *Deleted

## 2016-01-08 DIAGNOSIS — K625 Hemorrhage of anus and rectum: Secondary | ICD-10-CM

## 2016-01-19 LAB — CBC
HEMATOCRIT: 36 % (ref 36.0–46.0)
HEMOGLOBIN: 10.8 g/dL — AB (ref 12.0–15.0)
MCH: 26.8 pg (ref 26.0–34.0)
MCHC: 30 g/dL (ref 30.0–36.0)
MCV: 89.3 fL (ref 78.0–100.0)
MPV: 9.4 fL (ref 8.6–12.4)
PLATELETS: 336 10*3/uL (ref 150–400)
RBC: 4.03 MIL/uL (ref 3.87–5.11)
RDW: 22.1 % — ABNORMAL HIGH (ref 11.5–15.5)
WBC: 10.2 10*3/uL (ref 4.0–10.5)

## 2016-01-24 ENCOUNTER — Telehealth (INDEPENDENT_AMBULATORY_CARE_PROVIDER_SITE_OTHER): Payer: Self-pay | Admitting: *Deleted

## 2016-01-24 DIAGNOSIS — K625 Hemorrhage of anus and rectum: Secondary | ICD-10-CM

## 2016-01-24 NOTE — Telephone Encounter (Signed)
.  Per Terri Setzer,NP patient will have labs in 4 weeks. 

## 2016-01-30 ENCOUNTER — Other Ambulatory Visit (HOSPITAL_COMMUNITY)
Admission: RE | Admit: 2016-01-30 | Discharge: 2016-01-30 | Disposition: A | Discharge status: Home / Self Care | Payer: Medicare Other | Source: Other Acute Inpatient Hospital | Attending: Vascular Surgery | Admitting: Vascular Surgery

## 2016-01-30 DIAGNOSIS — N390 Urinary tract infection, site not specified: Secondary | ICD-10-CM | POA: Diagnosis present

## 2016-01-30 LAB — URINE MICROSCOPIC-ADD ON

## 2016-01-30 LAB — URINALYSIS, ROUTINE W REFLEX MICROSCOPIC
GLUCOSE, UA: NEGATIVE mg/dL
Hgb urine dipstick: NEGATIVE
KETONES UR: 40 mg/dL — AB
Nitrite: NEGATIVE
PROTEIN: NEGATIVE mg/dL
Specific Gravity, Urine: 1.015 (ref 1.005–1.030)
pH: 5 (ref 5.0–8.0)

## 2016-02-01 LAB — URINE CULTURE: Culture: >=100000

## 2016-02-05 ENCOUNTER — Ambulatory Visit (INDEPENDENT_AMBULATORY_CARE_PROVIDER_SITE_OTHER): Payer: Medicare Other | Admitting: Internal Medicine

## 2016-02-05 ENCOUNTER — Encounter: Payer: Self-pay | Admitting: Internal Medicine

## 2016-02-05 ENCOUNTER — Ambulatory Visit (INDEPENDENT_AMBULATORY_CARE_PROVIDER_SITE_OTHER)
Admission: RE | Admit: 2016-02-05 | Discharge: 2016-02-05 | Disposition: A | Discharge status: Home / Self Care | Payer: Medicare Other | Source: Ambulatory Visit | Attending: Internal Medicine | Admitting: Internal Medicine

## 2016-02-05 VITALS — BP 112/62 | HR 83 | Ht 65.0 in | Wt 140.0 lb

## 2016-02-05 DIAGNOSIS — J449 Chronic obstructive pulmonary disease, unspecified: Secondary | ICD-10-CM

## 2016-02-05 DIAGNOSIS — I4891 Unspecified atrial fibrillation: Secondary | ICD-10-CM | POA: Diagnosis not present

## 2016-02-05 DIAGNOSIS — R911 Solitary pulmonary nodule: Secondary | ICD-10-CM | POA: Diagnosis not present

## 2016-02-05 DIAGNOSIS — J42 Unspecified chronic bronchitis: Secondary | ICD-10-CM | POA: Diagnosis not present

## 2016-02-05 NOTE — Patient Instructions (Signed)
Recommend you continue daily prednisone 5 mg long-term  Otherwise your breathing is doing very well now.   Order- CXR  Dx chronic bronchitis  Please call as needed

## 2016-02-05 NOTE — Progress Notes (Signed)
Patient ID: Carrie Heath, female    DOB: December 16, 1920, 82 y.o.   MRN: [id number]  HPI 04/02/11- [age] yoF followed for COPD and lung nodule, complicated by DM Last here December 05, 2010- note reviewed.  Here with son today, she complains mostly about arthritis pains. She hasn't needed prednisone in past week. Her breathing hasn't missed it. Hasn't needed O2 in a month or two from [facility]. Not needing her nebulizer machine.   07/30/11- 89 yoF followed for COPD and lung nodule, complicated by DM    Daughter here Fairly stable. Children encourage her to continue her Rosalyn GessBrovana and she takes 1/2 of a 1 mg prednisone tab. Tried off this summer and remembers restarting it because of cough and wheeze. She is vague about whether she has prednisone 1 vs 5 mg pills.  Still notes some wheeze.  CXR 05/30/2010- stable mild COPD, NAD, no nodules seen  01/27/12- 89 yoF followed for COPD and lung nodule, complicated by DM   Son here FOLLOWS FOR: reports still having some SOB occasionally, chest congestion and prod cough with foamy clear/cream colored mucus - but reports has not used the oxygen at home.  not using brovana daily Doesn't hear herself wheeze. Uses her nebulizer with Brovana about once a day. Uses pro air off-and-on, 0 to twice daily. Room air cleaner. Turned her oxygen back in because she wasn't using it. Daily prednisone now at 3 mg. Mostly notices exertional dyspnea when up for ADLs like bathroom. Wants print prescriptions for her medications, planning 4-6 week trip to [place].  09/14/12- 89 yoF followed for COPD and lung nodule, complicated by DM, AFib                  Son here SOB and wheezing at times; cough mostly when congested and before/after Brovana tx. Has had flu vaccine. Has seen Dr. Daleen SquibbWall for management of atrial fibrillation. Continues prednisone 2.5 mg daily for COPD. Without that, she gets wheeze and chest congestion. Using neb brovana once or twice daily with rare need for  rescue inhaler. No longer has oxygen at home. COPD assessment test (CAT) score 21/40  03/14/13- 89 yoF followed for COPD and lung nodule, complicated by DM, AFib                  Son here FOLLOWS FOR: not able to get brovana neb rx; will need ALL rx's refilled today as she is going out of state.  Son is here Rosalyn GessBrovana is too expensive. We discussed her medications and especially her nebulizer meds. She is going to [place] again this summer. Knees limit her walking. Little cough or phlegm now. CXR 01/28/12 IMPRESSION:  Chronic bronchitic changes.  Original Report Authenticated By: Lollie MarrowMARK A. BOLES, M.D.  09/13/13- 89 yoF followed for COPD and lung nodule, complicated by DM, AFib                  DIL here FOLLOWS FOR: pt states she still has to use rescue inhaler sometimes(BID); has good and bad days. Cough at night that sounds wet.  Cough and wheeze w/ weather change. Walks at home w/ walker and cane. Daily prednisone 1/2 x 5  Mg Using neb ipratropium 2-3 x/ day. Not using Perforomist or Brovana- too expensive.  Son needs FMLA  03/14/14- 89 yoF followed for COPD and lung nodule, complicated by DM, AFib                 Son here  FOLLOWS FOR:  Pt now [facility] in [place].  Reports increased sob, cough with yellow mucus, rattling and wheezing 2-3 weeks.  Discuss pred and other meds today She is now living in [facility] assisted living in [place]. She manages her own inhalers and nebulizer. She had been off of maintenance prednisone and they recently restarted at 1 mg daily. G.radually increasing chest congestion and some yellow sputum. CXR 09/14/13 IMPRESSION:  Minimal atelectasis at the lung bases laterally. No visible  pulmonary nodules.  Electronically Signed  By: Geanie Cooley M.D.  On: 09/13/2013 14:39  04/25/14- 89 yoF followed for COPD and lung nodule, complicated by DM, AFib                 Son here FOLLOWS FOR: pt states her breathing is stable.  Denies any cough/ congestion, sob, wheezing.    Now in assisted living and doing very well. Off prednisone. Occasional rescue inhaler. Not needing her nebulizer.  11/01/14 Post Hospital follow up  Was recently admitted 12/5 for acute CHF , afib with RVR and AB exacerbation. tx for rate control with diltiazem. Had aggressive diuresis w/ improvement .  tx w/ iv abx and steroids. Along with oxygen. D/c on O2. , pred taper and Zpak.  She remains very weak. Cough is some better.  No fever , chest pain, increased leg swelling.  Caregiver present with pt along with telephone conference with pts son.   12/13/14-  89 yoF followed for COPD and lung nodule, complicated by DM, AFib                 Aide from nursing home here ACUTE VISIT: breathing is doing well; continues to sleep with O2. Pt is having increased swelling in legs and weeping as well.  Post Hosp 1/5-11/18/14   DM2, AFib, CHF, HBP  discharge weight 145 pounds She complains of weeping fluid from edematous legs especially since yesterday. Edema worse since leaving the hospital. Lasix had been increased from 40 mg daily to 40 mg twice daily. She has elastic hose, too tight.  01/31/15- 89 yoF followed for COPD and lung nodule, complicated by DM, AFib                son is here from [place] FOLLOWS FOR: pt is doing well being on Lasix BID for swelling in legs and feet at prior visit. Continues to wear O2 through [facility] at night. Outside lab K 3.9 12/28/14 She uses oxygen at night and only occasionally in the daytime. Son asked about getting her a portable oxygen concentrator. We talked about guidelines for portable oxygen.  08/07/15- 89 yoF former smoker followed for COPD and lung nodule, complicated by DM, AFib  O2 2L/ [facility]- prn            Son here                 She now lives at [facility] flu vax there FOLLOWS FOR:Pt states breathing has been well overall; son states patient had issues-when using concentrator patient had concerns but puts on and uses. Has taken off in middle of  night. Has O2, owns it, uses only prn now.  Had CHF in December Rx'd lasix. Not using Proair or benzonatate. CXR 2/226/16 1V- reviewed FINDINGS: Cardiac silhouette is upper limits of normal size, mediastinal silhouette is nonsuspicious. Mild bronchitic changes without focal consolidation or pleural effusion, improved aeration of the lung bases. No pneumothorax. Osteopenic. Mild degenerative change of the  thoracic spine with thoracolumbar scoliosis. IMPRESSION: Borderline cardiomegaly. Mild bronchitic changes without focal consolidation. Electronically Signed  By: Awilda Metroourtnay Bloomer  On: 01/06/2015 21:41  02/05/2016-82 year old female former smoker followed for COPD, lung nodule, complicated by DM, A. Fib, CHF O2 2 L/[facility]-uses when necessary       son brings her. He has to drive from his home in [place], to [place] to pick her up and get her ready, then bring her down here. It means a day out of work for him. He asks help with FMLA. Follows for: COPD. Pt states that her breathing is doing well. Pt denies cough/wheeze/SOB/CP/tightness.  Breathing has been good. She owns her oxygen but uses it only occasionally now. Has not needed to use rescue inhaler at the Care home where she lives. She is very unstable on her legs now with little ambulation. Being treated for recurrent cellulitis of left leg, on antibiotic from the physician at her care center.  Review of Systems-seeHPI Constitutional:   No weight loss, night sweats,  Fevers, chills, +fatigue, lassitude. HEENT:   No headaches,  Difficulty swallowing,  Tooth/dental problems,  Sore throat,                No sneezing, itching, ear ache, nasal congestion, post nasal drip,  CV:  No chest pain, orthopnea, PND, swelling in lower extremities, anasarca, dizziness, +palpitations GI  No heartburn, indigestion, abdominal pain, nausea, vomiting,  Resp: + shortness of breath with exertion or at rest.  No excess mucus, no- productive cough,   non-productive cough,  No- coughing up of blood.  No-change in color of mucus.  Occasional   wheezing.  Skin: no rash or lesions. GU: no dysuria, . MS: No acute pain. +Leg weakness, + cellulitis left leg being treated Psych:  No change in mood or affect. No depression or anxiety.  No memory loss.  Objective:  General- Alert, Oriented, Affect-appropriate, Distress- none acute. +Wheelchair, +98% sat rest/ room air. Skin- rash-none, lesions- none, excoriation- none Lymphadenopathy- none Head- atraumatic            Eyes- Gross vision intact, PERRLA, conjunctivae clear secretions            Ears- Hearing, canals normal for age            Nose- Clear, no-Septal dev, mucus, polyps, erosion, perforation             Throat- Mallampati II , mucosa clear , drainage- none, tonsils- atrophic Neck- flexible , trachea midline, no stridor , thyroid nl, carotid no bruit Chest - symmetrical excursion , unlabored           Heart/CV-IRR , no murmur , no gallop  , no rub, nl s1 s2                         - JVD+slight , edema+1, stasis changes- none, varices- none,                                          Lung- +no rales, cough- none , dullness-none, rub- none           Chest wall-  Abd-  Br/ Gen/ Rectal- Not done, not indicated Extrem- cyanosis- none, clubbing, none, atrophy- none, strength- nl, + bright red left foot to knee Neuro- grossly intact to observation

## 2016-02-06 NOTE — Assessment & Plan Note (Signed)
Well-controlled without exacerbation. Son emphasizes that she remains dependent on low-dose prednisone maintenance at 5 mg daily. When this is been stopped, she has had respiratory exacerbations within a week or so. Plan-chest x-ray, steroid talk

## 2016-02-06 NOTE — Assessment & Plan Note (Signed)
Plan-chest x-ray. If nothing seen, this problem can be dropped

## 2016-02-06 NOTE — Assessment & Plan Note (Signed)
Pulse feels like atrial fibrillation but with good ventricular response rate

## 2016-02-08 ENCOUNTER — Telehealth: Payer: Self-pay | Admitting: Internal Medicine

## 2016-02-08 NOTE — Telephone Encounter (Signed)
Pt son cb, [phone]

## 2016-02-08 NOTE — Telephone Encounter (Signed)
Notes Recorded by Lorel MonacoLindsay C Lemons, CMA on 02/08/2016 at 11:02 AM lmtcb x1 for pt's son, Jillyn HiddenGary. Notes Recorded by Waymon Budgelinton D Young, MD on 02/05/2016 at 1:27 PM CXR- stable with no new or active process seen. ---  LMOMTCB x1

## 2016-02-08 NOTE — Telephone Encounter (Signed)
Pt son Jillyn HiddenGary is aware of results. He had no questions.

## 2016-02-22 ENCOUNTER — Encounter: Payer: Medicare Other | Admitting: Adult Health

## 2016-02-22 NOTE — Progress Notes (Signed)
Cardiology Office Note   Date:  02/22/2016   ERROR

## 2016-03-14 ENCOUNTER — Ambulatory Visit: Payer: Medicare Other | Admitting: Orthopedic Surgery

## 2016-03-15 ENCOUNTER — Other Ambulatory Visit: Payer: Self-pay | Admitting: *Deleted

## 2016-03-15 DIAGNOSIS — M25511 Pain in right shoulder: Secondary | ICD-10-CM

## 2016-03-19 ENCOUNTER — Ambulatory Visit: Payer: Medicare Other | Admitting: Orthopedic Surgery

## 2016-03-19 ENCOUNTER — Other Ambulatory Visit (HOSPITAL_COMMUNITY)
Admission: RE | Admit: 2016-03-19 | Discharge: 2016-03-19 | Disposition: A | Discharge status: Home / Self Care | Payer: Medicare Other | Source: Other Acute Inpatient Hospital | Attending: Vascular Surgery | Admitting: Vascular Surgery

## 2016-03-19 DIAGNOSIS — N390 Urinary tract infection, site not specified: Secondary | ICD-10-CM | POA: Insufficient documentation

## 2016-03-19 LAB — URINALYSIS, ROUTINE W REFLEX MICROSCOPIC
Bilirubin Urine: NEGATIVE
GLUCOSE, UA: NEGATIVE mg/dL
Hgb urine dipstick: NEGATIVE
KETONES UR: NEGATIVE mg/dL
NITRITE: NEGATIVE
PROTEIN: NEGATIVE mg/dL
Specific Gravity, Urine: <1.005 — ABNORMAL LOW (ref 1.005–1.030)
pH: 6 (ref 5.0–8.0)

## 2016-03-19 LAB — URINE MICROSCOPIC-ADD ON: RBC / HPF: NONE SEEN RBC/hpf (ref 0–5)

## 2016-03-22 ENCOUNTER — Telehealth: Payer: Self-pay | Admitting: Internal Medicine

## 2016-03-22 LAB — URINE CULTURE

## 2016-03-22 NOTE — Telephone Encounter (Signed)
LMTCB

## 2016-03-22 NOTE — Telephone Encounter (Signed)
Jillyn HiddenGary called back; he would like to have the originals mailed to him at [address] [place], [place] [id number]. This has been mailed and nothing more needed at this time.

## 2016-03-28 ENCOUNTER — Other Ambulatory Visit (HOSPITAL_COMMUNITY)
Admission: AD | Admit: 2016-03-28 | Discharge: 2016-03-28 | Disposition: A | Discharge status: Home / Self Care | Payer: Medicare Other | Source: Skilled Nursing Facility | Attending: Family Medicine | Admitting: Family Medicine

## 2016-03-28 DIAGNOSIS — I130 Hypertensive heart and chronic kidney disease with heart failure and stage 1 through stage 4 chronic kidney disease, or unspecified chronic kidney disease: Secondary | ICD-10-CM | POA: Insufficient documentation

## 2016-03-28 LAB — COMPREHENSIVE METABOLIC PANEL
ALBUMIN: 2.8 g/dL — AB (ref 3.5–5.0)
ALK PHOS: 91 U/L (ref 38–126)
ALT: 22 U/L (ref 14–54)
AST: 25 U/L (ref 15–41)
Anion gap: 15 (ref 5–15)
BILIRUBIN TOTAL: 0.6 mg/dL (ref 0.3–1.2)
BUN: 30 mg/dL — AB (ref 6–20)
CALCIUM: 9 mg/dL (ref 8.9–10.3)
CO2: 24 mmol/L (ref 22–32)
CREATININE: 1.14 mg/dL — AB (ref 0.44–1.00)
Chloride: 94 mmol/L — ABNORMAL LOW (ref 101–111)
GFR calc Af Amer: 47 mL/min — ABNORMAL LOW (ref >60)
GFR calc non Af Amer: 40 mL/min — ABNORMAL LOW (ref >60)
GLUCOSE: 237 mg/dL — AB (ref 65–99)
POTASSIUM: 3.6 mmol/L (ref 3.5–5.1)
Sodium: 133 mmol/L — ABNORMAL LOW (ref 135–145)
TOTAL PROTEIN: 6.8 g/dL (ref 6.5–8.1)

## 2016-03-28 LAB — CBC
HEMATOCRIT: 43.4 % (ref 36.0–46.0)
HEMOGLOBIN: 13.9 g/dL (ref 12.0–15.0)
MCH: 29.8 pg (ref 26.0–34.0)
MCHC: 32 g/dL (ref 30.0–36.0)
MCV: 93.1 fL (ref 78.0–100.0)
Platelets: 339 10*3/uL (ref 150–400)
RBC: 4.66 MIL/uL (ref 3.87–5.11)
RDW: 17.7 % — ABNORMAL HIGH (ref 11.5–15.5)
WBC: 15.7 10*3/uL — AB (ref 4.0–10.5)

## 2016-04-25 ENCOUNTER — Ambulatory Visit: Payer: Medicare Other

## 2016-06-21 ENCOUNTER — Other Ambulatory Visit (HOSPITAL_COMMUNITY)
Admission: RE | Admit: 2016-06-21 | Discharge: 2016-06-21 | Disposition: A | Discharge status: Home / Self Care | Source: Other Acute Inpatient Hospital | Attending: Registered Nurse | Admitting: Registered Nurse

## 2016-06-21 DIAGNOSIS — N390 Urinary tract infection, site not specified: Secondary | ICD-10-CM | POA: Diagnosis present

## 2016-06-21 LAB — URINALYSIS, ROUTINE W REFLEX MICROSCOPIC
BILIRUBIN URINE: NEGATIVE
Glucose, UA: NEGATIVE mg/dL
HGB URINE DIPSTICK: NEGATIVE
KETONES UR: NEGATIVE mg/dL
Leukocytes, UA: NEGATIVE
NITRITE: NEGATIVE
Protein, ur: NEGATIVE mg/dL
Specific Gravity, Urine: 1.01 (ref 1.005–1.030)
pH: 6 (ref 5.0–8.0)

## 2016-06-24 LAB — URINE CULTURE: Culture: >=100000 — AB

## 2016-11-22 ENCOUNTER — Other Ambulatory Visit (HOSPITAL_COMMUNITY)
Admission: AD | Admit: 2016-11-22 | Discharge: 2016-11-22 | Disposition: A | Discharge status: Home / Self Care | Source: Skilled Nursing Facility | Attending: Registered Nurse | Admitting: Registered Nurse

## 2016-11-22 DIAGNOSIS — N390 Urinary tract infection, site not specified: Secondary | ICD-10-CM | POA: Insufficient documentation

## 2016-11-22 LAB — URINALYSIS, ROUTINE W REFLEX MICROSCOPIC
BILIRUBIN URINE: NEGATIVE
Glucose, UA: NEGATIVE mg/dL
HGB URINE DIPSTICK: NEGATIVE
KETONES UR: NEGATIVE mg/dL
NITRITE: NEGATIVE
PROTEIN: NEGATIVE mg/dL
SPECIFIC GRAVITY, URINE: 1.014 (ref 1.005–1.030)
pH: 6 (ref 5.0–8.0)

## 2016-11-25 LAB — URINE CULTURE

## 2018-01-05 ENCOUNTER — Other Ambulatory Visit (HOSPITAL_COMMUNITY)
Admission: RE | Admit: 2018-01-05 | Discharge: 2018-01-05 | Disposition: A | Discharge status: Home / Self Care | Payer: Medicare Other | Source: Other Acute Inpatient Hospital | Attending: Family Medicine | Admitting: Family Medicine

## 2018-01-05 DIAGNOSIS — Z029 Encounter for administrative examinations, unspecified: Secondary | ICD-10-CM | POA: Insufficient documentation

## 2018-01-05 LAB — URINALYSIS, MICROSCOPIC (REFLEX): RBC / HPF: NONE SEEN RBC/hpf (ref 0–5)

## 2018-01-05 LAB — URINALYSIS, ROUTINE W REFLEX MICROSCOPIC
BILIRUBIN URINE: NEGATIVE
GLUCOSE, UA: NEGATIVE mg/dL
Hgb urine dipstick: NEGATIVE
Nitrite: NEGATIVE
PH: 5 (ref 5.0–8.0)
Specific Gravity, Urine: 1.025 (ref 1.005–1.030)

## 2018-01-07 ENCOUNTER — Other Ambulatory Visit: Payer: Self-pay

## 2018-01-07 ENCOUNTER — Emergency Department (HOSPITAL_COMMUNITY)
Admission: EM | Admit: 2018-01-07 | Discharge: 2018-01-07 | Disposition: A | Discharge status: Home / Self Care | Payer: Medicare Other | Attending: Emergency Medicine | Admitting: Emergency Medicine

## 2018-01-07 ENCOUNTER — Encounter (HOSPITAL_COMMUNITY): Payer: Self-pay | Admitting: Emergency Medicine

## 2018-01-07 ENCOUNTER — Emergency Department (HOSPITAL_COMMUNITY): Payer: Medicare Other

## 2018-01-07 DIAGNOSIS — R0602 Shortness of breath: Secondary | ICD-10-CM | POA: Diagnosis present

## 2018-01-07 DIAGNOSIS — Z79899 Other long term (current) drug therapy: Secondary | ICD-10-CM | POA: Insufficient documentation

## 2018-01-07 DIAGNOSIS — Z7901 Long term (current) use of anticoagulants: Secondary | ICD-10-CM | POA: Insufficient documentation

## 2018-01-07 DIAGNOSIS — I5021 Acute systolic (congestive) heart failure: Secondary | ICD-10-CM

## 2018-01-07 DIAGNOSIS — Z87891 Personal history of nicotine dependence: Secondary | ICD-10-CM | POA: Insufficient documentation

## 2018-01-07 DIAGNOSIS — J449 Chronic obstructive pulmonary disease, unspecified: Secondary | ICD-10-CM | POA: Insufficient documentation

## 2018-01-07 DIAGNOSIS — E119 Type 2 diabetes mellitus without complications: Secondary | ICD-10-CM | POA: Diagnosis not present

## 2018-01-07 MED ORDER — FUROSEMIDE 40 MG PO TABS
40.0000 mg | ORAL_TABLET | Freq: Once | ORAL | Status: AC
  Administered 2018-01-07: 40 mg via ORAL
  Filled 2018-01-07: qty 1

## 2018-01-07 NOTE — ED Triage Notes (Signed)
Pt c/o of left side abdominal pain and bilteral leg pain. Legs are hot to touch, red, and painful to touch.
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
## 2018-01-07 NOTE — Discharge Instructions (Signed)
Increase your Lasix from 20 mg a day to 40 mg a day.  Follow-up with your doctor next week for recheck

## 2018-01-08 LAB — URINE CULTURE: Culture: >=100000 — AB

## 2018-03-19 ENCOUNTER — Non-Acute Institutional Stay: Payer: Self-pay | Admitting: Hospice and Palliative Medicine

## 2018-03-19 DIAGNOSIS — Z515 Encounter for palliative care: Secondary | ICD-10-CM

## 2018-03-20 ENCOUNTER — Encounter (HOSPITAL_COMMUNITY): Payer: Self-pay | Admitting: *Deleted

## 2018-03-20 ENCOUNTER — Emergency Department (HOSPITAL_COMMUNITY)
Admission: EM | Admit: 2018-03-20 | Discharge: 2018-03-20 | Disposition: A | Discharge status: Home / Self Care | Payer: Medicare Other | Attending: Emergency Medicine | Admitting: Emergency Medicine

## 2018-03-20 ENCOUNTER — Emergency Department (HOSPITAL_COMMUNITY): Payer: Medicare Other

## 2018-03-20 ENCOUNTER — Other Ambulatory Visit: Payer: Self-pay

## 2018-03-20 DIAGNOSIS — Z87891 Personal history of nicotine dependence: Secondary | ICD-10-CM | POA: Diagnosis not present

## 2018-03-20 DIAGNOSIS — Z79899 Other long term (current) drug therapy: Secondary | ICD-10-CM | POA: Insufficient documentation

## 2018-03-20 DIAGNOSIS — M79602 Pain in left arm: Secondary | ICD-10-CM

## 2018-03-20 DIAGNOSIS — N390 Urinary tract infection, site not specified: Secondary | ICD-10-CM | POA: Diagnosis not present

## 2018-03-20 DIAGNOSIS — E1140 Type 2 diabetes mellitus with diabetic neuropathy, unspecified: Secondary | ICD-10-CM | POA: Diagnosis not present

## 2018-03-20 DIAGNOSIS — I110 Hypertensive heart disease with heart failure: Secondary | ICD-10-CM | POA: Diagnosis not present

## 2018-03-20 DIAGNOSIS — M25512 Pain in left shoulder: Secondary | ICD-10-CM | POA: Diagnosis present

## 2018-03-20 DIAGNOSIS — J449 Chronic obstructive pulmonary disease, unspecified: Secondary | ICD-10-CM | POA: Insufficient documentation

## 2018-03-20 DIAGNOSIS — R4182 Altered mental status, unspecified: Secondary | ICD-10-CM | POA: Diagnosis not present

## 2018-03-20 DIAGNOSIS — I5033 Acute on chronic diastolic (congestive) heart failure: Secondary | ICD-10-CM | POA: Insufficient documentation

## 2018-03-20 HISTORY — DX: Chronic atrial fibrillation, unspecified: I48.20

## 2018-03-20 HISTORY — DX: Unspecified diastolic (congestive) heart failure: I50.30

## 2018-03-20 HISTORY — DX: Type 2 diabetes mellitus without complications: E11.9

## 2018-03-20 LAB — URINALYSIS, ROUTINE W REFLEX MICROSCOPIC
Bilirubin Urine: NEGATIVE
Glucose, UA: NEGATIVE mg/dL
Ketones, ur: NEGATIVE mg/dL
Nitrite: NEGATIVE
PH: 7 (ref 5.0–8.0)
Protein, ur: NEGATIVE mg/dL
SPECIFIC GRAVITY, URINE: 1.008 (ref 1.005–1.030)

## 2018-03-20 MED ORDER — CEPHALEXIN 250 MG PO CAPS
250.0000 mg | ORAL_CAPSULE | Freq: Once | ORAL | Status: DC
Start: 2018-03-20 — End: 2018-03-20
  Filled 2018-03-20: qty 1

## 2018-03-20 MED ORDER — CEPHALEXIN 250 MG/5ML PO SUSR
ORAL | Status: AC
  Administered 2018-03-20: 250 mg
  Filled 2018-03-20: qty 10

## 2018-03-20 MED ORDER — CEPHALEXIN 500 MG PO CAPS: 500.0000 mg | ORAL_CAPSULE | Freq: Four times a day (QID) | ORAL | 0 refills | Status: DC

## 2018-03-20 NOTE — ED Notes (Signed)
Transportation notified 

## 2018-03-20 NOTE — ED Notes (Signed)
Dr Zammit in to see 

## 2018-03-20 NOTE — ED Notes (Signed)
Pt transported to CT ?

## 2018-03-20 NOTE — Progress Notes (Signed)
PALLIATIVE CARE CONSULT VISIT   PATIENT NAME: Carrie Heath DOB: 1921-03-17 MRN: [id number]  PRIMARY CARE PROVIDER: Benita Stabile (Inactive)  REFERRING PROVIDER: Dr. Nita Sells  RESPONSIBLE PARTY:   Both sons   RECOMMENDATIONS and PLAN:  1. Acute weakness in the setting of worsening memory, shoulder and leg weakness, CHF and COPD. She continues to reside at [facility]. She has had a significant change. She is no longer able to transfer. She cannot keep her head off her left shoulder. She could not feed herself per her son today without assistance due to her extreme tremors. She is getting lost in mid sentence and is falling asleep during my assessment. Suspect either UTI, PNA...even though I hear no congestion..just very diminished sounds or perhaps TIA?CVA? She was seen yesterday by her primary NP. A urinalysis is pending. Discussed goals of care with son and they want UTI definitely treated and a CT of her head explored to "know what is going on.." they desire hospice services if eligible but would like to determine the cause of the acute decline first. Will discuss with patient's primary NP and FU again early next week to see if UTI and treatment is helping her. 2. ACP: former hospice patient..DNR status. Would like work up to determine cause of new acute condition and then Hospice services if able.   I spent 40 minutes providing this consultation,  from 1:30 pm to 2:10pm. More than 50% of the time in this consultation was spent coordinating communication.   HISTORY OF PRESENT ILLNESS:  Carrie Heath is a 84 y.o.  female with multiple medical problems including worsening dementia, Afib, COPD and CHF, non healing leg wounds who has had an acute decline in cognition and function. Palliative Care was asked to assess for hospice appropriateness.   CODE STATUS: DNR  PPS: 30% HOSPICE ELIGIBILITY/DIAGNOSIS: monitoring/possible? Depends on cause of weakness? UTI? TIA?  PAST  MEDICAL HISTORY:  Past Medical History:  Diagnosis Date  . Bronchitis   . Chronic a-fib (HCC)   . Chronic atrial fibrillation (HCC)   . COPD (chronic obstructive pulmonary disease) (HCC)   . Diabetes mellitus without complication (HCC)   . Diastolic congestive heart failure (HCC)   . High cholesterol   . HTN (hypertension)    pt denies  . Hyperlipidemia   . Neuropathy     SOCIAL HX:  Social History   Tobacco Use  . Smoking status: Former Smoker    Packs/day: 0.50    Types: Cigarettes    Start date: 07/11/1944    Last attempt to quit: 11/11/2006    Years since quitting: 11.3  . Smokeless tobacco: Former Engineer, water Use Topics  . Alcohol use: No    Alcohol/week: 0.0 oz    ALLERGIES: No Known Allergies   PERTINENT MEDICATIONS:  Outpatient Encounter Medications as of 03/19/2018  Medication Sig  . acetaminophen (TYLENOL) 325 MG tablet Take 650 mg by mouth 3 (three) times daily.  Marland Kitchen docusate sodium (COLACE) 100 MG capsule Take 100 mg by mouth every other day.   . DULoxetine (CYMBALTA) 30 MG capsule Take 2 capsules by mouth at bedtime.   . furosemide (LASIX) 40 MG tablet TAKE (1) TABLET BY MOUTH TWICE DAILY. (Patient taking differently: TAKE 0.5 TABLET BY MOUTH ONCE DAILY.)  . hydrocortisone (ANUSOL-HC) 2.5 % rectal cream Place 1 application rectally 2 (two) times daily.  Marland Kitchen omeprazole (PRILOSEC) 20 MG capsule Take 1 capsule by mouth daily.  . polyethylene  glycol (MIRALAX / GLYCOLAX) packet Take 17 g by mouth daily.  . predniSONE (DELTASONE) 5 MG tablet Take 1 tablet (5 mg total) by mouth daily with breakfast.  . spironolactone (ALDACTONE) 25 MG tablet Take 25 mg by mouth daily.  . traMADol (ULTRAM) 50 MG tablet Take 50 mg by mouth every 12 (twelve) hours. Reported on 02/05/2016  . [DISCONTINUED] aspirin 81 MG tablet Take 81 mg by mouth daily.  . [DISCONTINUED] benzonatate (TESSALON PERLES) 100 MG capsule Take 1 capsule (100 mg total) by mouth 3 (three) times daily as needed for  cough. (Patient not taking: Reported on 03/20/2018)  . [DISCONTINUED] Calcium Carb-Cholecalciferol (OYSTER SHELL CALCIUM + D) 500-200 MG-UNIT TABS Take by mouth.   . [DISCONTINUED] CALCIUM CITRATE PO Take 2 tablets by mouth daily. Reported on 02/05/2016  . [DISCONTINUED] cephALEXin (KEFLEX) 250 MG capsule Take 1 capsule by mouth 3 (three) times daily.  . [DISCONTINUED] dabigatran (PRADAXA) 75 MG CAPS capsule Take 75 mg by mouth 2 (two) times daily. Reported on 02/05/2016  . [DISCONTINUED] dabigatran (PRADAXA) 75 MG CAPS Take 1 capsule (75 mg total) by mouth every 12 (twelve) hours. (Patient not taking: Reported on 02/05/2016)  . [DISCONTINUED] diltiazem (CARDIZEM SR) 60 MG 12 hr capsule Take 1 capsule (60 mg total) by mouth every 12 (twelve) hours. (Patient not taking: Reported on 03/20/2018)  . [DISCONTINUED] ferrous sulfate 325 (65 FE) MG tablet Take 325 mg by mouth daily with breakfast.  . [DISCONTINUED] furosemide (LASIX) 20 MG tablet Take 20 mg by mouth.  . [DISCONTINUED] gabapentin (NEURONTIN) 300 MG capsule Take 1 capsule by mouth 3 (three) times daily.  . [DISCONTINUED] gabapentin (NEURONTIN) 600 MG tablet Take 600 mg by mouth 4 (four) times daily.  . [DISCONTINUED] guaiFENesin (MUCINEX) 600 MG 12 hr tablet Take 600 mg by mouth 2 (two) times daily.  . [DISCONTINUED] metFORMIN (GLUCOPHAGE) 500 MG tablet Take 500 mg by mouth 2 (two) times daily with a meal. Reported on 01/02/2016  . [DISCONTINUED] Multiple Vitamins-Minerals (ABC PLUS SENIOR) TABS Take 1 tablet by mouth daily.  . [DISCONTINUED] potassium chloride (K-DUR) 10 MEQ tablet Take 10 mEq by mouth daily.  . [DISCONTINUED] pravastatin (PRAVACHOL) 20 MG tablet Take 20 mg by mouth daily.  . [DISCONTINUED] Respiratory Therapy Supplies (FLUTTER) DEVI Blow through 4 times, and do this 3 times daily when needed (Patient not taking: Reported on 03/20/2018)   No facility-administered encounter medications on file as of 03/19/2018.     PHYSICAL EXAM:     General: elderly, frail and extremely weak Cardiovascular: irreg rhythm; reg rate Pulmonary: diminished in bases; oxygen sats at 93%; no cough or wheeze Abdomen: soft, active BS, NTTP Extremities: lower legs wrapped; left shoulder pain/weaknes and now right shoulder weakness; too weak to stand;  Skin: thin, multiple skin bruising/tears; see wound care notes; lower extremities in una boots Neurological: alert, follows commands but goes back to sleep. Word searching. +increased weakness: head tilted to the left shoulder, unable to hold her head upright; + tremors  Truett Perna, NP

## 2018-03-20 NOTE — ED Notes (Signed)
Family at bedside explained that transport would arrive after 7pm

## 2018-03-20 NOTE — ED Provider Notes (Signed)
[facility] EMERGENCY DEPARTMENT Provider Note   CSN: [id number] Arrival date & time: 03/20/18  1021     History   Chief Complaint Chief Complaint  Patient presents with  . Arm Pain    HPI Carrie Heath is a 84 y.o. female.  Patient has been having pain in her left shoulder.  When she is lifted and moved she has a lot of pain.  The history is provided by the patient. No language interpreter was used.  Arm Pain  This is a new problem. The current episode started more than 2 days ago. The problem occurs constantly. The problem has not changed since onset.Pertinent negatives include no chest pain, no abdominal pain and no headaches. The symptoms are aggravated by twisting. Nothing relieves the symptoms. She has tried nothing for the symptoms. The treatment provided no relief.    Past Medical History:  Diagnosis Date  . Bronchitis   . Chronic a-fib (HCC)   . Chronic atrial fibrillation (HCC)   . COPD (chronic obstructive pulmonary disease) (HCC)   . Diabetes mellitus without complication (HCC)   . Diastolic congestive heart failure (HCC)   . High cholesterol   . HTN (hypertension)    pt denies  . Hyperlipidemia   . Neuropathy     Patient Active Problem List   Diagnosis Date Noted  . Edema, peripheral 12/13/2014  . Accelerated hypertension 11/15/2014  . Acute on chronic diastolic congestive heart failure (HCC) 11/15/2014  . Atrial fibrillation with RVR (HCC) 10/15/2014  . Acute on chronic diastolic CHF (congestive heart failure) (HCC) 10/15/2014  . Acute bronchitis with chronic obstructive pulmonary disease (COPD) (HCC) 10/15/2014  . SOB (shortness of breath) 10/15/2014  . Atrial fibrillation with controlled ventricular response (HCC) 02/17/2012  . Hip pain, bilateral 04/25/2011  . Spinal stenosis 04/25/2011  . Weakness of both legs 04/25/2011  . COPD exacerbation (HCC) 08/18/2009  . KNEE, ARTHRITIS, DEGEN./OSTEO 05/01/2009  . KNEE PAIN 05/01/2009  . Lung nodule  02/15/2009  . DM type 2 with diabetic peripheral neuropathy (HCC) 01/13/2009  . HYPERLIPIDEMIA 01/13/2009  . COPD mixed type (HCC) 01/13/2009    Past Surgical History:  Procedure Laterality Date  . APPENDECTOMY    . BLADDER SURGERY    . CHOLECYSTECTOMY    . THYROIDECTOMY, PARTIAL     remote  . TONSILLECTOMY    . TOTAL ABDOMINAL HYSTERECTOMY       OB History   None      Home Medications    Prior to Admission medications   Medication Sig Start Date End Date Taking? Authorizing Provider  acetaminophen (TYLENOL) 325 MG tablet Take 650 mg by mouth 3 (three) times daily.   Yes [provider]  bisacodyl (FLEET) 10 MG/30ML ENEM Place 10 mg rectally daily as needed (constipation).   Yes [provider]  carboxymethylcellulose (REFRESH PLUS) 0.5 % SOLN Apply 1 drop to eye 3 (three) times daily as needed.   Yes [provider]  diltiazem (CARDIZEM) 30 MG tablet Take 30 mg by mouth 4 (four) times daily.   Yes [provider]  docusate sodium (COLACE) 100 MG capsule Take 100 mg by mouth every other day.    Yes [provider]  DULoxetine (CYMBALTA) 30 MG capsule Take 2 capsules by mouth at bedtime.  07/26/15  Yes [provider]  feeding supplement, GLUCERNA SHAKE, (GLUCERNA SHAKE) LIQD Take 237 mLs by mouth every morning.   Yes [provider]  furosemide (LASIX) 40 MG tablet  TAKE (1) TABLET BY MOUTH TWICE DAILY. Patient taking differently: TAKE 0.5 TABLET BY MOUTH ONCE DAILY. 02/01/15  Yes Laqueta Linden, MD  hydrocortisone (ANUSOL-HC) 2.5 % rectal cream Place 1 application rectally 2 (two) times daily.   Yes [provider]  Menthol-Methyl Salicylate (BENGAY GREASELESS EX) Apply 1 application topically 2 (two) times daily. Apply to knees topically two times a day for pain.   Yes [provider]  nystatin ointment (MYCOSTATIN) Apply 1 application topically. Apply affected areas topically as needed for  fungal rash to abdominal folds, breasts and groin 2 times a day.   Yes [provider]  omeprazole (PRILOSEC) 20 MG capsule Take 1 capsule by mouth daily. 09/10/13  Yes [provider]  polyethylene glycol (MIRALAX / GLYCOLAX) packet Take 17 g by mouth daily.   Yes [provider]  predniSONE (DELTASONE) 5 MG tablet Take 1 tablet (5 mg total) by mouth daily with breakfast. 01/11/15  Yes Young, Clinton D, MD  pregabalin (LYRICA) 25 MG capsule Take 25 mg by mouth 2 (two) times daily.   Yes [provider]  spironolactone (ALDACTONE) 25 MG tablet Take 25 mg by mouth daily.   Yes [provider]  traMADol (ULTRAM) 50 MG tablet Take 50 mg by mouth every 12 (twelve) hours. Reported on 02/05/2016   Yes [provider]  Zinc Oxide (DESITIN) 40 % PSTE Apply topically. Apply to gluteals three times a day for redness.   Yes [provider]  albuterol (PROAIR HFA) 108 (90 BASE) MCG/ACT inhaler Inhale 2 puffs into the lungs every 4 (four) hours as needed for wheezing or shortness of breath. 10/15/13 08/12/15  Waymon Budge, MD    Family History Family History  Problem Relation Age of Onset  . Heart disease Father   . Alzheimer's disease Mother   . Heart disease Brother        CABG    Social History Social History   Tobacco Use  . Smoking status: Former Smoker    Packs/day: 0.50    Types: Cigarettes    Start date: 07/11/1944    Last attempt to quit: 11/11/2006    Years since quitting: 11.3  . Smokeless tobacco: Former Engineer, water Use Topics  . Alcohol use: No    Alcohol/week: 0.0 oz  . Drug use: No     Allergies   Patient has no known allergies.   Review of Systems Review of Systems  Constitutional: Negative for appetite change and fatigue.  HENT: Negative for congestion, ear discharge and sinus pressure.   Eyes: Negative for discharge.  Respiratory: Negative for cough.   Cardiovascular: Negative for chest pain.    Gastrointestinal: Negative for abdominal pain and diarrhea.  Genitourinary: Negative for frequency and hematuria.  Musculoskeletal: Negative for back pain.       Left shoulder pain  Skin: Negative for rash.  Neurological: Negative for seizures and headaches.  Psychiatric/Behavioral: Negative for hallucinations.     Physical Exam Updated Vital Signs BP 114/67   Pulse 80   Temp 98.1 F (36.7 C) (Oral)   Resp 20   Wt 59 kg (130 lb)   SpO2 94%   BMI 22.31 kg/m   Physical Exam  Constitutional: She is oriented to person, place, and time. She appears well-developed.  HENT:  Head: Normocephalic.  Eyes: Conjunctivae and EOM are normal. No scleral icterus.  Neck: Neck supple. No thyromegaly present.  Cardiovascular: Normal rate and regular rhythm. Exam reveals no  gallop and no friction rub.  No murmur heard. Pulmonary/Chest: No stridor. She has no wheezes. She has no rales. She exhibits no tenderness.  Abdominal: She exhibits no distension. There is no tenderness. There is no rebound.  Musculoskeletal: Normal range of motion. She exhibits no edema.  Tenderness to left shoulder worse with movement  Lymphadenopathy:    She has no cervical adenopathy.  Neurological: She is oriented to person, place, and time. She exhibits normal muscle tone. Coordination normal.  Skin: No rash noted. No erythema.  Psychiatric: She has a normal mood and affect. Her behavior is normal.     ED Treatments / Results  Labs (all labs ordered are listed, but only abnormal results are displayed) Labs Reviewed - No data to display  EKG None  Radiology Dg Shoulder Left  Result Date: 03/20/2018 CLINICAL DATA:  Left shoulder pain, no known injury, initial encounter EXAM: LEFT SHOULDER - 2+ VIEW COMPARISON:  None. FINDINGS: Degenerative changes of the acromioclavicular joint are noted. No acute fracture or dislocation is noted. No gross soft tissue abnormality is seen. The underlying bony thorax is within  normal limits. IMPRESSION: Mild degenerative change without acute abnormality. Electronically Signed   By: Alcide Clever M.D.   On: 03/20/2018 13:31    Procedures Procedures (including critical care time)  Medications Ordered in ED Medications - No data to display   Initial Impression / Assessment and Plan / ED Course  I have reviewed the triage vital signs and the nursing notes.  Pertinent labs & imaging results that were available during my care of the patient were reviewed by me and considered in my medical decision making (see chart for details).   Patient with muscle skeletal inflammation in left shoulder.  X-ray shows some degenerative changes.  Patient will be instructed to take at least one Ultram a day to help with the discomfort.  Patient is to follow-up with her orthopedic surgeon if not improving.      Patient requested a CT scan because hospice wanted her to get a CT scan of her head  Final Clinical Impressions(s) / ED Diagnoses   Final diagnoses:  None    ED Discharge Orders    None       Bethann Berkshire, MD 03/25/18 1547

## 2018-03-20 NOTE — Discharge Instructions (Addendum)
Patient should take 1  tramadol in the morning for her shoulder pain and she should follow-up with Dr. Romeo Apple orthopedic surgeon if necessary

## 2018-03-20 NOTE — ED Notes (Signed)
Adm keflex suspension due to difficulty swallowing and po not available in pyxsis

## 2018-03-20 NOTE — ED Notes (Signed)
Returned from CT.

## 2018-03-20 NOTE — ED Triage Notes (Signed)
Pt brought in by [facility] from [facility]. Pt c/o left arm pain from the shoulder down to the elbow. Pt reports the pain started about 3 days ago after she was assisted with transferring to the bathroom.

## 2018-03-23 LAB — URINE CULTURE

## 2018-03-24 ENCOUNTER — Non-Acute Institutional Stay: Payer: Self-pay | Admitting: Hospice and Palliative Medicine

## 2018-03-24 ENCOUNTER — Telehealth: Payer: Self-pay | Admitting: *Deleted

## 2018-03-24 DIAGNOSIS — Z515 Encounter for palliative care: Secondary | ICD-10-CM

## 2018-03-24 NOTE — Progress Notes (Signed)
PALLIATIVE CARE CONSULT VISIT   PATIENT NAME: Carrie Heath DOB: 1921/02/25 MRN: [id number]  PRIMARY CARE PROVIDER: Benita Stabile (Inactive)  REFERRING PROVIDER: Onsite  RESPONSIBLE PARTY:   Both sons   RECOMMENDATIONS and PLAN:  1. Weakness: secondary to UTI (Kleb. Pna) first dose of cipro given today..per C & S. I would continue this medication, encourage fluids, encourage protein supplements and monitor. If patient does not improve within the next week, re consider for hospice. No acute findings on CT scan. There are some chronic micro vessel disease indicative of dementia. Continue HHC for skin issues. Continue AL setting. May need to consider an alternative if not able to transfer patient. This facility is a no lift facility.  2. ACP: patient and family desire Hospice services when appropriate. I feel that we need to see if this acute infection resolves some of the decline we have seen.   I spent 20 minutes providing this consultation,  from 10:30  to 11:00am. More than 50% of the time in this consultation was spent coordinating communication.   HISTORY OF PRESENT ILLNESS:  Carrie Heath is a 84 y.o. female with multiple medical problems.  Palliative Care was asked to evaluate to see if she is now eligible for Hospice services.   CODE STATUS: DNR  PPS: 30% HOSPICE ELIGIBILITY/DIAGNOSIS: TBD  PAST MEDICAL HISTORY:  Past Medical History:  Diagnosis Date  . Bronchitis   . Chronic a-fib (HCC)   . Chronic atrial fibrillation (HCC)   . COPD (chronic obstructive pulmonary disease) (HCC)   . Diabetes mellitus without complication (HCC)   . Diastolic congestive heart failure (HCC)   . High cholesterol   . HTN (hypertension)    pt denies  . Hyperlipidemia   . Neuropathy     SOCIAL HX:  Social History   Tobacco Use  . Smoking status: Former Smoker    Packs/day: 0.50    Types: Cigarettes    Start date: 07/11/1944    Last attempt to quit: 11/11/2006    Years since  quitting: 11.3  . Smokeless tobacco: Former Engineer, water Use Topics  . Alcohol use: No    Alcohol/week: 0.0 oz    ALLERGIES: No Known Allergies   PERTINENT MEDICATIONS:  Outpatient Encounter Medications as of 03/24/2018  Medication Sig  . acetaminophen (TYLENOL) 325 MG tablet Take 650 mg by mouth 3 (three) times daily.  Marland Kitchen albuterol (PROAIR HFA) 108 (90 BASE) MCG/ACT inhaler Inhale 2 puffs into the lungs every 4 (four) hours as needed for wheezing or shortness of breath.  . bisacodyl (FLEET) 10 MG/30ML ENEM Place 10 mg rectally daily as needed (constipation).  . carboxymethylcellulose (REFRESH PLUS) 0.5 % SOLN Apply 1 drop to eye 3 (three) times daily as needed.  . cephALEXin (KEFLEX) 500 MG capsule Take 1 capsule (500 mg total) by mouth 4 (four) times daily.  Marland Kitchen diltiazem (CARDIZEM) 30 MG tablet Take 30 mg by mouth 4 (four) times daily.  Marland Kitchen docusate sodium (COLACE) 100 MG capsule Take 100 mg by mouth every other day.   . DULoxetine (CYMBALTA) 30 MG capsule Take 2 capsules by mouth at bedtime.   . feeding supplement, GLUCERNA SHAKE, (GLUCERNA SHAKE) LIQD Take 237 mLs by mouth every morning.  . furosemide (LASIX) 40 MG tablet TAKE (1) TABLET BY MOUTH TWICE DAILY. (Patient taking differently: TAKE 0.5 TABLET BY MOUTH ONCE DAILY.)  . hydrocortisone (ANUSOL-HC) 2.5 % rectal cream Place 1 application rectally 2 (two) times daily.  Marland Kitchen  Menthol-Methyl Salicylate (BENGAY GREASELESS EX) Apply 1 application topically 2 (two) times daily. Apply to knees topically two times a day for pain.  Marland Kitchen nystatin ointment (MYCOSTATIN) Apply 1 application topically. Apply affected areas topically as needed for fungal rash to abdominal folds, breasts and groin 2 times a day.  Marland Kitchen omeprazole (PRILOSEC) 20 MG capsule Take 1 capsule by mouth daily.  . polyethylene glycol (MIRALAX / GLYCOLAX) packet Take 17 g by mouth daily.  . predniSONE (DELTASONE) 5 MG tablet Take 1 tablet (5 mg total) by mouth daily with breakfast.  .  pregabalin (LYRICA) 25 MG capsule Take 25 mg by mouth 2 (two) times daily.  Marland Kitchen spironolactone (ALDACTONE) 25 MG tablet Take 25 mg by mouth daily.  . traMADol (ULTRAM) 50 MG tablet Take 50 mg by mouth every 12 (twelve) hours. Reported on 02/05/2016  . Zinc Oxide (DESITIN) 40 % PSTE Apply topically. Apply to gluteals three times a day for redness.   No facility-administered encounter medications on file as of 03/24/2018.     PHYSICAL EXAM:    General: Elderly, Caucasian female sitting in WC sleeping Cardiovascular: Irreg rhythm; reg rate Pulmonary: CTAB Abdomen: soft, active BS, NTTp Extremities: all four extremities weak; Right/Left shoulder pain and weakness Skin: thin, easily torn and bruised. Multiple areas of tears/bruising Neurological: Easily arouses; speaks in full sentences; gets confused about what she is trying to explain  Truett Perna, NP

## 2018-03-24 NOTE — Telephone Encounter (Signed)
Post ED Visit - Positive Culture Follow-up: Unsuccessful Patient Follow-up  Culture assessed and recommendations reviewed by:   Enzo Bi, Pharm.D.  Celedonio Miyamoto, Pharm.D., BCPS AQ-ID  Garvin Fila, Pharm.D., BCPS  Georgina Pillion, Pharm.D., BCPS  [place], [place].D., BCPS, AAHIVP  Estella Husk, Pharm.D., BCPS, AAHIVP  Sherlynn Carbon, PharmD  Pollyann Samples, PharmD, BCPS  Positive urine culture, reviewed by Leonia Corona, PA-C   Patient discharged without antimicrobial prescription and additional  treatment is not indicated  Organism is resistant to prescribed ED discharge antimicrobial, stop Keflex and give Fosfomycin 3gm x 1 dose.  Patient with positive blood cultures   Unable to contact patient after 3 attempts, letter will be sent to address on file  Lysle Pearl 03/24/2018, 1:57 PM

## 2018-03-29 ENCOUNTER — Emergency Department (HOSPITAL_COMMUNITY): Payer: Medicare Other

## 2018-03-29 ENCOUNTER — Encounter (HOSPITAL_COMMUNITY): Payer: Self-pay | Admitting: Emergency Medicine

## 2018-03-29 ENCOUNTER — Other Ambulatory Visit: Payer: Self-pay

## 2018-03-29 ENCOUNTER — Inpatient Hospital Stay (HOSPITAL_COMMUNITY)
Admission: EM | Admit: 2018-03-29 | Discharge: 2018-04-02 | DRG: 871 | Disposition: A | Discharge status: Hospice | Payer: Medicare Other | Attending: Internal Medicine | Admitting: Internal Medicine

## 2018-03-29 DIAGNOSIS — K219 Gastro-esophageal reflux disease without esophagitis: Secondary | ICD-10-CM | POA: Diagnosis present

## 2018-03-29 DIAGNOSIS — F0150 Vascular dementia without behavioral disturbance: Secondary | ICD-10-CM | POA: Diagnosis not present

## 2018-03-29 DIAGNOSIS — R6520 Severe sepsis without septic shock: Secondary | ICD-10-CM | POA: Diagnosis present

## 2018-03-29 DIAGNOSIS — I361 Nonrheumatic tricuspid (valve) insufficiency: Secondary | ICD-10-CM | POA: Diagnosis not present

## 2018-03-29 DIAGNOSIS — I10 Essential (primary) hypertension: Secondary | ICD-10-CM | POA: Diagnosis present

## 2018-03-29 DIAGNOSIS — Z87891 Personal history of nicotine dependence: Secondary | ICD-10-CM

## 2018-03-29 DIAGNOSIS — E1142 Type 2 diabetes mellitus with diabetic polyneuropathy: Secondary | ICD-10-CM | POA: Diagnosis not present

## 2018-03-29 DIAGNOSIS — I5032 Chronic diastolic (congestive) heart failure: Secondary | ICD-10-CM | POA: Diagnosis not present

## 2018-03-29 DIAGNOSIS — Z7189 Other specified counseling: Secondary | ICD-10-CM | POA: Diagnosis not present

## 2018-03-29 DIAGNOSIS — N390 Urinary tract infection, site not specified: Secondary | ICD-10-CM | POA: Diagnosis present

## 2018-03-29 DIAGNOSIS — E872 Acidosis: Secondary | ICD-10-CM | POA: Diagnosis present

## 2018-03-29 DIAGNOSIS — L97529 Non-pressure chronic ulcer of other part of left foot with unspecified severity: Secondary | ICD-10-CM | POA: Diagnosis present

## 2018-03-29 DIAGNOSIS — E860 Dehydration: Secondary | ICD-10-CM | POA: Diagnosis present

## 2018-03-29 DIAGNOSIS — I482 Chronic atrial fibrillation, unspecified: Secondary | ICD-10-CM | POA: Diagnosis present

## 2018-03-29 DIAGNOSIS — Z79899 Other long term (current) drug therapy: Secondary | ICD-10-CM | POA: Diagnosis not present

## 2018-03-29 DIAGNOSIS — Z515 Encounter for palliative care: Secondary | ICD-10-CM | POA: Diagnosis not present

## 2018-03-29 DIAGNOSIS — Z66 Do not resuscitate: Secondary | ICD-10-CM | POA: Diagnosis present

## 2018-03-29 DIAGNOSIS — Z8249 Family history of ischemic heart disease and other diseases of the circulatory system: Secondary | ICD-10-CM | POA: Diagnosis not present

## 2018-03-29 DIAGNOSIS — A4150 Gram-negative sepsis, unspecified: Secondary | ICD-10-CM | POA: Diagnosis not present

## 2018-03-29 DIAGNOSIS — A4159 Other Gram-negative sepsis: Secondary | ICD-10-CM | POA: Diagnosis present

## 2018-03-29 DIAGNOSIS — F329 Major depressive disorder, single episode, unspecified: Secondary | ICD-10-CM | POA: Diagnosis present

## 2018-03-29 DIAGNOSIS — I110 Hypertensive heart disease with heart failure: Secondary | ICD-10-CM | POA: Diagnosis present

## 2018-03-29 DIAGNOSIS — R7881 Bacteremia: Secondary | ICD-10-CM | POA: Diagnosis not present

## 2018-03-29 DIAGNOSIS — I330 Acute and subacute infective endocarditis: Secondary | ICD-10-CM | POA: Diagnosis present

## 2018-03-29 DIAGNOSIS — A419 Sepsis, unspecified organism: Secondary | ICD-10-CM

## 2018-03-29 DIAGNOSIS — A4102 Sepsis due to Methicillin resistant Staphylococcus aureus: Secondary | ICD-10-CM | POA: Diagnosis present

## 2018-03-29 DIAGNOSIS — Z7952 Long term (current) use of systemic steroids: Secondary | ICD-10-CM

## 2018-03-29 DIAGNOSIS — J449 Chronic obstructive pulmonary disease, unspecified: Secondary | ICD-10-CM | POA: Diagnosis present

## 2018-03-29 DIAGNOSIS — E785 Hyperlipidemia, unspecified: Secondary | ICD-10-CM | POA: Diagnosis not present

## 2018-03-29 DIAGNOSIS — Z90710 Acquired absence of both cervix and uterus: Secondary | ICD-10-CM

## 2018-03-29 DIAGNOSIS — G92 Toxic encephalopathy: Secondary | ICD-10-CM | POA: Diagnosis present

## 2018-03-29 DIAGNOSIS — E7800 Pure hypercholesterolemia, unspecified: Secondary | ICD-10-CM | POA: Diagnosis present

## 2018-03-29 DIAGNOSIS — R4182 Altered mental status, unspecified: Secondary | ICD-10-CM | POA: Diagnosis present

## 2018-03-29 DIAGNOSIS — I480 Paroxysmal atrial fibrillation: Secondary | ICD-10-CM | POA: Diagnosis present

## 2018-03-29 DIAGNOSIS — I371 Nonrheumatic pulmonary valve insufficiency: Secondary | ICD-10-CM | POA: Diagnosis not present

## 2018-03-29 DIAGNOSIS — Z87440 Personal history of urinary (tract) infections: Secondary | ICD-10-CM

## 2018-03-29 DIAGNOSIS — E11621 Type 2 diabetes mellitus with foot ulcer: Secondary | ICD-10-CM | POA: Diagnosis present

## 2018-03-29 LAB — URINALYSIS, ROUTINE W REFLEX MICROSCOPIC
Bilirubin Urine: NEGATIVE
Glucose, UA: NEGATIVE mg/dL
KETONES UR: NEGATIVE mg/dL
Nitrite: NEGATIVE
PH: 5 (ref 5.0–8.0)
Protein, ur: 30 mg/dL — AB
SPECIFIC GRAVITY, URINE: 1.014 (ref 1.005–1.030)
WBC, UA: >50 WBC/hpf — ABNORMAL HIGH (ref 0–5)

## 2018-03-29 LAB — CBC WITH DIFFERENTIAL/PLATELET
BASOS ABS: 0 10*3/uL (ref 0.0–0.1)
Basophils Relative: 0 %
EOS PCT: 0 %
Eosinophils Absolute: 0 10*3/uL (ref 0.0–0.7)
HCT: 41.2 % (ref 36.0–46.0)
Hemoglobin: 13.4 g/dL (ref 12.0–15.0)
Lymphocytes Relative: 3 %
Lymphs Abs: 0.7 10*3/uL (ref 0.7–4.0)
MCH: 32.3 pg (ref 26.0–34.0)
MCHC: 32.5 g/dL (ref 30.0–36.0)
MCV: 99.3 fL (ref 78.0–100.0)
MONO ABS: 1.2 10*3/uL — AB (ref 0.1–1.0)
MONOS PCT: 5 %
Neutro Abs: 22.4 10*3/uL — ABNORMAL HIGH (ref 1.7–7.7)
Neutrophils Relative %: 92 %
Platelets: 265 10*3/uL (ref 150–400)
RBC: 4.15 MIL/uL (ref 3.87–5.11)
RDW: 13.6 % (ref 11.5–15.5)
WBC MORPHOLOGY: INCREASED
WBC: 24.3 10*3/uL — AB (ref 4.0–10.5)

## 2018-03-29 LAB — COMPREHENSIVE METABOLIC PANEL
ALT: 42 U/L (ref 14–54)
AST: 41 U/L (ref 15–41)
Albumin: 2.6 g/dL — ABNORMAL LOW (ref 3.5–5.0)
Alkaline Phosphatase: 97 U/L (ref 38–126)
Anion gap: 12 (ref 5–15)
BUN: 35 mg/dL — ABNORMAL HIGH (ref 6–20)
CHLORIDE: 96 mmol/L — AB (ref 101–111)
CO2: 26 mmol/L (ref 22–32)
CREATININE: 1.18 mg/dL — AB (ref 0.44–1.00)
Calcium: 8.7 mg/dL — ABNORMAL LOW (ref 8.9–10.3)
GFR, EST AFRICAN AMERICAN: 44 mL/min — AB (ref >60)
GFR, EST NON AFRICAN AMERICAN: 38 mL/min — AB (ref >60)
Glucose, Bld: 210 mg/dL — ABNORMAL HIGH (ref 65–99)
Potassium: 5.2 mmol/L — ABNORMAL HIGH (ref 3.5–5.1)
Sodium: 134 mmol/L — ABNORMAL LOW (ref 135–145)
TOTAL PROTEIN: 6.8 g/dL (ref 6.5–8.1)
Total Bilirubin: 1.1 mg/dL (ref 0.3–1.2)

## 2018-03-29 LAB — GLUCOSE, CAPILLARY
GLUCOSE-CAPILLARY: 232 mg/dL — AB (ref 65–99)
Glucose-Capillary: 228 mg/dL — ABNORMAL HIGH (ref 65–99)

## 2018-03-29 LAB — MAGNESIUM: Magnesium: 1.7 mg/dL (ref 1.7–2.4)

## 2018-03-29 LAB — TSH: TSH: 0.484 u[IU]/mL (ref 0.350–4.500)

## 2018-03-29 LAB — PHOSPHORUS: PHOSPHORUS: 3 mg/dL (ref 2.5–4.6)

## 2018-03-29 LAB — HEMOGLOBIN A1C
Hgb A1c MFr Bld: 7.5 % — ABNORMAL HIGH (ref 4.8–5.6)
MEAN PLASMA GLUCOSE: 168.55 mg/dL

## 2018-03-29 LAB — MRSA PCR SCREENING: MRSA by PCR: POSITIVE — AB

## 2018-03-29 LAB — LACTIC ACID, PLASMA
Lactic Acid, Venous: 2.1 mmol/L (ref 0.5–1.9)
Lactic Acid, Venous: 2.1 mmol/L (ref 0.5–1.9)

## 2018-03-29 MED ORDER — PREDNISONE 10 MG PO TABS
5.0000 mg | ORAL_TABLET | Freq: Every day | ORAL | Status: DC
  Administered 2018-03-30 – 2018-04-01 (×3): 5 mg via ORAL
  Filled 2018-03-29 (×4): qty 1

## 2018-03-29 MED ORDER — SODIUM CHLORIDE 0.9 % IV SOLN
INTRAVENOUS | Status: DC
  Administered 2018-03-29 – 2018-04-01 (×7): via INTRAVENOUS

## 2018-03-29 MED ORDER — IPRATROPIUM-ALBUTEROL 0.5-2.5 (3) MG/3ML IN SOLN: 3.0000 mL | Freq: Four times a day (QID) | RESPIRATORY_TRACT | Status: DC | PRN

## 2018-03-29 MED ORDER — POLYETHYLENE GLYCOL 3350 17 G PO PACK
17.0000 g | PACK | Freq: Every day | ORAL | Status: DC
  Administered 2018-03-29 – 2018-04-01 (×4): 17 g via ORAL
  Filled 2018-03-29 (×4): qty 1

## 2018-03-29 MED ORDER — SODIUM CHLORIDE 0.9% FLUSH: 3.0000 mL | INTRAVENOUS | Status: DC | PRN

## 2018-03-29 MED ORDER — DULOXETINE HCL 60 MG PO CPEP
60.0000 mg | ORAL_CAPSULE | Freq: Every day | ORAL | Status: DC
  Administered 2018-03-29 – 2018-03-31 (×3): 60 mg via ORAL
  Filled 2018-03-29 (×4): qty 1

## 2018-03-29 MED ORDER — PANTOPRAZOLE SODIUM 40 MG PO TBEC
40.0000 mg | DELAYED_RELEASE_TABLET | Freq: Every day | ORAL | Status: DC
  Administered 2018-03-29 – 2018-04-01 (×4): 40 mg via ORAL
  Filled 2018-03-29 (×5): qty 1

## 2018-03-29 MED ORDER — SODIUM CHLORIDE 0.9 % IV SOLN: 250.0000 mL | INTRAVENOUS | Status: DC | PRN

## 2018-03-29 MED ORDER — INSULIN ASPART 100 UNIT/ML ~~LOC~~ SOLN
0.0000 [IU] | Freq: Three times a day (TID) | SUBCUTANEOUS | Status: DC
  Administered 2018-03-29: 3 [IU] via SUBCUTANEOUS
  Administered 2018-03-30: 1 [IU] via SUBCUTANEOUS
  Administered 2018-03-30: 2 [IU] via SUBCUTANEOUS
  Administered 2018-03-30 – 2018-03-31 (×2): 1 [IU] via SUBCUTANEOUS
  Administered 2018-03-31: 3 [IU] via SUBCUTANEOUS
  Administered 2018-03-31: 1 [IU] via SUBCUTANEOUS
  Administered 2018-04-01: 2 [IU] via SUBCUTANEOUS
  Administered 2018-04-01: 1 [IU] via SUBCUTANEOUS

## 2018-03-29 MED ORDER — CEFEPIME HCL 2 G IJ SOLR
2.0000 g | Freq: Once | INTRAMUSCULAR | Status: AC
  Administered 2018-03-29: 2 g via INTRAVENOUS
  Filled 2018-03-29: qty 2

## 2018-03-29 MED ORDER — SODIUM CHLORIDE 0.9 % IV BOLUS (SEPSIS)
1000.0000 mL | Freq: Once | INTRAVENOUS | Status: AC
  Administered 2018-03-29: 1000 mL via INTRAVENOUS

## 2018-03-29 MED ORDER — VANCOMYCIN HCL IN DEXTROSE 1-5 GM/200ML-% IV SOLN
1000.0000 mg | Freq: Once | INTRAVENOUS | Status: AC
  Administered 2018-03-29: 1000 mg via INTRAVENOUS
  Filled 2018-03-29: qty 200

## 2018-03-29 MED ORDER — IBUPROFEN 600 MG PO TABS
600.0000 mg | ORAL_TABLET | Freq: Once | ORAL | Status: AC
Start: 2018-03-29 — End: 2018-03-29
  Administered 2018-03-29: 600 mg via ORAL
  Filled 2018-03-29: qty 1

## 2018-03-29 MED ORDER — PREGABALIN 25 MG PO CAPS
25.0000 mg | ORAL_CAPSULE | Freq: Two times a day (BID) | ORAL | Status: DC
  Administered 2018-03-29 – 2018-04-01 (×6): 25 mg via ORAL
  Filled 2018-03-29 (×8): qty 1

## 2018-03-29 MED ORDER — POLYVINYL ALCOHOL 1.4 % OP SOLN: 1.0000 [drp] | Freq: Three times a day (TID) | OPHTHALMIC | Status: DC | PRN

## 2018-03-29 MED ORDER — ONDANSETRON HCL 4 MG/2ML IJ SOLN: 4.0000 mg | Freq: Four times a day (QID) | INTRAMUSCULAR | Status: DC | PRN

## 2018-03-29 MED ORDER — ACETAMINOPHEN 325 MG PO TABS
650.0000 mg | ORAL_TABLET | Freq: Four times a day (QID) | ORAL | Status: DC | PRN
  Administered 2018-03-29 – 2018-03-31 (×4): 650 mg via ORAL
  Filled 2018-03-29 (×4): qty 2

## 2018-03-29 MED ORDER — CARBOXYMETHYLCELLULOSE SODIUM 0.5 % OP SOLN: 1.0000 [drp] | Freq: Three times a day (TID) | OPHTHALMIC | Status: DC | PRN

## 2018-03-29 MED ORDER — METOPROLOL TARTRATE 25 MG PO TABS
25.0000 mg | ORAL_TABLET | Freq: Two times a day (BID) | ORAL | Status: DC
  Administered 2018-03-29 – 2018-03-31 (×3): 25 mg via ORAL
  Filled 2018-03-29 (×4): qty 1

## 2018-03-29 MED ORDER — GLUCERNA SHAKE PO LIQD
237.0000 mL | Freq: Two times a day (BID) | ORAL | Status: DC
Start: 2018-03-30 — End: 2018-04-02

## 2018-03-29 MED ORDER — SODIUM CHLORIDE 0.9 % IV SOLN
1.0000 g | Freq: Three times a day (TID) | INTRAVENOUS | Status: DC
  Administered 2018-03-29 – 2018-03-30 (×3): 1 g via INTRAVENOUS
  Filled 2018-03-29 (×7): qty 1

## 2018-03-29 MED ORDER — HYDROCORTISONE 2.5 % RE CREA
1.0000 "application " | TOPICAL_CREAM | Freq: Two times a day (BID) | RECTAL | Status: DC
  Administered 2018-03-30 – 2018-04-02 (×7): 1 via RECTAL
  Filled 2018-03-29: qty 28.35

## 2018-03-29 MED ORDER — ORAL CARE MOUTH RINSE
15.0000 mL | Freq: Two times a day (BID) | OROMUCOSAL | Status: DC
  Administered 2018-03-29 – 2018-04-02 (×7): 15 mL via OROMUCOSAL

## 2018-03-29 MED ORDER — ONDANSETRON HCL 4 MG PO TABS: 4.0000 mg | ORAL_TABLET | Freq: Four times a day (QID) | ORAL | Status: DC | PRN

## 2018-03-29 MED ORDER — IBUPROFEN 600 MG PO TABS
600.0000 mg | ORAL_TABLET | Freq: Three times a day (TID) | ORAL | Status: DC | PRN
  Administered 2018-03-30 (×2): 600 mg via ORAL
  Filled 2018-03-29 (×2): qty 1

## 2018-03-29 MED ORDER — DOCUSATE SODIUM 100 MG PO CAPS
100.0000 mg | ORAL_CAPSULE | ORAL | Status: DC
  Administered 2018-03-29: 100 mg via ORAL
  Filled 2018-03-29 (×4): qty 1

## 2018-03-29 MED ORDER — ACETAMINOPHEN 650 MG RE SUPP
650.0000 mg | Freq: Once | RECTAL | Status: AC
  Administered 2018-03-29: 650 mg via RECTAL
  Filled 2018-03-29: qty 1

## 2018-03-29 MED ORDER — HEPARIN SODIUM (PORCINE) 5000 UNIT/ML IJ SOLN
5000.0000 [IU] | Freq: Three times a day (TID) | INTRAMUSCULAR | Status: DC
  Administered 2018-03-29 – 2018-04-02 (×13): 5000 [IU] via SUBCUTANEOUS
  Filled 2018-03-29 (×12): qty 1

## 2018-03-29 MED ORDER — GLUCERNA SHAKE PO LIQD
237.0000 mL | Freq: Three times a day (TID) | ORAL | Status: DC
  Administered 2018-03-29 – 2018-04-01 (×3): 237 mL via ORAL

## 2018-03-29 MED ORDER — SODIUM CHLORIDE 0.9% FLUSH
3.0000 mL | Freq: Two times a day (BID) | INTRAVENOUS | Status: DC
  Administered 2018-03-29 – 2018-04-01 (×6): 3 mL via INTRAVENOUS

## 2018-03-29 NOTE — ED Notes (Signed)
Foly inserted

## 2018-03-29 NOTE — Progress Notes (Signed)
Pharmacy Note:  Initial antibiotic(s) regimen of Cefepime ordered by EDP to treat UTI.  CrCl cannot be calculated (Patient's most recent lab result is older than the maximum 21 days allowed.).   No Known Allergies  Vitals:   03/29/18 1115  Temp: (!) 103.8 F (39.9 C)    Anti-infectives (From admission, onward)   Start     Dose/Rate Route Frequency Ordered Stop   03/29/18 1130  ceFEPIme (MAXIPIME) 2 g in sodium chloride 0.9 % 100 mL IVPB     2 g 200 mL/hr over 30 Minutes Intravenous  Once 03/29/18 1126        Plan: Initial dose(s) of Cefepime 2gm X 1 ordered. F/U admission orders for further dosing if therapy continued.  Wayland Denis, [facility] 03/29/2018 11:28 AM a

## 2018-03-29 NOTE — ED Notes (Signed)
Report given to Jessica, RN.

## 2018-03-29 NOTE — Progress Notes (Signed)
CRITICAL VALUE ALERT  Critical Value:  Lactic Acid  2.1  Date & Time Notied:  03/29/18 1420  Provider Notified: Dr. Gwenlyn Perking  Orders Received/Actions taken:

## 2018-03-29 NOTE — ED Triage Notes (Signed)
Pt from [facility] [place] for altered mental status since Thursday.  Known UTI with Cipro as tx.

## 2018-03-29 NOTE — ED Provider Notes (Addendum)
[facility] EMERGENCY DEPARTMENT Provider Note   CSN: [id number] Arrival date & time: 03/29/18  1107     History   Chief Complaint Chief Complaint  Patient presents with  . Fever    HPI Carrie Heath is a 84 y.o. female.  Level 5 caveat for acuity of condition.  Patient resides in [place] abd presents with fever and altered mental status.  Per nursing history, patient was recently put on Cipro for urinary tract infection.  She is now running a fever and is more confused.     Past Medical History:  Diagnosis Date  . Bronchitis   . Chronic a-fib (HCC)   . Chronic atrial fibrillation (HCC)   . COPD (chronic obstructive pulmonary disease) (HCC)   . Diabetes mellitus without complication (HCC)   . Diastolic congestive heart failure (HCC)   . High cholesterol   . HTN (hypertension)    pt denies  . Hyperlipidemia   . Neuropathy     Patient Active Problem List   Diagnosis Date Noted  . Edema, peripheral 12/13/2014  . Accelerated hypertension 11/15/2014  . Acute on chronic diastolic congestive heart failure (HCC) 11/15/2014  . Atrial fibrillation with RVR (HCC) 10/15/2014  . Acute on chronic diastolic CHF (congestive heart failure) (HCC) 10/15/2014  . Acute bronchitis with chronic obstructive pulmonary disease (COPD) (HCC) 10/15/2014  . SOB (shortness of breath) 10/15/2014  . Atrial fibrillation with controlled ventricular response (HCC) 02/17/2012  . Hip pain, bilateral 04/25/2011  . Spinal stenosis 04/25/2011  . Weakness of both legs 04/25/2011  . COPD exacerbation (HCC) 08/18/2009  . KNEE, ARTHRITIS, DEGEN./OSTEO 05/01/2009  . KNEE PAIN 05/01/2009  . Lung nodule 02/15/2009  . DM type 2 with diabetic peripheral neuropathy (HCC) 01/13/2009  . HYPERLIPIDEMIA 01/13/2009  . COPD mixed type (HCC) 01/13/2009    Past Surgical History:  Procedure Laterality Date  . APPENDECTOMY    . BLADDER SURGERY    . CHOLECYSTECTOMY    . THYROIDECTOMY, PARTIAL     remote    . TONSILLECTOMY    . TOTAL ABDOMINAL HYSTERECTOMY       OB History   None      Home Medications    Prior to Admission medications   Medication Sig Start Date End Date Taking? Authorizing Provider  acetaminophen (TYLENOL) 325 MG tablet Take 650 mg by mouth 3 (three) times daily.   Yes [provider]  albuterol (PROAIR HFA) 108 (90 BASE) MCG/ACT inhaler Inhale 2 puffs into the lungs every 4 (four) hours as needed for wheezing or shortness of breath. 10/15/13 03/29/18 Yes Young, Clinton D, MD  bisacodyl (FLEET) 10 MG/30ML ENEM Place 10 mg rectally daily as needed (constipation).   Yes [provider]  carboxymethylcellulose (REFRESH PLUS) 0.5 % SOLN Apply 1 drop to eye 3 (three) times daily as needed.   Yes [provider]  ciprofloxacin (CIPRO) 250 MG tablet Take 250 mg by mouth 2 (two) times daily. For UTI for seven days. 03/24/18  Yes [provider]  diltiazem (CARDIZEM) 30 MG tablet Take 30 mg by mouth 4 (four) times daily.   Yes [provider]  docusate sodium (COLACE) 100 MG capsule Take 100 mg by mouth every other day.    Yes [provider]  DULoxetine (CYMBALTA) 30 MG capsule Take 2 capsules by mouth at bedtime.  07/26/15  Yes [provider]  feeding supplement, GLUCERNA SHAKE, (GLUCERNA SHAKE) LIQD Take 237 mLs by mouth every morning.   Yes  [provider]  furosemide (LASIX) 40 MG tablet TAKE (1) TABLET BY MOUTH TWICE DAILY. Patient taking differently: TAKE 0.5 TABLET BY MOUTH ONCE DAILY. 02/01/15  Yes Laqueta Linden, MD  hydrocortisone (ANUSOL-HC) 2.5 % rectal cream Place 1 application rectally 2 (two) times daily.   Yes [provider]  Menthol-Methyl Salicylate (BENGAY GREASELESS EX) Apply 1 application topically 2 (two) times daily. Apply to knees topically two times a day for pain.   Yes [provider]  mineral oil liquid Take by mouth daily as needed for moderate constipation.  Instill 2 drop in right ear one time a day for seven days for wax build up.   Yes [provider]  nystatin ointment (MYCOSTATIN) Apply 1 application topically. Apply affected areas topically as needed for fungal rash to abdominal folds, breasts and groin 2 times a day.   Yes [provider]  omeprazole (PRILOSEC) 20 MG capsule Take 1 capsule by mouth daily. 09/10/13  Yes [provider]  polyethylene glycol (MIRALAX / GLYCOLAX) packet Take 17 g by mouth daily.   Yes [provider]  predniSONE (DELTASONE) 5 MG tablet Take 1 tablet (5 mg total) by mouth daily with breakfast. 01/11/15  Yes Young, Clinton D, MD  pregabalin (LYRICA) 25 MG capsule Take 25 mg by mouth 2 (two) times daily.   Yes [provider]  spironolactone (ALDACTONE) 25 MG tablet Take 25 mg by mouth daily.   Yes [provider]  traMADol (ULTRAM) 50 MG tablet Take 50 mg by mouth every 12 (twelve) hours. Reported on 02/05/2016   Yes [provider]  Zinc Oxide (DESITIN) 40 % PSTE Apply topically. Apply to gluteals three times a day for redness.   Yes [provider]  cephALEXin (KEFLEX) 500 MG capsule Take 1 capsule (500 mg total) by mouth 4 (four) times daily. Patient not taking: Reported on 03/29/2018 03/20/18   Margarita Grizzle, MD    Family History Family History  Problem Relation Age of Onset  . Heart disease Father   . Alzheimer's disease Mother   . Heart disease Brother        CABG    Social History Social History   Tobacco Use  . Smoking status: Former Smoker    Packs/day: 0.50    Types: Cigarettes    Start date: 07/11/1944    Last attempt to quit: 11/11/2006    Years since quitting: 11.3  . Smokeless tobacco: Former Engineer, water Use Topics  . Alcohol use: No    Alcohol/week: 0.0 oz  . Drug use: No     Allergies   Patient has no known allergies.   Review of Systems Review of Systems  Unable to perform ROS: Acuity of condition      Physical Exam Updated Vital Signs BP 111/70   Pulse (!) 108   Temp (!) 101.7 F (38.7 C)   Resp (!) 25   Wt 59 kg (130 lb)   SpO2 94%   BMI 22.31 kg/m   Physical Exam  Constitutional:  Febrile, tachycardic, confused  HENT:  Head: Normocephalic and atraumatic.  Eyes: Conjunctivae are normal.  Neck: Neck supple.  Cardiovascular: Regular rhythm.  tachy  Pulmonary/Chest: Effort normal and breath sounds normal.  Abdominal: Soft. Bowel sounds are normal.  Musculoskeletal: Normal range of motion.  Neurological:  unable  Skin:  flushed  Psychiatric:  unable  Nursing note and vitals reviewed.    ED Treatments / Results  Labs (all labs  ordered are listed, but only abnormal results are displayed) Labs Reviewed  COMPREHENSIVE METABOLIC PANEL - Abnormal; Notable for the following components:      Result Value   Sodium 134 (*)    Potassium 5.2 (*)    Chloride 96 (*)    Glucose, Bld 210 (*)    BUN 35 (*)    Creatinine, Ser 1.18 (*)    Calcium 8.7 (*)    Albumin 2.6 (*)    GFR calc non Af Amer 38 (*)    GFR calc Af Amer 44 (*)    All other components within normal limits  CBC WITH DIFFERENTIAL/PLATELET - Abnormal; Notable for the following components:   WBC 24.3 (*)    Neutro Abs 22.4 (*)    Monocytes Absolute 1.2 (*)    All other components within normal limits  URINALYSIS, ROUTINE W REFLEX MICROSCOPIC - Abnormal; Notable for the following components:   APPearance CLOUDY (*)    Hgb urine dipstick SMALL (*)    Protein, ur 30 (*)    Leukocytes, UA MODERATE (*)    WBC, UA >50 (*)    Bacteria, UA MANY (*)    All other components within normal limits  LACTIC ACID, PLASMA - Abnormal; Notable for the following components:   Lactic Acid, Venous 2.1 (*)    All other components within normal limits  CULTURE, BLOOD (ROUTINE X 2)  CULTURE, BLOOD (ROUTINE X 2)  LACTIC ACID, PLASMA    EKG EKG Interpretation  Date/Time:  Sunday Mar 29 2018 11:20:57  EDT Ventricular Rate:  119 PR Interval:    QRS Duration: 84 QT Interval:  283 QTC Calculation: 399 R Axis:   -60 Text Interpretation:  Atrial fibrillation Left anterior fascicular block Anterior infarct, old Confirmed by Donnetta Hutching ([id number]) on 03/29/2018 11:31:36 AM   Radiology No results found.  Procedures Procedures (including critical care time)  Medications Ordered in ED Medications  sodium chloride 0.9 % bolus 1,000 mL (1,000 mLs Intravenous New Bag/Given 03/29/18 1209)    And  sodium chloride 0.9 % bolus 1,000 mL (1,000 mLs Intravenous New Bag/Given 03/29/18 1134)  vancomycin (VANCOCIN) IVPB 1000 mg/200 mL premix (has no administration in time range)  ceFEPIme (MAXIPIME) 2 g in sodium chloride 0.9 % 100 mL IVPB (0 g Intravenous Stopped 03/29/18 1210)  acetaminophen (TYLENOL) suppository 650 mg (650 mg Rectal Given 03/29/18 1131)     Initial Impression / Assessment and Plan / ED Course  I have reviewed the triage vital signs and the nursing notes.  Pertinent labs & imaging results that were available during my care of the patient were reviewed by me and considered in my medical decision making (see chart for details).     Patient presents with fever and altered mental status.  She is tachycardic, but blood pressure is stable.  White count 24 K.  Urinalysis shows evidence of infection.  Lactate 2.1.  Will initiate sepsis protocol.  Start Maxipime and vancomycin.  Admit to general medicine.   CRITICAL CARE Performed by: Donnetta Hutching Total critical care time: 35 minutes Critical care time was exclusive of separately billable procedures and treating other patients. Critical care was necessary to treat or prevent imminent or life-threatening deterioration. Critical care was time spent personally by me on the following activities: development of treatment plan with patient and/or surrogate as well as nursing, discussions with consultants, evaluation of patient's response to  treatment, examination of patient, obtaining history from patient or surrogate, ordering and performing treatments  and interventions, ordering and review of laboratory studies, ordering and review of radiographic studies, pulse oximetry and re-evaluation of patient's condition.  Final Clinical Impressions(s) / ED Diagnoses   Final diagnoses:  Sepsis, due to unspecified organism [facility])    ED Discharge Orders    None       Donnetta Hutching, MD 03/29/18 1231    Donnetta Hutching, MD 03/29/18 1252

## 2018-03-29 NOTE — ED Notes (Signed)
Date and time results received: 03/29/18 1212  Test: Lactic Acid Critical Value: 2.1  Name of Provider Notified: Adriana Simas  Orders Received? Or Actions Taken?: no new orders at this time.

## 2018-03-29 NOTE — H&P (Signed)
History and Physical    Carrie Heath:[id number] DOB: 19-Nov-1920 DOA: 03/29/2018  PCP: Benita Stabile (Inactive)   I have briefly reviewed patients previous medical reports in [facility].  Patient coming from: SNF [facility])  Chief Complaint: AMS, fever  HPI: Carrie Heath is a 84 y/o with PMH significant for HTN, vascular dementia, diastolic HF, HLD, chronic atrial fibrillation (not on anticoagulation), hx of recurrent UTI and type 2 diabetes with neuropathy; who was brought from SNF due to fever and AMS. Patient was found on 03/20/18 to have another episode of UTI (sensitivity demonstrated K. Pneumoniae ESBL); she was treated with keflex and then cipro (unfortunately, microorhanism was resistant to both antibiotics). Patient condition continue deteriorating, she continue spiking fever, not eating, no drinking much, increase weakness and became more confuse.  No CP, no SOB, no nausea, no vomiting, no new focal deficit, no hematuria, no melena, no hematochezia or any other complaints.   Of note, info provided by Son at bedside, patient too confuse to elaborate history.  ED Course: patient found with sepsis due to UTI, mild elevation of lactic acid seen; received gentle IVF's, cultures taken and a dose of vanc and cefepime given. CT head w/o acute abnormalities and CXR demonstrating left lower lob atelectasis and pleural effusion (appears unchanged since February). [facility] called to admit patient for further evaluation and management of sepsis due to UTI.  Review of Systems:  Unable to perform proper review of systems given patient's mentation; but appears to be neg except as mention on HPI (according to son and using home reports).  Past Medical History:  Diagnosis Date  . Bronchitis   . Chronic a-fib (HCC)   . Chronic atrial fibrillation (HCC)   . COPD (chronic obstructive pulmonary disease) (HCC)   . Diabetes mellitus without complication (HCC)   . Diastolic congestive heart  failure (HCC)   . High cholesterol   . HTN (hypertension)    pt denies  . Hyperlipidemia   . Neuropathy     Past Surgical History:  Procedure Laterality Date  . APPENDECTOMY    . BLADDER SURGERY    . CHOLECYSTECTOMY    . THYROIDECTOMY, PARTIAL     remote  . TONSILLECTOMY    . TOTAL ABDOMINAL HYSTERECTOMY      Social History  reports that she quit smoking about 11 years ago. Her smoking use included cigarettes. She started smoking about 73 years ago. She smoked 0.50 packs per day. She has quit using smokeless tobacco. She reports that she does not drink alcohol or use drugs.  No Known Allergies  Family History  Problem Relation Age of Onset  . Heart disease Father   . Alzheimer's disease Mother   . Heart disease Brother        CABG    Prior to Admission medications   Medication Sig Start Date End Date Taking? Authorizing Provider  acetaminophen (TYLENOL) 325 MG tablet Take 650 mg by mouth 3 (three) times daily.   Yes [provider]  albuterol (PROAIR HFA) 108 (90 BASE) MCG/ACT inhaler Inhale 2 puffs into the lungs every 4 (four) hours as needed for wheezing or shortness of breath. 10/15/13 03/29/18 Yes Young, Clinton D, MD  bisacodyl (FLEET) 10 MG/30ML ENEM Place 10 mg rectally daily as needed (constipation).   Yes [provider]  carboxymethylcellulose (REFRESH PLUS) 0.5 % SOLN Apply 1 drop to eye 3 (three) times daily as needed.   Yes [provider]  ciprofloxacin (  CIPRO) 250 MG tablet Take 250 mg by mouth 2 (two) times daily. For UTI for seven days. 03/24/18  Yes [provider]  diltiazem (CARDIZEM) 30 MG tablet Take 30 mg by mouth 4 (four) times daily.   Yes [provider]  docusate sodium (COLACE) 100 MG capsule Take 100 mg by mouth every other day.    Yes [provider]  DULoxetine (CYMBALTA) 30 MG capsule Take 2 capsules by mouth at bedtime.  07/26/15  Yes [provider]  feeding supplement, GLUCERNA  SHAKE, (GLUCERNA SHAKE) LIQD Take 237 mLs by mouth every morning.   Yes [provider]  furosemide (LASIX) 40 MG tablet TAKE (1) TABLET BY MOUTH TWICE DAILY. Patient taking differently: TAKE 0.5 TABLET BY MOUTH ONCE DAILY. 02/01/15  Yes Laqueta Linden, MD  hydrocortisone (ANUSOL-HC) 2.5 % rectal cream Place 1 application rectally 2 (two) times daily.   Yes [provider]  Menthol-Methyl Salicylate (BENGAY GREASELESS EX) Apply 1 application topically 2 (two) times daily. Apply to knees topically two times a day for pain.   Yes [provider]  mineral oil liquid Take by mouth daily as needed for moderate constipation. Instill 2 drop in right ear one time a day for seven days for wax build up.   Yes [provider]  nystatin ointment (MYCOSTATIN) Apply 1 application topically. Apply affected areas topically as needed for fungal rash to abdominal folds, breasts and groin 2 times a day.   Yes [provider]  omeprazole (PRILOSEC) 20 MG capsule Take 1 capsule by mouth daily. 09/10/13  Yes [provider]  polyethylene glycol (MIRALAX / GLYCOLAX) packet Take 17 g by mouth daily.   Yes [provider]  predniSONE (DELTASONE) 5 MG tablet Take 1 tablet (5 mg total) by mouth daily with breakfast. 01/11/15  Yes Young, Clinton D, MD  pregabalin (LYRICA) 25 MG capsule Take 25 mg by mouth 2 (two) times daily.   Yes [provider]  spironolactone (ALDACTONE) 25 MG tablet Take 25 mg by mouth daily.   Yes [provider]  traMADol (ULTRAM) 50 MG tablet Take 50 mg by mouth every 12 (twelve) hours. Reported on 02/05/2016   Yes [provider]  Zinc Oxide (DESITIN) 40 % PSTE Apply topically. Apply to gluteals three times a day for redness.   Yes [provider]    Physical Exam: Vitals:   03/29/18 1330 03/29/18 1400 03/29/18 1427 03/29/18 1442  BP: 111/73 117/74    Pulse: (!) 101 95    Resp: (!) 22 18    Temp:  (!) 100.9 F (38.3 C)  (!) 100.8 F (38.2 C)   TempSrc:   Rectal   SpO2: 95% 98%    Weight:    62.1 kg (136 lb 14.5 oz)  Height:     (1.575 m)      Constitutional: warm to touch, able to say a few simple words and answer yes or no inconsistently.  Eyes: PERTLA, lids and conjunctivae normal, no icterus, no nystagmus. ENMT: Mucous membranes were dry on exam. Posterior pharynx clear of any exudate or lesions. No thrush, poor dentition/missing teeth.  Neck: supple, no masses, no thyromegaly, no JVD Respiratory: fair air movement, no wheezing, no crackles, positive rhonchi, no using accessory muscles. Cardiovascular: mild tachycardia, no rubs, no gallops, no murmurs appreciated. No JVD.   Abdomen: No distension, no tenderness, no masses palpated. No hepatosplenomegaly. Bowel sounds normal.  Musculoskeletal: no joint swelling, no clubbing;  cold feet appreciated; patient with chronic ulcers in the tips of her toes (especially left big toe). Skin: multiple bruises, positive indurated ulcer in the tip of her toes (biggest one on top of her left big toe). Neurologic: oriented to person, MS 3/5 bilaterally and symmetrically; CN intact. Moving four limbs. Psychiatric: appropriate mood. Pleasantly confused and oriented X 1 only.   Labs on Admission: I have personally reviewed following labs and imaging studies  CBC: Recent Labs  Lab 03/29/18 1135  WBC 24.3*  NEUTROABS 22.4*  HGB 13.4  HCT 41.2  MCV 99.3  PLT 265   Basic Metabolic Panel: Recent Labs  Lab 03/29/18 1135  NA 134*  K 5.2*  CL 96*  CO2 26  GLUCOSE 210*  BUN 35*  CREATININE 1.18*  CALCIUM 8.7*   Liver Function Tests: Recent Labs  Lab 03/29/18 1135  AST 41  ALT 42  ALKPHOS 97  BILITOT 1.1  PROT 6.8  ALBUMIN 2.6*   Urine analysis:    Component Value Date/Time   COLORURINE YELLOW 03/29/2018 1135   APPEARANCEUR CLOUDY (A) 03/29/2018 1135   LABSPEC 1.014 03/29/2018 1135   PHURINE 5.0 03/29/2018 1135    GLUCOSEU NEGATIVE 03/29/2018 1135   HGBUR SMALL (A) 03/29/2018 1135   BILIRUBINUR NEGATIVE 03/29/2018 1135   KETONESUR NEGATIVE 03/29/2018 1135   PROTEINUR 30 (A) 03/29/2018 1135   NITRITE NEGATIVE 03/29/2018 1135   LEUKOCYTESUR MODERATE (A) 03/29/2018 1135    Radiological Exams on Admission: Ct Head Wo Contrast  Result Date: 03/29/2018 CLINICAL DATA:  Altered mental status since Thursday, urinary tract infection EXAM: CT HEAD WITHOUT CONTRAST TECHNIQUE: Contiguous axial images were obtained from the base of the skull through the vertex without intravenous contrast. The patient was a positioned obliquely on the gantry. COMPARISON:  03/20/2018 FINDINGS: Brain: Mild atrophy. No evidence of acute infarction, hemorrhage, hydrocephalus, extra-axial collection or mass lesion/mass effect. Vascular: Atherosclerotic and physiologic intracranial calcifications. Skull: Normal. Negative for fracture or focal lesion. Sinuses/Orbits: Fluid level in the sphenoid sinus. Otherwise negative. Other: None. IMPRESSION: 1. Mild atrophy, without bleed or other acute intracranial process. 2. Sphenoid sinus disease. Electronically Signed   By: Corlis Leak M.D.   On: 03/29/2018 13:00   Dg Chest Port 1 View  Result Date: 03/29/2018 CLINICAL DATA:  SEPSIS, PER ER NOTE, Pt from [place] of  for altered mental status since Thursday HISTORY OF CHF, DM, HTN, COPD EXAM: PORTABLE CHEST - 1 VIEW COMPARISON:  01/07/2018 FINDINGS: Worsening consolidation/atelectasis at the left lung base. Increased prominence of coarse right perihilar interstitial opacities. Heart size and mediastinal contours are within normal limits. Aortic Atherosclerosis (ICD10-170.0) Can't exclude small left pleural effusion.  No pneumothorax. Visualized bones unremarkable. IMPRESSION: 1. Worsening left lower lung atelectasis/consolidation possibly with effusion. Electronically Signed   By: Corlis Leak M.D.   On: 03/29/2018 13:03    EKG: Independently  reviewed. Positive atrial fibrillation, left axis deviation, normal QT.  Assessment/Plan 1-Sepsis due to gram-negative UTI (HCC): posiitve K. Pneumoniae with ESBL -admitted to med-surg -contact precaution activated  -started on meropenem, IVF's and PRN antipyretics  -will follow clinical response  -patient DNR and no looking for heroic measures; family in agreement and looking to treat what is treatable.   2-dehydration/lactici acidosis -in the setting of UTI and poor oral intake -provide gentle IVF's -follow response and lactic acid trend   3-toxic encephalopathy -in patient with underlying vascular dementia -provide supportive care -treat UTI -CT head w/o acute abnormalities   4-DM type 2  with diabetic peripheral neuropathy (HCC) -treated with diet as an outpatient  -will check A1C and use SSI -continue lyrica   5-HLD (hyperlipidemia) -not on statins -in agreement, base on age  67-COPD mixed type (HCC) -has had intermittent use of oxygen in the past -continue supplementation for now -continue duoneb, oxygen supplementation and chronic prednisone.  7-GERD -continue PPI  8-atrial fibrillation -chronic -overall stable and w/o RVR -will use metoprolol   9-chronic diastolic HF -will stop lasix and aldactone  -patient currently dry -follow daily weight and I's and O's -heart healthy diet ordered -holding diuretics currently   10-depression -continue Cymbalta    Chronic diastolic HF (heart failure) (HCC)   Atrial fibrillation, chronic (HCC)   Benign essential HTN   Vascular dementia    Time: 70 minutes   DVT prophylaxis: Heparin Code Status: DNR Family Communication: Son at bedside Disposition Plan: To be determined Consults: None Admission status: Inpatient, length of stay more than 2 midnights, MedSurg.   Vassie Loll MD [facility] Pager [phone]  If 7PM-7AM, please contact night-coverage [email or web address] Password [email or web address]  03/29/2018,  4:15 PM

## 2018-03-30 DIAGNOSIS — R7881 Bacteremia: Secondary | ICD-10-CM

## 2018-03-30 LAB — GLUCOSE, CAPILLARY
GLUCOSE-CAPILLARY: 141 mg/dL — AB (ref 65–99)
GLUCOSE-CAPILLARY: 181 mg/dL — AB (ref 65–99)
Glucose-Capillary: 142 mg/dL — ABNORMAL HIGH (ref 65–99)
Glucose-Capillary: 175 mg/dL — ABNORMAL HIGH (ref 65–99)

## 2018-03-30 LAB — BLOOD CULTURE ID PANEL (REFLEXED)
ACINETOBACTER BAUMANNII: NOT DETECTED
CANDIDA PARAPSILOSIS: NOT DETECTED
Candida albicans: NOT DETECTED
Candida glabrata: NOT DETECTED
Candida krusei: NOT DETECTED
Candida tropicalis: NOT DETECTED
Enterobacter cloacae complex: NOT DETECTED
Enterobacteriaceae species: NOT DETECTED
Enterococcus species: NOT DETECTED
Escherichia coli: NOT DETECTED
HAEMOPHILUS INFLUENZAE: NOT DETECTED
KLEBSIELLA OXYTOCA: NOT DETECTED
Klebsiella pneumoniae: NOT DETECTED
Listeria monocytogenes: NOT DETECTED
METHICILLIN RESISTANCE: DETECTED — AB
Neisseria meningitidis: NOT DETECTED
PSEUDOMONAS AERUGINOSA: NOT DETECTED
Proteus species: NOT DETECTED
SERRATIA MARCESCENS: NOT DETECTED
STAPHYLOCOCCUS AUREUS BCID: DETECTED — AB
STREPTOCOCCUS PNEUMONIAE: NOT DETECTED
Staphylococcus species: DETECTED — AB
Streptococcus agalactiae: NOT DETECTED
Streptococcus pyogenes: NOT DETECTED
Streptococcus species: NOT DETECTED

## 2018-03-30 LAB — CBC
HEMATOCRIT: 38.6 % (ref 36.0–46.0)
HEMOGLOBIN: 12.1 g/dL (ref 12.0–15.0)
MCH: 31.6 pg (ref 26.0–34.0)
MCHC: 31.3 g/dL (ref 30.0–36.0)
MCV: 100.8 fL — ABNORMAL HIGH (ref 78.0–100.0)
Platelets: 232 10*3/uL (ref 150–400)
RBC: 3.83 MIL/uL — AB (ref 3.87–5.11)
RDW: 13.9 % (ref 11.5–15.5)
WBC: 19.8 10*3/uL — AB (ref 4.0–10.5)

## 2018-03-30 LAB — BASIC METABOLIC PANEL
ANION GAP: 11 (ref 5–15)
BUN: 30 mg/dL — ABNORMAL HIGH (ref 6–20)
CHLORIDE: 105 mmol/L (ref 101–111)
CO2: 24 mmol/L (ref 22–32)
Calcium: 8 mg/dL — ABNORMAL LOW (ref 8.9–10.3)
Creatinine, Ser: 0.89 mg/dL (ref 0.44–1.00)
GFR calc Af Amer: >60 mL/min (ref >60)
GFR, EST NON AFRICAN AMERICAN: 54 mL/min — AB (ref >60)
Glucose, Bld: 151 mg/dL — ABNORMAL HIGH (ref 65–99)
POTASSIUM: 3.9 mmol/L (ref 3.5–5.1)
SODIUM: 140 mmol/L (ref 135–145)

## 2018-03-30 MED ORDER — FOSFOMYCIN TROMETHAMINE 3 G PO PACK
3.0000 g | PACK | Freq: Once | ORAL | Status: AC
  Administered 2018-03-30: 3 g via ORAL
  Filled 2018-03-30: qty 3

## 2018-03-30 MED ORDER — VANCOMYCIN HCL IN DEXTROSE 1-5 GM/200ML-% IV SOLN
1000.0000 mg | INTRAVENOUS | Status: DC
  Administered 2018-03-30 – 2018-04-02 (×4): 1000 mg via INTRAVENOUS
  Filled 2018-03-30 (×3): qty 200

## 2018-03-30 NOTE — Progress Notes (Signed)
[facility] PROGRESS NOTE  Carrie Heath ZOX:[id number] DOB: 09-23-1921 DOA: 03/29/2018 PCP: Benita Stabile (Inactive)  Interim discharge and HPI 84 y/o with PMH significant for HTN, vascular dementia, diastolic HF, HLD, chronic atrial fibrillation (not on anticoagulation), hx of recurrent UTI and type 2 diabetes with neuropathy; who was brought from SNF due to fever and AMS. Patient was found on 03/20/18 to have another episode of UTI (sensitivity demonstrated K. Pneumoniae ESBL); she was treated with keflex and then cipro (unfortunately, microorhanism was resistant to both antibiotics). Patient condition continue deteriorating, she continue spiking fever, not eating, no drinking much, increase weakness and became more confuse.  No CP, no SOB, no nausea, no vomiting, no new focal deficit, no hematuria, no melena, no hematochezia or any other complaints.   Assessment/Plan: 1-Sepsis due to Klebsiella pneumoniae ESBL UTI and MRSA bacteremia -Patient with 2 blood cultures positive for MRSA -Case has been discussed with infectious disease; recommendation has been to give 1 dose of fosfomycin, stop meropenem and, continue vancomycin and check 2D echo. -Repeat blood cultures -Continue supportive care and antipyretics -Patient continued to spike high-grade temperature. -no eating and very deconditioned   2-dehydration/lactic acidosis -will continue gentle IVF's  3-toxic encephalopathy -slightly improved -continue antibiotic therapy  4-type 2 DM with peripheral neuropathy -continue lyrica -continue SSI  5-HLD -not on statins -will no pursuit treatment given age; and per past associated muscle-ache   6-COPD mixed -continue chronic steroids, nebulizer treatment and PRN O2 supplementation -currently no wheezing   7-chronic diastolic heart failure -Currently compensated -will hold Lasix and Aldactone -Follow daily weights and strict intake and output.  8-GERD -continue  PPI  9-atrial fibrillation  -overall stable and w/o RVR -continue metoprolol  10-depression -continue cymbalta  Code Status: DNR Family Communication: Son at bedside Disposition Plan: Stop meropenem, continue vancomycin, give one dose of fosfomycin, check 2-D echo; continue gentle IVF's and follow clinical response.    Consultants:  ID curbside over the phone (in the presence of MRSA bacteremia)  Palliative Care  Procedures:  See below for x-ray reports   Antibiotics:  Meropenem 5/19>>>5/20  Vancomycin 5/19>>  Fosfomycin X 1 dose on 5/20  HPI/Subjective: Still spiking fever, no eating much, weak and deconditioned.   Objective: Vitals:   03/30/18 1459 03/30/18 1508  BP: (!) 144/83   Pulse: (!) 117   Resp: (!) 23   Temp:  (!) 102.9 F (39.4 C)  SpO2: 96%     Intake/Output Summary (Last 24 hours) at 03/30/2018 1556 Last data filed at 03/30/2018 1552 Gross per 24 hour  Intake 1864.25 ml  Output 800 ml  Net 1064.25 ml   Filed Weights   03/29/18 1114 03/29/18 1442 03/30/18 0609  Weight: 59 kg (130 lb) 62.1 kg (136 lb 14.5 oz) 62.2 kg (137 lb 2 oz)    Exam:   General: Spiking fever, no chest pain, no nausea vomiting.  Slightly more awake, but with very poor appetite and severe deconditioning.  Patient chronically ill in appearance.  Cardiovascular: S1 and S2, no rubs, no gallops, no JVD appreciated on exam.  Respiratory: No wheezing, no crackles, mild tachypnea.  Abdomen: soft, NT, ND, positive BS  Musculoskeletal: no edema, no clubbing, positive indurated ulcer in the tip of her toes.  Neurology: oriented to person, MS 3/5 bilaterally and symmetrically; CN intact.  Data Reviewed: Basic Metabolic Panel: Recent Labs  Lab 03/29/18 1135 03/29/18 1639 03/30/18 0449  NA 134*  --  140  K 5.2*  --  3.9  CL 96*  --  105  CO2 26  --  24  GLUCOSE 210*  --  151*  BUN 35*  --  30*  CREATININE 1.18*  --  0.89  CALCIUM 8.7*  --  8.0*  MG  --  1.7  --    PHOS  --  3.0  --    Liver Function Tests: Recent Labs  Lab 03/29/18 1135  AST 41  ALT 42  ALKPHOS 97  BILITOT 1.1  PROT 6.8  ALBUMIN 2.6*   CBC: Recent Labs  Lab 03/29/18 1135 03/30/18 0449  WBC 24.3* 19.8*  NEUTROABS 22.4*  --   HGB 13.4 12.1  HCT 41.2 38.6  MCV 99.3 100.8*  PLT 265 232   CBG: Recent Labs  Lab 03/29/18 1629 03/29/18 2203 03/30/18 0747 03/30/18 1145  GLUCAP 232* 228* 142* 141*    Recent Results (from the past 240 hour(s))  Blood Culture (routine x 2)     Status: None (Preliminary result)   Collection Time: 03/29/18 11:35 AM  Result Value Ref Range Status   Specimen Description RIGHT ANTECUBITAL  Final   Special Requests   Final    BOTTLES DRAWN AEROBIC AND ANAEROBIC Blood Culture adequate volume   Culture  Setup Time   Final    GRAM POSITIVE COCCI Gram Stain Report Called to,Read Back By and Verified With: BONDURANT, N. AT 0223 ON 03/30/2018 BY EVA AEROBIC BOTTLE ONLY Performed at [facility]    Culture   Final    NO GROWTH < 24 HOURS Performed at [facility], [address], [place], [place] [address]    Report Status PENDING  Incomplete  Blood Culture (routine x 2)     Status: None (Preliminary result)   Collection Time: 03/29/18 11:35 AM  Result Value Ref Range Status   Specimen Description   Final    BLOOD RIGHT HAND Performed at [facility], [address] [place], [place] [address]    Special Requests   Final    BOTTLES DRAWN AEROBIC AND ANAEROBIC Blood Culture adequate volume Performed at [facility], [address] [place], [place] [address]    Culture  Setup Time   Final    GRAM POSITIVE COCCI Gram Stain Report Called to,Read Back By and Verified With: BONDURANT,N. AT 0223 ON 03/30/2018 BY EVA IN BOTH AEROBIC AND ANAEROBIC BOTTLES Performed at [facility]    Culture [facility] POSITIVE COCCI  Final   Report Status PENDING  Incomplete  Blood Culture ID Panel (Reflexed)     Status: Abnormal   Collection Time:  03/29/18 11:35 AM  Result Value Ref Range Status   Enterococcus species NOT DETECTED NOT DETECTED Final   Listeria monocytogenes NOT DETECTED NOT DETECTED Final   Staphylococcus species DETECTED (A) NOT DETECTED Final    Comment: CRITICAL RESULT CALLED TO, READ BACK BY AND VERIFIED WITH: LORI POOLE PHARMD AT 9147 ON [id number] BY SJW    Staphylococcus aureus DETECTED (A) NOT DETECTED Final    Comment: Methicillin (oxacillin)-resistant Staphylococcus aureus (MRSA). MRSA is predictably resistant to beta-lactam antibiotics (except ceftaroline). Preferred therapy is vancomycin unless clinically contraindicated. Patient requires contact precautions if  hospitalized. CRITICAL RESULT CALLED TO, READ BACK BY AND VERIFIED WITH: LORI POOLE PHARMD AT 1308 ON [id number] BY SJW    Methicillin resistance DETECTED (A) NOT DETECTED Final    Comment: CRITICAL RESULT CALLED TO, READ BACK BY AND VERIFIED WITH: LORI POOLE PHARMD AT 9629 ON [id number] BY SJW  Streptococcus species NOT DETECTED NOT DETECTED Final   Streptococcus agalactiae NOT DETECTED NOT DETECTED Final   Streptococcus pneumoniae NOT DETECTED NOT DETECTED Final   Streptococcus pyogenes NOT DETECTED NOT DETECTED Final   Acinetobacter baumannii NOT DETECTED NOT DETECTED Final   Enterobacteriaceae species NOT DETECTED NOT DETECTED Final   Enterobacter cloacae complex NOT DETECTED NOT DETECTED Final   Escherichia coli NOT DETECTED NOT DETECTED Final   Klebsiella oxytoca NOT DETECTED NOT DETECTED Final   Klebsiella pneumoniae NOT DETECTED NOT DETECTED Final   Proteus species NOT DETECTED NOT DETECTED Final   Serratia marcescens NOT DETECTED NOT DETECTED Final   Haemophilus influenzae NOT DETECTED NOT DETECTED Final   Neisseria meningitidis NOT DETECTED NOT DETECTED Final   Pseudomonas aeruginosa NOT DETECTED NOT DETECTED Final   Candida albicans NOT DETECTED NOT DETECTED Final   Candida glabrata NOT DETECTED NOT DETECTED Final   Candida krusei NOT  DETECTED NOT DETECTED Final   Candida parapsilosis NOT DETECTED NOT DETECTED Final   Candida tropicalis NOT DETECTED NOT DETECTED Final    Comment: Performed at [facility], [address], [place], [place] [address]  MRSA PCR Screening     Status: Abnormal   Collection Time: 03/29/18  2:30 PM  Result Value Ref Range Status   MRSA by PCR POSITIVE (A) NEGATIVE Final    Comment:        The GeneXpert MRSA Assay (FDA approved for NASAL specimens only), is one component of a comprehensive MRSA colonization surveillance program. It is not intended to diagnose MRSA infection nor to guide or monitor treatment for MRSA infections. RESULT CALLED TO, READ BACK BY AND VERIFIED WITH: BONDURANT,R @ 2057 ON 03/29/18 BY JUW Performed at [facility], [address] [place], [place] [address]   Culture, blood (Routine X 2) w Reflex to ID Panel     Status: None (Preliminary result)   Collection Time: 03/30/18  1:25 PM  Result Value Ref Range Status   Specimen Description BLOOD LEFT ARM  Final   Special Requests   Final    BOTTLES DRAWN AEROBIC ONLY Blood Culture adequate volume Performed at [facility], [address] [place], [place] [address]    Culture PENDING  Incomplete   Report Status PENDING  Incomplete  Culture, blood (Routine X 2) w Reflex to ID Panel     Status: None (Preliminary result)   Collection Time: 03/30/18  1:26 PM  Result Value Ref Range Status   Specimen Description BLOOD RIGHT WRIST  Final   Special Requests   Final    BOTTLES DRAWN AEROBIC AND ANAEROBIC Blood Culture results may not be optimal due to an inadequate volume of blood received in culture bottles Performed at [facility], [address] [place], [place] [address]    Culture PENDING  Incomplete   Report Status PENDING  Incomplete     Studies: Ct Head Wo Contrast  Result Date: 03/29/2018 CLINICAL DATA:  Altered mental status since Thursday, urinary tract infection EXAM: CT HEAD WITHOUT CONTRAST  TECHNIQUE: Contiguous axial images were obtained from the base of the skull through the vertex without intravenous contrast. The patient was a positioned obliquely on the gantry. COMPARISON:  03/20/2018 FINDINGS: Brain: Mild atrophy. No evidence of acute infarction, hemorrhage, hydrocephalus, extra-axial collection or mass lesion/mass effect. Vascular: Atherosclerotic and physiologic intracranial calcifications. Skull: Normal. Negative for fracture or focal lesion. Sinuses/Orbits: Fluid level in the sphenoid sinus. Otherwise negative. Other: None. IMPRESSION: 1. Mild atrophy, without bleed or  other acute intracranial process. 2. Sphenoid sinus disease. Electronically Signed   By: Corlis Leak M.D.   On: 03/29/2018 13:00   Dg Chest Port 1 View  Result Date: 03/29/2018 CLINICAL DATA:  SEPSIS, PER ER NOTE, Pt from [facility] for altered mental status since Thursday HISTORY OF CHF, DM, HTN, COPD EXAM: PORTABLE CHEST - 1 VIEW COMPARISON:  01/07/2018 FINDINGS: Worsening consolidation/atelectasis at the left lung base. Increased prominence of coarse right perihilar interstitial opacities. Heart size and mediastinal contours are within normal limits. Aortic Atherosclerosis (ICD10-170.0) Can't exclude small left pleural effusion.  No pneumothorax. Visualized bones unremarkable. IMPRESSION: 1. Worsening left lower lung atelectasis/consolidation possibly with effusion. Electronically Signed   By: Corlis Leak M.D.   On: 03/29/2018 13:03    Scheduled Meds: . docusate sodium  100 mg Oral QODAY  . DULoxetine  60 mg Oral QHS  . feeding supplement (GLUCERNA SHAKE)  237 mL Oral BID BM  . feeding supplement (GLUCERNA SHAKE)  237 mL Oral TID BM  . heparin  5,000 Units Subcutaneous Q8H  . hydrocortisone  1 application Rectal BID  . insulin aspart  0-9 Units Subcutaneous TID WC  . mouth rinse  15 mL Mouth Rinse BID  . metoprolol tartrate  25 mg Oral BID  . pantoprazole  40 mg Oral Daily  . polyethylene glycol   17 g Oral Daily  . predniSONE  5 mg Oral Q breakfast  . pregabalin  25 mg Oral BID  . sodium chloride flush  3 mL Intravenous Q12H   Continuous Infusions: . sodium chloride 75 mL/hr at 03/30/18 0759  . sodium chloride    . vancomycin Stopped (03/30/18 1351)    Principal Problem:   Sepsis due to gram-negative UTI (HCC) Active Problems:   DM type 2 with diabetic peripheral neuropathy (HCC)   HLD (hyperlipidemia)   COPD mixed type (HCC)   Chronic diastolic HF (heart failure) (HCC)   Atrial fibrillation, chronic (HCC)   Benign essential HTN   Vascular dementia   Time spent: 35 minutes (> 50% of the time dedicated to face to face examination, bedside update and discussion with family member and also coordination of care and discussion with infectious disease provider).   Vassie Loll  [facility] Pager [phone]. If 7PM-7AM, please contact night-coverage at [email or web address], password [facility] 03/30/2018, 3:56 PM  LOS: 1 day

## 2018-03-30 NOTE — Progress Notes (Signed)
Initial Nutrition Assessment  DOCUMENTATION CODES:      INTERVENTION:  Downgrade diet to pureed (pt doesn't have her dentures)  Glucerna ad lib  (prefers strawberry)    NUTRITION DIAGNOSIS:   Inadequate oral intake related to poor appetite, acute illness(Sepsis (UTI)) as evidenced by per patient/family report(0-5% intake today).   GOAL:   Patient will meet greater than or equal to 90% of their needs   MONITOR:   PO intake, Supplement acceptance, Labs, Weight trends  REASON FOR ASSESSMENT:   Malnutrition Screening Tool, Low Braden    ASSESSMENT:  Patient is a 84 yo female with hx of dementia, DM, CHF, COPD. Presents from [facility] with weakness, increased confusion and septic ( UTI) dehydrated and febrile.  Her son assisted with nutrition hx: pt usually misses breakfast but drinks Glucerna. She has not eaten at today and per history her intake has been poor for > week.     Patient weight hx ranges between 130-140 lb.    Labs: BMP Latest Ref Rng & Units 03/30/2018 03/29/2018 03/28/2016  Glucose 65 - 99 mg/dL 409(W) 119(J) 478(G)  BUN 6 - 20 mg/dL 95(A) 21(H) 08(M)  Creatinine 0.44 - 1.00 mg/dL 5.78 4.69(G) 2.95(M)  Sodium 135 - 145 mmol/L 140 134(L) 133(L)  Potassium 3.5 - 5.1 mmol/L 3.9 5.2(H) 3.6  Chloride 101 - 111 mmol/L 105 96(L) 94(L)  CO2 22 - 32 mmol/L Calcium 8.9 - 10.3 mg/dL 8.0(L) 8.7(L) 9.0    Meds: . docusate sodium  100 mg Oral QODAY  . DULoxetine  60 mg Oral QHS  . feeding supplement (GLUCERNA SHAKE)  237 mL Oral BID BM  . feeding supplement (GLUCERNA SHAKE)  237 mL Oral TID BM  . heparin  5,000 Units Subcutaneous Q8H  . hydrocortisone  1 application Rectal BID  . insulin aspart  0-9 Units Subcutaneous TID WC  . mouth rinse  15 mL Mouth Rinse BID  . metoprolol tartrate  25 mg Oral BID  . pantoprazole  40 mg Oral Daily  . polyethylene glycol  17 g Oral Daily  . predniSONE  5 mg Oral Q breakfast  . pregabalin  25 mg Oral  BID  . sodium chloride flush  3 mL Intravenous Q12H   Past Medical History:  Diagnosis Date  . Bronchitis   . Chronic a-fib (HCC)   . Chronic atrial fibrillation (HCC)   . COPD (chronic obstructive pulmonary disease) (HCC)   . Diabetes mellitus without complication (HCC)   . Diastolic congestive heart failure (HCC)   . High cholesterol   . HTN (hypertension)    pt denies  . Hyperlipidemia   . Neuropathy      NUTRITION - FOCUSED PHYSICAL EXAM: Unable to complete today  Diet Order:   Diet Order           DIET DYS 3 Room service appropriate? Yes; Fluid consistency: Thin  Diet effective now          EDUCATION NEEDS:  No education needs have been identified at this time   Skin:  Skin Assessment: Reviewed RN Assessment  Last BM:  5/19  Height:   Ht Readings from Last 1 Encounters:  03/29/18  (1.575 m)    Weight:   Wt Readings from Last 1 Encounters:  03/30/18 137 lb 2 oz (62.2 kg)    Ideal Body Weight:  50 kg  BMI:  Body mass index is 25.08 kg/m.  Estimated Nutritional Needs:  Kcal:  1300-1400 (msj x AF 1.1-1.2 )  Protein:  65-70 gr (1.05-1.12 gr/kg/bw)  Fluid:  1.3-1.4 liters daily   Royann Shivers MS,RD,CSG,LDN Office: [phone] Pager: [phone]

## 2018-03-30 NOTE — Progress Notes (Signed)
Pt's rectal temp 102.5, Tylenol 650 mg administered. MD E-Paged.

## 2018-03-30 NOTE — Progress Notes (Signed)
Pharmacy Antibiotic Note  Carrie Heath is a 84 y.o. female admitted on 03/29/2018 with bacteremia and sepsis.  Pharmacy has been consulted for Vancomycin dosing.  Patient is also on meropenem for K. Pneumoniae with ESBL UTI.  Plan: Vancomycin 1000 mg IV every 24 hours.  Goal trough 15-20 mcg/mL.  Meropenem 1000 mg IV every 8 hours Monitor labs, c/s, and vanco trough as needed  Height:  (157.5 cm) Weight: 137 lb 2 oz (62.2 kg) IBW/kg (Calculated) : 50.1  Temp (24hrs), Avg:101.3 F (38.5 C), Min:97.7 F (36.5 C), Max:103.8 F (39.9 C)  Recent Labs  Lab 03/29/18 1135 03/29/18 1335 03/30/18 0449  WBC 24.3*  --  19.8*  CREATININE 1.18*  --  0.89  LATICACIDVEN 2.1* 2.1*  --     Estimated Creatinine Clearance: 33.5 mL/min (by C-G formula based on SCr of 0.89 mg/dL).    No Known Allergies  Antimicrobials this admission: Vanco 5/19 >>  Meropenem 5/19 >>   Dose adjustments this admission: N/A  Microbiology results: 5/19 BCx: gram + cocci 3/4 5/10 UCx: K. Pneumoniae ESBL 5/19 UCx: pending   5/19 MRSA PCR: positive  Thank you for allowing pharmacy to be a part of this patient's care.  Tad Moore 03/30/2018 10:17 AM

## 2018-03-30 NOTE — Progress Notes (Signed)
Pt's rectal temp 102.9, Tylenol 650 mg administered and MD E-Paged.

## 2018-03-30 NOTE — Progress Notes (Signed)
Per ED in [place], Blood cultures returned 2 aerobic bottles positive for gram positive cocci and 1 anaerobic bottle positive for gram positive cocci.  Dr. Antionette Char notified via text message.

## 2018-03-30 NOTE — Progress Notes (Signed)
       INFECTIOUS DISEASE ATTENDING ADDENDUM:   Date: 03/30/2018  Patient name: Carrie Heath  Medical record number: [id number]  Date of birth: Oct 06, 1921      Acey Lav 03/30/2018, 3:49 PM        [facility]imicrobial Management Team Staphylococcus aureus bacteremia   Staphylococcus aureus bacteremia (SAB) is associated with a high rate of complications and mortality.  Specific aspects of clinical management are critical to optimizing the outcome of patients with SAB.  Therefore, the [facility] Antimicrobial Management Team [facility]) has initiated an intervention aimed at improving the management of SAB at [facility].  To do so, Infectious Diseases physicians are providing an evidence-based consult for the management of all patients with SAB.     Yes No Comments  Perform follow-up blood cultures (even if the patient is afebrile) to ensure clearance of bacteremia     Remove vascular catheter and obtain follow-up blood cultures after the removal of the catheter   DO NOT PLACE PICC OR CENTRAL LINE UNTIL WE HAVE CLEARED BACTEREMIA  Perform echocardiography to evaluate for endocarditis (transthoracic ECHO is 40-50% sensitive, TEE is > 90% sensitive)   Please keep in mind, that neither test can definitively EXCLUDE endocarditis, and that should clinical suspicion remain high for endocarditis the patient should then still be treated with an "endocarditis" duration of therapy = 6 weeks  Consult electrophysiologist to evaluate implanted cardiac device (pacemaker, ICD)     Ensure source control   Have all abscesses been drained effectively? Have deep seeded infections (septic joints or osteomyelitis) had appropriate surgical debridement?  Investigate for "metastatic" sites of infection   Does the patient have ANY symptom or physical exam finding that would suggest a deeper infection (back or neck pain that may be suggestive of vertebral osteomyelitis  or epidural abscess, muscle pain that could be a symptom of pyomyositis)?  Keep in mind that for deep seeded infections MRI imaging with contrast is preferred rather than other often insensitive tests such as plain x-rays, especially early in a patient's presentation.  Change antibiotic therapy to vancomycyin and DC carbapenem   Beta-lactam antibiotics are preferred for MSSA due to higher cure rates.   If on Vancomycin, goal trough should be 15 - 20 mcg/mL  Estimated duration of IV antibiotic therapy:  4-6 weeks   Consult case management for probably prolonged outpatient IV antibiotic therapy    ? Gram negative UTI: She does have pyuria and a history of multidrug-resistant organisms in the urine is not clear to me that her symptoms have anything to do with her urine I would think that her MRSA bacteremia is more than enough to have caused her symptoms and likely respond reason the was not responding to antimicrobial therapy for various organisms in the urine.  I agree with discontinuing the meropenem 1 dose of fosfomycin is not unreasonable.  Goals of care: Morbidity mortality of staph aureus bacteremia is very high at 25% for all comers and certainly likely higher for this individual I would encourage further goals of care  Discussed with Dr. Gwenlyn Perking.

## 2018-03-30 NOTE — Progress Notes (Signed)
Pt's Rectal temp 102.6. Ibuprofen 600 mg administered. Oncoming RN informed and will continue to monitor patient.

## 2018-03-31 ENCOUNTER — Encounter (HOSPITAL_COMMUNITY): Payer: Self-pay | Admitting: Primary Care

## 2018-03-31 DIAGNOSIS — N390 Urinary tract infection, site not specified: Secondary | ICD-10-CM

## 2018-03-31 DIAGNOSIS — F0150 Vascular dementia without behavioral disturbance: Secondary | ICD-10-CM

## 2018-03-31 DIAGNOSIS — Z7189 Other specified counseling: Secondary | ICD-10-CM

## 2018-03-31 DIAGNOSIS — Z515 Encounter for palliative care: Secondary | ICD-10-CM

## 2018-03-31 DIAGNOSIS — A4150 Gram-negative sepsis, unspecified: Secondary | ICD-10-CM

## 2018-03-31 LAB — GLUCOSE, CAPILLARY
GLUCOSE-CAPILLARY: 139 mg/dL — AB (ref 65–99)
Glucose-Capillary: 140 mg/dL — ABNORMAL HIGH (ref 65–99)
Glucose-Capillary: 226 mg/dL — ABNORMAL HIGH (ref 65–99)
Glucose-Capillary: 186 mg/dL — ABNORMAL HIGH (ref 65–99)

## 2018-03-31 MED ORDER — CHLORHEXIDINE GLUCONATE CLOTH 2 % EX PADS
6.0000 | MEDICATED_PAD | Freq: Every day | CUTANEOUS | Status: DC
  Administered 2018-04-01 – 2018-04-02 (×2): 6 via TOPICAL

## 2018-03-31 MED ORDER — MUPIROCIN 2 % EX OINT
1.0000 "application " | TOPICAL_OINTMENT | Freq: Two times a day (BID) | CUTANEOUS | Status: DC
  Administered 2018-03-31 – 2018-04-02 (×5): 1 via NASAL
  Filled 2018-03-31: qty 22

## 2018-03-31 MED ORDER — METOPROLOL TARTRATE 25 MG PO TABS
12.5000 mg | ORAL_TABLET | Freq: Two times a day (BID) | ORAL | Status: DC
  Administered 2018-03-31 – 2018-04-01 (×2): 12.5 mg via ORAL
  Filled 2018-03-31 (×4): qty 1

## 2018-03-31 MED ORDER — KETOROLAC TROMETHAMINE 15 MG/ML IJ SOLN
15.0000 mg | Freq: Once | INTRAMUSCULAR | Status: AC
  Administered 2018-03-31: 15 mg via INTRAVENOUS
  Filled 2018-03-31: qty 1

## 2018-03-31 MED ORDER — LACTATED RINGERS IV BOLUS
500.0000 mL | Freq: Once | INTRAVENOUS | Status: AC
  Administered 2018-03-31: 500 mL via INTRAVENOUS

## 2018-03-31 NOTE — Progress Notes (Signed)
[facility] PROGRESS NOTE  Carrie Heath ZOX:[id number] DOB: 08-Sep-1921 DOA: 03/29/2018 PCP: Benita Stabile (Inactive)  Interim discharge and HPI 84 y/o with PMH significant for HTN, vascular dementia, diastolic HF, HLD, chronic atrial fibrillation (not on anticoagulation), hx of recurrent UTI and type 2 diabetes with neuropathy; who was brought from SNF due to fever and AMS. Patient was found on 03/20/18 to have another episode of UTI (sensitivity demonstrated K. Pneumoniae ESBL); she was treated with keflex and then cipro (unfortunately, microorhanism was resistant to both antibiotics). Patient condition continue deteriorating, she continue spiking fever, not eating, no drinking much, increase weakness and became more confuse.  No CP, no SOB, no nausea, no vomiting, no new focal deficit, no hematuria, no melena, no hematochezia or any other complaints.   Assessment/Plan: 1-Sepsis due to Klebsiella pneumoniae ESBL UTI and MRSA bacteremia -Patient with 2 blood cultures positive for MRSA; urine culture also reported K. Pneumoniae (? Colonization) -Case has been discussed with infectious disease; will continue IV vancomycin and follow echo. S/p discontinuing meropenem and given one dose of fosfomycin. -follow repeated blood cultures as ordered by ID -Patient continued to spike high-grade temperature intermittently and ended requiring cooling blankets to help with fever control. -continue PRN antipyretics. -continue to have poor oral intake. -patient is very ill and her prognosis is guarded; palliative care on board.  2-dehydration/lactic acidosis -will continue gentle IVF's -will be judicious with IVF's as patient had history of CHF  3-toxic encephalopathy -more alert, but still not at baseline  -continue antibiotic therapy -follow clinical response   4-type 2 DM with peripheral neuropathy -will continue Lyrica  -continue SSI  5-HLD -not on statins -will no pursuit treatment  given age; and per past associated muscle-ache   6-COPD mixed -continue chronic steroids, home nebulizer treatment and PRN O2 supplementation -good air movement bilaterally   7-chronic diastolic heart failure -compensated for now -continue holding lasix and aldactone given soft BP and poor oral intake -Follow daily weights and strict intake and output.  8-GERD -continue PPI  9-atrial fibrillation  -stable and rate controlled -continue low dose metoprolol with holding parameters  10-depression -will continue cymbalta  Code Status: DNR Family Communication: Son at bedside Disposition Plan: Continue IV vancomycin, S/P one dose of fosfomycin on 5/20, follow 2-D echo; continue gentle IVF's, continue PRN antipyretics. and follow clinical response.    Consultants:  ID curbside over the phone (in the presence of MRSA bacteremia)  Palliative Care  Procedures:  See below for x-ray reports   Antibiotics:  Meropenem 5/19>>>5/20  Vancomycin 5/19>>  Fosfomycin X 1 dose on 5/20  HPI/Subjective: Still spiking fever, poor oral intake, weak, deconditioned and very frail.  Objective: Vitals:   03/31/18 0641 03/31/18 1038  BP: 102/76 (!) 96/43  Pulse: 79 74  Resp: (!) 22   Temp: (!) 97.4 F (36.3 C)   SpO2: 98% 95%    Intake/Output Summary (Last 24 hours) at 03/31/2018 1405 Last data filed at 03/31/2018 0900 Gross per 24 hour  Intake 2430 ml  Output 1025 ml  Net 1405 ml   Filed Weights   03/29/18 1114 03/29/18 1442 03/30/18 0609  Weight: 59 kg (130 lb) 62.1 kg (136 lb 14.5 oz) 62.2 kg (137 lb 2 oz)    Exam:   General: Patient is still spiking high-grade fever intermittently; even this morning she had some improvement in the fever core.  She has in the requiring the use of cooling blankets.  Denies chest pain and shortness  of breath.  No nausea, no vomiting, no abdominal pain, no diarrhea.  Patient is frail, chronically ill in appearance and currently with poor oral  intake.    Cardiovascular: S1 and S2, no rubs, no gallops, no JVD.  Respiratory: Scattered rhonchi, no wheezing, no frank crackles.  Patient no using accessory muscles.  Wearing 2-3 L nasal cannula oxygen supplementation.  Abdomen: Soft, nontender, nondistended, positive bowel sounds  Musculoskeletal: No edema, no clubbing.  Patient with indurated ulcer in the tip of her toes suggesting underlying PAD.  Neurology: Slightly more alert, oriented to person, answering questions yes/no intermittently, muscle strength 3/5 bilaterally and symmetrically secondary to poor effort and severe deconditioning.  Data Reviewed: Basic Metabolic Panel: Recent Labs  Lab 03/29/18 1135 03/29/18 1639 03/30/18 0449  NA 134*  --  140  K 5.2*  --  3.9  CL 96*  --  105  CO2 26  --  24  GLUCOSE 210*  --  151*  BUN 35*  --  30*  CREATININE 1.18*  --  0.89  CALCIUM 8.7*  --  8.0*  MG  --  1.7  --   PHOS  --  3.0  --    Liver Function Tests: Recent Labs  Lab 03/29/18 1135  AST 41  ALT 42  ALKPHOS 97  BILITOT 1.1  PROT 6.8  ALBUMIN 2.6*   CBC: Recent Labs  Lab 03/29/18 1135 03/30/18 0449  WBC 24.3* 19.8*  NEUTROABS 22.4*  --   HGB 13.4 12.1  HCT 41.2 38.6  MCV 99.3 100.8*  PLT 265 232   CBG: Recent Labs  Lab 03/30/18 1145 03/30/18 1643 03/30/18 2205 03/31/18 0807 03/31/18 1145  GLUCAP 141* 175* 181* 140* 139*    Recent Results (from the past 240 hour(s))  Blood Culture (routine x 2)     Status: Abnormal (Preliminary result)   Collection Time: 03/29/18 11:35 AM  Result Value Ref Range Status   Specimen Description   Final    RIGHT ANTECUBITAL Performed at [facility], [address] [place], [place] [address]    Special Requests   Final    BOTTLES DRAWN AEROBIC AND ANAEROBIC Blood Culture adequate volume Performed at [facility], [address], [place], [place] [address]    Culture  Setup Time   Final    GRAM POSITIVE COCCI Gram Stain Report Called to,Read Back By and  Verified With: BONDURANT, N. AT 0223 ON 03/30/2018 BY EVA AEROBIC BOTTLE ONLY Performed at [facility] Performed at [facility], [address], [place], [place] [address]    Culture STAPHYLOCOCCUS AUREUS (A)  Final   Report Status PENDING  Incomplete  Blood Culture (routine x 2)     Status: Abnormal (Preliminary result)   Collection Time: 03/29/18 11:35 AM  Result Value Ref Range Status   Specimen Description   Final    BLOOD RIGHT HAND Performed at [facility], [address] [place], [place] [address]    Special Requests   Final    BOTTLES DRAWN AEROBIC AND ANAEROBIC Blood Culture adequate volume Performed at [facility], [address], [place], [place] [address]    Culture  Setup Time   Final    GRAM POSITIVE COCCI Gram Stain Report Called to,Read Back By and Verified With: BONDURANT,N. AT 0223 ON 03/30/2018 BY EVA IN BOTH AEROBIC AND ANAEROBIC BOTTLES Performed at [facility]    Culture STAPHYLOCOCCUS AUREUS (A)  Final   Report Status PENDING  Incomplete  Urine culture     Status: Abnormal (Preliminary result)   Collection Time: 03/29/18 11:35 AM  Result Value Ref Range Status   Specimen Description   Final    URINE, CLEAN CATCH Performed at [facility], [address], [place], [place] [address]    Special Requests   Final    NONE Performed at [facility], [address], [place], [place] [address]    Culture >=100,000 COLONIES/mL KLEBSIELLA PNEUMONIAE (A)  Final   Report Status PENDING  Incomplete  Blood Culture ID Panel (Reflexed)     Status: Abnormal   Collection Time: 03/29/18 11:35 AM  Result Value Ref Range Status   Enterococcus species NOT DETECTED NOT DETECTED Final   Listeria monocytogenes NOT DETECTED NOT DETECTED Final   Staphylococcus species DETECTED (A) NOT DETECTED Final    Comment: CRITICAL RESULT CALLED TO, READ BACK BY AND VERIFIED WITH: LORI POOLE PHARMD AT 0907 ON [id number] BY SJW    Staphylococcus aureus DETECTED (A) NOT DETECTED  Final    Comment: Methicillin (oxacillin)-resistant Staphylococcus aureus (MRSA). MRSA is predictably resistant to beta-lactam antibiotics (except ceftaroline). Preferred therapy is vancomycin unless clinically contraindicated. Patient requires contact precautions if  hospitalized. CRITICAL RESULT CALLED TO, READ BACK BY AND VERIFIED WITH: LORI POOLE PHARMD AT 1308 ON [id number] BY SJW    Methicillin resistance DETECTED (A) NOT DETECTED Final    Comment: CRITICAL RESULT CALLED TO, READ BACK BY AND VERIFIED WITH: LORI POOLE PHARMD AT 0907 ON [id number] BY SJW    Streptococcus species NOT DETECTED NOT DETECTED Final   Streptococcus agalactiae NOT DETECTED NOT DETECTED Final   Streptococcus pneumoniae NOT DETECTED NOT DETECTED Final   Streptococcus pyogenes NOT DETECTED NOT DETECTED Final   Acinetobacter baumannii NOT DETECTED NOT DETECTED Final   Enterobacteriaceae species NOT DETECTED NOT DETECTED Final   Enterobacter cloacae complex NOT DETECTED NOT DETECTED Final   Escherichia coli NOT DETECTED NOT DETECTED Final   Klebsiella oxytoca NOT DETECTED NOT DETECTED Final   Klebsiella pneumoniae NOT DETECTED NOT DETECTED Final   Proteus species NOT DETECTED NOT DETECTED Final   Serratia marcescens NOT DETECTED NOT DETECTED Final   Haemophilus influenzae NOT DETECTED NOT DETECTED Final   Neisseria meningitidis NOT DETECTED NOT DETECTED Final   Pseudomonas aeruginosa NOT DETECTED NOT DETECTED Final   Candida albicans NOT DETECTED NOT DETECTED Final   Candida glabrata NOT DETECTED NOT DETECTED Final   Candida krusei NOT DETECTED NOT DETECTED Final   Candida parapsilosis NOT DETECTED NOT DETECTED Final   Candida tropicalis NOT DETECTED NOT DETECTED Final    Comment: Performed at [facility], [address] [place], [place] [address]  MRSA PCR Screening     Status: Abnormal   Collection Time: 03/29/18  2:30 PM  Result Value Ref Range Status   MRSA by PCR POSITIVE (A) NEGATIVE Final     Comment:        The GeneXpert MRSA Assay (FDA approved for NASAL specimens only), is one component of a comprehensive MRSA colonization surveillance program. It is not intended to diagnose MRSA infection nor to guide or monitor treatment for MRSA infections. RESULT CALLED TO, READ BACK BY AND VERIFIED WITH: BONDURANT,R @ 2057 ON 03/29/18 BY JUW Performed at [facility], [address] [place], [place] [address]   Culture, blood (Routine X 2) w Reflex to ID Panel     Status: None (Preliminary result)   Collection Time: 03/30/18  1:25 PM  Result Value Ref Range Status  Specimen Description BLOOD LEFT ARM  Final   Special Requests   Final    BOTTLES DRAWN AEROBIC ONLY Blood Culture adequate volume   Culture   Final    NO GROWTH < 24 HOURS Performed at [facility], [address], [place], [place] [address]    Report Status PENDING  Incomplete  Culture, blood (Routine X 2) w Reflex to ID Panel     Status: None (Preliminary result)   Collection Time: 03/30/18  1:26 PM  Result Value Ref Range Status   Specimen Description BLOOD RIGHT WRIST  Final   Special Requests   Final    BOTTLES DRAWN AEROBIC AND ANAEROBIC Blood Culture results may not be optimal due to an inadequate volume of blood received in culture bottles   Culture   Final    NO GROWTH < 24 HOURS Performed at [facility], [address] [place], [place] [address]    Report Status PENDING  Incomplete     Studies: No results found.  Scheduled Meds: . docusate sodium  100 mg Oral QODAY  . DULoxetine  60 mg Oral QHS  . feeding supplement (GLUCERNA SHAKE)  237 mL Oral BID BM  . feeding supplement (GLUCERNA SHAKE)  237 mL Oral TID BM  . heparin  5,000 Units Subcutaneous Q8H  . hydrocortisone  1 application Rectal BID  . insulin aspart  0-9 Units Subcutaneous TID WC  . mouth rinse  15 mL Mouth Rinse BID  . metoprolol tartrate  25 mg Oral BID  . pantoprazole  40 mg Oral Daily  . polyethylene glycol  17 g Oral Daily   . predniSONE  5 mg Oral Q breakfast  . pregabalin  25 mg Oral BID  . sodium chloride flush  3 mL Intravenous Q12H   Continuous Infusions: . sodium chloride 75 mL/hr at 03/31/18 0422  . sodium chloride    . vancomycin 1,000 mg (03/31/18 1318)    Principal Problem:   Sepsis due to gram-negative UTI (HCC) Active Problems:   DM type 2 with diabetic peripheral neuropathy (HCC)   HLD (hyperlipidemia)   COPD mixed type (HCC)   Chronic diastolic HF (heart failure) (HCC)   Atrial fibrillation, chronic (HCC)   Benign essential HTN   Vascular dementia   Palliative care by specialist   Goals of care, counseling/discussion   Encounter for hospice care discussion   Time spent: 35 minutes (> 50% of the time dedicated to discussed with family at bedside, about high mortality with current infection, discussing involved treatment; talking with ID and palliative care specialist and coordinating her care)   Vassie Loll  [facility] Pager [phone]. If 7PM-7AM, please contact night-coverage at [email or web address], password [facility] 03/31/2018, 2:05 PM  LOS: 2 days

## 2018-03-31 NOTE — Progress Notes (Signed)
       INFECTIOUS DISEASE ATTENDING ADDENDUM:   Date: 03/31/2018  Patient name: Carrie Heath  Medical record number: [id number]  Date of birth: 01-03-1921      Acey Lav 03/31/2018, 10:01 AM        [place] [facility] Staphylococcus aureus bacteremia   Staphylococcus aureus bacteremia (SAB) is associated with a high rate of complications and mortality.  Specific aspects of clinical management are critical to optimizing the outcome of patients with SAB.  Therefore, the [facility] Antimicrobial Management Team [facility]) has initiated an intervention aimed at improving the management of SAB at [facility].  To do so, Infectious Diseases physicians are providing an evidence-based consult for the management of all patients with SAB.     Yes No Comments  Perform follow-up blood cultures (even if the patient is afebrile) to ensure clearance of bacteremia   NO GROWTH <24 hours but would not be surprised if still +  Remove vascular catheter and obtain follow-up blood cultures after the removal of the catheter   DO NOT PLACE PICC OR CENTRAL LINE UNTIL WE HAVE CLEARED BACTEREMIA  Perform echocardiography to evaluate for endocarditis (transthoracic ECHO is 40-50% sensitive, TEE is > 90% sensitive)   Please keep in mind, that neither test can definitively EXCLUDE endocarditis, and that should clinical suspicion remain high for endocarditis the patient should then still be treated with an "endocarditis" duration of therapy = 6 weeks  Consult electrophysiologist to evaluate implanted cardiac device (pacemaker, ICD)     Ensure source control   Have all abscesses been drained effectively? Have deep seeded infections (septic joints or osteomyelitis) had appropriate surgical debridement?  Investigate for "metastatic" sites of infection   Does the patient have ANY symptom or physical exam finding that would suggest a deeper infection (back or  neck pain that may be suggestive of vertebral osteomyelitis or epidural abscess, muscle pain that could be a symptom of pyomyositis)?  Keep in mind that for deep seeded infections MRI imaging with contrast is preferred rather than other often insensitive tests such as plain x-rays, especially early in a patient's presentation.  Change antibiotic therapy to vancomycyin and DC carbapenem   Beta-lactam antibiotics are preferred for MSSA due to higher cure rates.   If on Vancomycin, goal trough should be 15 - 20 mcg/mL  Estimated duration of IV antibiotic therapy:  4-6 weeks   Consult case management for probably prolonged outpatient IV antibiotic therapy    ? Gram negative UTI: we changed to fostomycin to avoid unnecessary antibiotics and my skepticism that she has a UTI playing a sig role here when she has MRSA coursing through her bloodstream  Goals of care: Morbidity mortality of staph aureus bacteremia is very high at 25% for all comers and certainly likely higher for this individual I would encourage further goals of care  Will discuss with Dr. Gwenlyn Perking.

## 2018-03-31 NOTE — Consult Note (Signed)
Consultation Note Date: 03/31/2018   Patient Name: Carrie Heath  DOB: 02/24/1921  MRN: [id number]  Age / Sex: 84 y.o., female  PCP: Celene Squibb (Inactive) Referring Physician: Barton Dubois, MD  Reason for Consultation: Establishing goals of care  HPI/Patient Profile: 84 y.o. female  with past medical history of vascular dementia 3 to 4 months, heart failure approximately 2 years chronic A. fib for 4 years not on anticoagulation, high blood pressure and cholesterol, diabetes with severe neuropathy in feet, history of 4-5 UTIs in the last year, 2 in the last 4 months, resident of ALF 3+ years admitted on 03/29/2018 with sepsis due to gram-negative UTI, Staphylococcus aureus bacteremia.   Clinical Assessment and Goals of Care: Carrie Heath is lying quietly in bed.  She will briefly open her eyes and make, but not keep, eye contact.  Present today at bedside is son Dominica Severin.  We go to my office for a family meeting.  We call Carrie Heath's son Shanon Brow who lives in [place] on the phone for conference.  I have reviewed medical records including EPIC notes, labs and imaging, received report from hospitalist, Dr. Dyann Kief, assessed the patient and then met at the bedside along with son Dominica Severin and son Shanon Brow via phone to discuss diagnosis prognosis, [place], EOL wishes, disposition and options.  I introduced Palliative Medicine as specialized medical care for people living with serious illness. It focuses on providing relief from the symptoms and stress of a serious illness. The goal is to improve quality of life for both the patient and the family.  We discussed a brief life review of the patient.  Her husband died approximately 69 years ago.  Sons are Dominica Severin [facility]) and Shanon Brow, wife Silva Bandy ([place]).  Carrie Heath also has a sister named [place].  As far as functional and nutritional status Carrie Heath has  had 2 person assist for transfers for at least one year.  She has been living in the ALF for approximately 3+ years.  Family endorses that Carrie Heath has lost weight, Dominica Severin states she has been eating and drinking.  Shanon Brow has paperwork from ALF April 4 showing a weight of 145 pounds.  He states that Carrie Heath has had a hard time chewing and eating with dentures and bone spurs on her jaw.  She was on a special diet with ground meats.     We discussed their current illness and what it means in the larger context of their on-going co-morbidities.  Natural disease trajectory and expectations at EOL were discussed.  I share a diagram of the chronic illness pathway, what is normal and expected.  We talked about multiple "what if" scenarios.  We talked about Carrie Heath's ability to return to ALF versus needing a higher level care SNF.  I attempted to elicit values and goals of care important to the patient.  I ask about "a good day" for Carrie Heath.  Family shares that a good day for her involves getting in her wheelchair, going  to lunch, possibly some activity, having transfers back and forth to the toilet and her bed.  They share that she lives a therapy dog that visits, and also visits from family.  I asked when was the last time that she had a good day, son Shanon Brow states that this was prior to 3 weeks ago when she started this decline.  The difference between aggressive medical intervention and comfort care was considered in light of the patient's goals of care.  We talked about the concepts of allowing natural death (DNR).  We also discussed the concept of "let nature take its course".  I give examples of when this would be appropriate, and how we would continue to care for people if we are not doing aggressive treatments.  Advanced directives, concepts specific to code status, artifical feeding and hydration, and rehospitalization were considered and discussed.  Carrie Heath and her husband completed  healthcare power of attorney paperwork/living will.  Dominica Severin states that he has had this paper scanned into epic documents.  Carrie Heath has completed MOST form, seen in a photograph on son Gary's phone.  She has elected no CPR, limited time trial of IV fluids and antibiotics, no PEG tube.  Hospice and Palliative Care services outpatient were explained and offered.  Carrie Heath had been active with hospice and palliative care of [facility] prior to arrival.  She is seen monthly by they are palliative provider A. Coady NP.  Family shares that she had been with hospice previously, but graduated.  Son Shanon Brow states that they were waiting for her to finish her most current round of antibiotics, they were going to ask that she be reinstated with hospice care.  We talked about continuing palliative/hospice type care if/when Carrie Heath returns to [place].  Questions and concerns were addressed.  The family was encouraged to call with questions or concerns.  I share that I will evaluate Carrie Heath tomorrow and call Dominica Severin with an update.  Healthcare power of attorney HCPOA -son Dominica Severin states that he has healthcare power of attorney.  He states that nursing staff has scanned this into Carrie Heath's chart.  Son Shanon Brow lives in [place].   SUMMARY OF RECOMMENDATIONS   24 to 48 hours for outcomes. Preference is to return to [facility] if possible.  Code Status/Advance Care Planning:  DNR -confirmed by family.  Carrie Heath has completed MOST form, seen in a photograph on son Gary's phone.  She has elected no CPR, limited time trial of IV fluids and antibiotics, no PEG tube.  Symptom Management:   Per hospitalist, no additional needs at this time.  Palliative Prophylaxis:   Turn Reposition  Additional Recommendations (Limitations, Scope, Preferences):  Continue to treat the treatable but no CPR, no intubation, no PEG tube.  Psycho-social/Spiritual:   Desire for further Chaplaincy  support:no  Additional Recommendations: Caregiving  Support/Resources and Education on Hospice  Prognosis:   Unable to determine, based on outcomes.  Weeks or less would not be surprising based on Staphylococcus aureus bacteremia, albumin of 2.6, functional status requiring 2 person assist for transfers prior to illness, recurrent UTIs, vascular dementia, A. fib and heart failure.  Discharge Planning: Goal is to return to [facility] if possible.  We discussed the possibility that Mrs. Hedtke would need SNF.      Primary Diagnoses: Present on Admission: . Sepsis due to gram-negative UTI ([place]) . DM type 2 with diabetic peripheral neuropathy ([place]) . COPD mixed type ([place]) . Atrial fibrillation,  chronic ([place]) . Benign essential HTN . Vascular dementia   I have reviewed the medical record, interviewed the patient and family, and examined the patient. The following aspects are pertinent.  Past Medical History:  Diagnosis Date  . Bronchitis   . Chronic a-fib ([place])   . Chronic atrial fibrillation ([place])   . COPD (chronic obstructive pulmonary disease) ([place])   . Diabetes mellitus without complication ([place])   . Diastolic congestive heart failure ([place])   . High cholesterol   . HTN (hypertension)    pt denies  . Hyperlipidemia   . Neuropathy    Social History   Socioeconomic History  . Marital status: Widowed    Spouse name: Not on file  . Number of children: 2  . Years of education: Not on file  . Highest education level: Not on file  Occupational History  . Occupation: retired  Scientific laboratory technician  . Financial resource strain: Not on file  . Food insecurity:    Worry: Not on file    Inability: Not on file  . Transportation needs:    Medical: Not on file    Non-medical: Not on file  Tobacco Use  . Smoking status: Former Smoker    Packs/day: 0.50    Types: Cigarettes    Start date: 07/11/1944    Last attempt to quit: 11/11/2006    Years since quitting: 11.3  . Smokeless  tobacco: Former Network engineer and Sexual Activity  . Alcohol use: No    Alcohol/week: 0.0 oz  . Drug use: No  . Sexual activity: Not Currently  Lifestyle  . Physical activity:    Days per week: Not on file    Minutes per session: Not on file  . Stress: Not on file  Relationships  . Social connections:    Talks on phone: Not on file    Gets together: Not on file    Attends religious service: Not on file    Active member of club or organization: Not on file    Attends meetings of clubs or organizations: Not on file    Relationship status: Not on file  Other Topics Concern  . 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 Heart disease Father   . Alzheimer's disease Mother   . Heart disease Brother        CABG   Scheduled Meds: . docusate sodium  100 mg Oral QODAY  . DULoxetine  60 mg Oral QHS  . feeding supplement (GLUCERNA SHAKE)  237 mL Oral BID BM  . feeding supplement (GLUCERNA SHAKE)  237 mL Oral TID BM  . heparin  5,000 Units Subcutaneous Q8H  . hydrocortisone  1 application Rectal BID  . insulin aspart  0-9 Units Subcutaneous TID WC  . mouth rinse  15 mL Mouth Rinse BID  . metoprolol tartrate  25 mg Oral BID  . pantoprazole  40 mg Oral Daily  . polyethylene glycol  17 g Oral Daily  . predniSONE  5 mg Oral Q breakfast  . pregabalin  25 mg Oral BID  . sodium chloride flush  3 mL Intravenous Q12H   Continuous Infusions: . sodium chloride 75 mL/hr at 03/31/18 0422  . sodium chloride    . vancomycin Stopped (03/30/18 1351)   PRN Meds:.sodium chloride, acetaminophen, ibuprofen, ipratropium-albuterol, ondansetron **OR** ondansetron (ZOFRAN) IV,  polyvinyl alcohol, sodium chloride flush Medications Prior to Admission:  Prior to Admission medications   Medication Sig Start Date End Date Taking? Authorizing Provider  acetaminophen (TYLENOL) 325 MG  tablet Take 650 mg by mouth 3 (three) times daily.   Yes [provider]  albuterol (PROAIR HFA) 108 (90 BASE) MCG/ACT inhaler Inhale 2 puffs into the lungs every 4 (four) hours as needed for wheezing or shortness of breath. 10/15/13 03/29/18 Yes Young, Clinton D, MD  bisacodyl (FLEET) 10 MG/30ML ENEM Place 10 mg rectally daily as needed (constipation).   Yes [provider]  carboxymethylcellulose (REFRESH PLUS) 0.5 % SOLN Apply 1 drop to eye 3 (three) times daily as needed.   Yes [provider]  ciprofloxacin (CIPRO) 250 MG tablet Take 250 mg by mouth 2 (two) times daily. For UTI for seven days. 03/24/18  Yes [provider]  diltiazem (CARDIZEM) 30 MG tablet Take 30 mg by mouth 4 (four) times daily.   Yes [provider]  docusate sodium (COLACE) 100 MG capsule Take 100 mg by mouth every other day.    Yes [provider]  DULoxetine (CYMBALTA) 30 MG capsule Take 2 capsules by mouth at bedtime.  07/26/15  Yes [provider]  feeding supplement, GLUCERNA SHAKE, (GLUCERNA SHAKE) LIQD Take 237 mLs by mouth every morning.   Yes [provider]  furosemide (LASIX) 40 MG tablet TAKE (1) TABLET BY MOUTH TWICE DAILY. Patient taking differently: TAKE 0.5 TABLET BY MOUTH ONCE DAILY. 02/01/15  Yes Herminio Commons, MD  hydrocortisone (ANUSOL-HC) 2.5 % rectal cream Place 1 application rectally 2 (two) times daily.   Yes [provider]  Menthol-Methyl Salicylate (BENGAY GREASELESS EX) Apply 1 application topically 2 (two) times daily. Apply to knees topically two times a day for pain.   Yes [provider]  mineral oil liquid Take by mouth daily as needed for moderate constipation. Instill 2 drop in right ear one time a day for seven days for wax build up.   Yes [provider]  nystatin ointment (MYCOSTATIN) Apply 1 application topically. Apply affected areas topically as needed for fungal rash to abdominal  folds, breasts and groin 2 times a day.   Yes [provider]  omeprazole (PRILOSEC) 20 MG capsule Take 1 capsule by mouth daily. 09/10/13  Yes [provider]  polyethylene glycol (MIRALAX / GLYCOLAX) packet Take 17 g by mouth daily.   Yes [provider]  predniSONE (DELTASONE) 5 MG tablet Take 1 tablet (5 mg total) by mouth daily with breakfast. 01/11/15  Yes Young, Clinton D, MD  pregabalin (LYRICA) 25 MG capsule Take 25 mg by mouth 2 (two) times daily.   Yes [provider]  spironolactone (ALDACTONE) 25 MG tablet Take 25 mg by mouth daily.   Yes [provider]  traMADol (ULTRAM) 50 MG tablet Take 50 mg by mouth every 12 (twelve) hours. Reported on 02/05/2016   Yes [provider]  Zinc Oxide (DESITIN) 40 % PSTE Apply topically. Apply to gluteals three times a day for redness.   Yes [provider]   No Known Allergies Review of Systems  Unable to perform ROS: Acuity of condition    Physical Exam  Constitutional: No distress.  Appears acutely/chronically ill, briefly makes but does not keep eye contact  HENT:  Head: Atraumatic.  Cardiovascular: Normal rate.  Irregular heart rate history  Pulmonary/Chest: Effort normal. No respiratory distress.  Abdominal: Soft. She exhibits no distension.  Musculoskeletal: She exhibits no edema.  Neurological: She is alert.  Known dementia  Skin: Skin is warm and dry.  Multiple areas of bruising and skin tears  Psychiatric:  Not fearful  Nursing note and vitals reviewed.   Vital Signs: BP (!) 96/43 (BP Location: Right Arm)   Pulse 74   Temp (!) 97.4 F (36.3 C) (Oral)   Resp (!) 22   Ht _0  (1.575 m)   Wt 62.2 kg (137 lb 2 oz)   SpO2 95%   BMI 25.08 kg/m  Pain Scale: 0-10 POSS *See Group Information*: S-Acceptable,Sleep, easy to arouse Pain Score: 0-No pain   SpO2: SpO2: 95 % O2 Device:SpO2: 95 % O2 Flow Rate: .O2 Flow Rate (L/min): 2 L/min  IO: Intake/output  summary:   Intake/Output Summary (Last 24 hours) at 03/31/2018 1309 Last data filed at 03/31/2018 0900 Gross per 24 hour  Intake 2430 ml  Output 1025 ml  Net 1405 ml    LBM: Last BM Date: 03/29/18 Baseline Weight: Weight: 59 kg (130 lb) Most recent weight: Weight: 62.2 kg (137 lb 2 oz)     Palliative Assessment/Data:   Flowsheet Rows     Most Recent Value  Intake Tab  Referral Department  Hospitalist  Unit at Time of Referral  Cardiac/Telemetry Unit  Palliative Care Primary Diagnosis  Sepsis/Infectious Disease  Date Notified  03/30/18  Palliative Care Type  New Palliative care  Reason for referral  Clarify Goals of Care  Date of Admission  03/29/18  Date first seen by Palliative Care  03/31/18  # of days Palliative referral response time  1 Day(s)  # of days IP prior to Palliative referral  1  Clinical Assessment  Palliative Performance Scale Score  20%  Pain Max last 24 hours  Not able to report  Pain Min Last 24 hours  Not able to report  Dyspnea Max Last 24 Hours  Not able to report  Dyspnea Min Last 24 hours  Not able to report  Psychosocial & Spiritual Assessment  Palliative Care Outcomes  Patient/Family meeting held?  Yes  Who was at the meeting?  Son Dominica Severin at bedside son Shanon Brow via phone  [place] regarding hospice, Provided advance care planning, Provided end of life care assistance, Clarified goals of care  Patient/Family wishes: Interventions discontinued/not started   Mechanical Ventilation, Tube feedings/TPN, PEG      Time In: 1020 Time Out: 1210 Time Total: 110 minutes Greater than 50%  of this time was spent counseling and coordinating care related to the above assessment and plan.  Signed by: Drue Novel, NP   Please contact Palliative Medicine Team phone at [phone] for questions and concerns.  For individual provider: See Shea Evans

## 2018-03-31 NOTE — Progress Notes (Signed)
Pt's B/P 96/43 carvedilol held. MD has been notified.

## 2018-03-31 NOTE — Progress Notes (Signed)
Ongoing temp remains > 102 F rectally with Tylenol and Ibuprofen rotation. Ibuprofen next available at 0330. Resp noted at 28. BP stable. Robb Matar, MD paged regarding concerns. Toradol and cooling blanket ordered. Palliative consult already in place. Will continue to monitor.

## 2018-04-01 ENCOUNTER — Inpatient Hospital Stay (HOSPITAL_COMMUNITY): Payer: Medicare Other

## 2018-04-01 DIAGNOSIS — I371 Nonrheumatic pulmonary valve insufficiency: Secondary | ICD-10-CM

## 2018-04-01 DIAGNOSIS — I361 Nonrheumatic tricuspid (valve) insufficiency: Secondary | ICD-10-CM

## 2018-04-01 LAB — CULTURE, BLOOD (ROUTINE X 2)
Special Requests: ADEQUATE
Special Requests: ADEQUATE

## 2018-04-01 LAB — GLUCOSE, CAPILLARY
GLUCOSE-CAPILLARY: 158 mg/dL — AB (ref 65–99)
Glucose-Capillary: 113 mg/dL — ABNORMAL HIGH (ref 65–99)
Glucose-Capillary: 136 mg/dL — ABNORMAL HIGH (ref 65–99)
Glucose-Capillary: 176 mg/dL — ABNORMAL HIGH (ref 65–99)

## 2018-04-01 LAB — URINE CULTURE

## 2018-04-01 LAB — ECHOCARDIOGRAM COMPLETE
HEIGHTINCHES: 62 in
WEIGHTICAEL: 2194.02 [oz_av]

## 2018-04-01 LAB — CREATININE, SERUM
Creatinine, Ser: 0.7 mg/dL (ref 0.44–1.00)
GFR calc Af Amer: >60 mL/min (ref >60)
GFR calc non Af Amer: >60 mL/min (ref >60)

## 2018-04-01 NOTE — Progress Notes (Signed)
PO medications not given at this time due to patient not being alert enough to swallow medications comfortably. -provider alerted via AMION

## 2018-04-01 NOTE — Care Management Important Message (Signed)
Important Message  Patient Details  Name: Carrie Heath MRN: [id number] Date of Birth: [birth year]   Medicare Important Message Given:  Yes    Renie Ora 04/01/2018, 3:06 PM

## 2018-04-01 NOTE — Progress Notes (Signed)
       INFECTIOUS DISEASE ATTENDING ADDENDUM:   Date: 04/01/2018  Patient name: Carrie Heath  Medical record number: [id number]  Date of birth: 10-22-1921      Acey Lav 04/01/2018, 4:50 PM        [place] Antimicrobial Management Team Staphylococcus aureus bacteremia   Staphylococcus aureus bacteremia (SAB) is associated with a high rate of complications and mortality.  Specific aspects of clinical management are critical to optimizing the outcome of patients with SAB.  Therefore, the [facility] Antimicrobial Management Team [facility]) has initiated an intervention aimed at improving the management of SAB at [facility].  To do so, Infectious Diseases physicians are providing an evidence-based consult for the management of all patients with SAB.     Yes No Comments  Perform follow-up blood cultures (even if the patient is afebrile) to ensure clearance of bacteremia     Remove vascular catheter and obtain follow-up blood cultures after the removal of the catheter   DO NOT PLACE PICC OR CENTRAL LINE UNTIL WE HAVE CLEARED BACTEREMIA  Perform echocardiography to evaluate for endocarditis (transthoracic ECHO is 40-50% sensitive, TEE is > 90% sensitive)   Please keep in mind, that neither test can definitively EXCLUDE endocarditis, and that should clinical suspicion remain high for endocarditis the patient should then still be treated with an "endocarditis" duration of therapy = 6 weeks  2D ECHO SHOWS VEGETATION  1.8 x 1.1 cm ON MITRAL VALVE  Consult electrophysiologist to evaluate implanted cardiac device (pacemaker, ICD)     Ensure source control   Have all abscesses been drained effectively? Have deep seeded infections (septic joints or osteomyelitis) had appropriate surgical debridement?  Investigate for "metastatic" sites of infection   Does the patient have ANY symptom or physical exam finding that would suggest a deeper infection (back or  neck pain that may be suggestive of vertebral osteomyelitis or epidural abscess, muscle pain that could be a symptom of pyomyositis)?  Keep in mind that for deep seeded infections MRI imaging with contrast is preferred rather than other often insensitive tests such as plain x-rays, especially early in a patient's presentation.  Change antibiotic therapy to vancomycyin    Beta-lactam antibiotics are preferred for MSSA due to higher cure rates.   If on Vancomycin, goal trough should be 15 - 20 mcg/mL  Estimated duration of IV antibiotic therapy:  6 weeks   Consult case management for probably prolonged outpatient IV antibiotic therapy    Will touch base with team tomorrow.

## 2018-04-01 NOTE — Progress Notes (Signed)
Pharmacy Antibiotic Note  Carrie Heath is a 84 y.o. female admitted on 03/29/2018 with bacteremia and sepsis.  Pharmacy has been consulted for Vancomycin dosing.  Patient is also on meropenem for K. Pneumoniae with ESBL UTI.  Plan: Vancomycin 1000 mg IV every 24 hours.  Goal trough 15-20 mcg/mL.  Monitor labs, c/s, and vanco trough as needed Fosfomycin 5/20 for UTI  Height:  (157.5 cm) Weight: 137 lb 2 oz (62.2 kg) IBW/kg (Calculated) : 50.1  Temp (24hrs), Avg:98.5 F (36.9 C), Min:97.8 F (36.6 C), Max:99.3 F (37.4 C)  Recent Labs  Lab 03/29/18 1135 03/29/18 1335 03/30/18 0449 04/01/18 0421  WBC 24.3*  --  19.8*  --   CREATININE 1.18*  --  0.89 0.70  LATICACIDVEN 2.1* 2.1*  --   --     Estimated Creatinine Clearance: 37.3 mL/min (by C-G formula based on SCr of 0.7 mg/dL).    No Known Allergies  Antimicrobials this admission: Vanco 5/19 >>  Meropenem 5/19 >> 5/20 Fosfomycin 5/20  Dose adjustments this admission: N/A  Microbiology results: 5/20 BCx: ngtd x 2 days 5/19 BCx: gram + cocci 3/4 5/10 UCx: K. Pneumoniae ESBL 5/19 UCx: K. Pneumoniae   5/19 MRSA PCR: positive  Thank you for allowing pharmacy to be a part of this patient's care.  Tad Moore 04/01/2018 1:31 PM

## 2018-04-01 NOTE — Progress Notes (Signed)
PROGRESS NOTE    Carrie Heath  EAV:[id number] DOB: 10/14/1921 DOA: 03/29/2018 PCP: Benita Stabile (Inactive)   Brief Narrative:  84 year old with past medical history relevant for COPD, hyperlipidemia, type 2 diabetes, chronic atrial fibrillation, chronic diastolic heart failure, vascular dementia, hypertension who was admitted from assisted living facility with fever and altered mental status and found to have MRSA bacteremia.   Assessment & Plan:   Principal Problem:   Sepsis due to gram-negative UTI (HCC) Active Problems:   DM type 2 with diabetic peripheral neuropathy (HCC)   HLD (hyperlipidemia)   COPD mixed type (HCC)   Chronic diastolic HF (heart failure) (HCC)   Atrial fibrillation, chronic (HCC)   Benign essential HTN   Vascular dementia   Palliative care by specialist   Goals of care, counseling/discussion   Encounter for hospice care discussion   #) Sepsis from MRSA bacteremia: Suspect this most likely explains patient's fever and altered mental status.  Her baseline is difficult to know however currently she does not appear to be mentating appropriately.  She can only motor words and does not respond to stimulus.  Suspect the most likely source is her numerous numerous skin tears in the skin flora. - Blood cultures from 03/29/2018 2 of 2 growing MRSA - Blood cultures from 03/30/2018 no growth to date -Continue IV vancomycin started 03/29/2018 -Echo pending -Extensive discussions with the family about goals of care pending with palliative care  #) Altered mental status/vascular dementia: Unclear baseline however likely in the setting of sepsis and illness along with vascular dementia -Frequent reorientation -Discussed with son about baseline - Patient had Klebsiella UTI that was treated with fosfomycin dose  #) Paroxysmal atrial fibrillation/chronic diastolic heart failure: Currently stable -Hold diltiazem 30 mg 4 times daily - Not on any anticoagulation due to  advanced age - Hold furosemide 20 mg daily  #) COPD: -Continue current prednisone 5 mg daily, low threshold to start stress dose steroids if patient becomes unstable -Continue PRN bronchodilators  #) Hypertension/hyperlipidemia: -Not on statins based on age -Hold diltiazem at this time, blood pressure stable -Hold spinal lactone 25 mg daily  #) GERD: -Continue PPI  #) Type 2 diabetes, gated by peripheral neuropathy: Diet controlled -Sliding scale insulin -Continue pregabalin 25 mg twice daily  #) Multiple skin tears: Likely source of patient's MRSA bacteremia is multiple skin tears.  Patient is extremely friable skin due to advanced age and prednisone use. -Wound care consult -Continue zinc ointment, topical nystatin  #) Pain/psych: -Continue pregabalin - Continue duloxetine 60 mg nightly  Fluids: Gentle IV fluids Electrolytes: Monitor and supplement Nutrition: Dysphagia 1 diet  Prophylaxis: Heparin  Disposition: Pending evaluation of possible endocarditis and discussion of course of antibiotics and goals of care  DO NOT RESUSCITATE  Consultants:   Palliative care  Infectious disease  Procedures:   Echo 04/01/2018: Pending  Antimicrobials:   IV vancomycin started 03/29/2018 to ongoing  Meropenem 03/29/2018 to 03/30/2018  Fosfomycin 1 dose 03/30/2018   Subjective: Patient cannot tell me if she is any pain.  She simply stares off into the distance.  She does move and respond however she does not respond appropriately.    Objective: Vitals:   03/31/18 1038 03/31/18 1607 03/31/18 2117 04/01/18 0444  BP: (!) 96/43 (!) 106/42 113/71 116/81  Pulse: 74 94 (!) 105 (!) 107  Resp:  (!) 22 (!) 21 18  Temp:  98.5 F (36.9 C) 99.3 F (37.4 C) 97.8 F (36.6 C)  TempSrc:  Rectal Rectal Oral  SpO2: 95% 98% 99% 100%  Weight:      Height:        Intake/Output Summary (Last 24 hours) at 04/01/2018 1107 Last data filed at 04/01/2018 0854 Gross per 24 hour  Intake  2165 ml  Output 500 ml  Net 1665 ml   Filed Weights   03/29/18 1114 03/29/18 1442 03/30/18 0609  Weight: 59 kg (130 lb) 62.1 kg (136 lb 14.5 oz) 62.2 kg (137 lb 2 oz)    Examination:  General exam: No acute distress Respiratory system: No increased work of breathing, anterior lung fields clear Cardiovascular system: Distant heart sounds, regular rate and rhythm, no murmurs Gastrointestinal system: Soft, nontender, no rebound or guarding Central nervous system: No focal neurological deficits Extremities: 2+ lower extremity edema Skin: Numerous skin tears Psychiatry: Unable to assess due to medical condition    Data Reviewed: I have personally reviewed following labs and imaging studies  CBC: Recent Labs  Lab 03/29/18 1135 03/30/18 0449  WBC 24.3* 19.8*  NEUTROABS 22.4*  --   HGB 13.4 12.1  HCT 41.2 38.6  MCV 99.3 100.8*  PLT 265 232   Basic Metabolic Panel: Recent Labs  Lab 03/29/18 1135 03/29/18 1639 03/30/18 0449 04/01/18 0421  NA 134*  --  140  --   K 5.2*  --  3.9  --   CL 96*  --  105  --   CO2 26  --  24  --   GLUCOSE 210*  --  151*  --   BUN 35*  --  30*  --   CREATININE 1.18*  --  0.89 0.70  CALCIUM 8.7*  --  8.0*  --   MG  --  1.7  --   --   PHOS  --  3.0  --   --    GFR: Estimated Creatinine Clearance: 37.3 mL/min (by C-G formula based on SCr of 0.7 mg/dL). Liver Function Tests: Recent Labs  Lab 03/29/18 1135  AST 41  ALT 42  ALKPHOS 97  BILITOT 1.1  PROT 6.8  ALBUMIN 2.6*   No results for input(s): LIPASE, AMYLASE in the last 168 hours. No results for input(s): AMMONIA in the last 168 hours. Coagulation Profile: No results for input(s): INR, PROTIME in the last 168 hours. Cardiac Enzymes: No results for input(s): CKTOTAL, CKMB, CKMBINDEX, TROPONINI in the last 168 hours. BNP (last 3 results) No results for input(s): PROBNP in the last 8760 hours. HbA1C: Recent Labs    03/29/18 1639  HGBA1C 7.5*   CBG: Recent Labs  Lab  03/31/18 0807 03/31/18 1145 03/31/18 1619 03/31/18 2117 04/01/18 0754  GLUCAP 140* 139* 226* 186* 113*   Lipid Profile: No results for input(s): CHOL, HDL, LDLCALC, TRIG, CHOLHDL, LDLDIRECT in the last 72 hours. Thyroid Function Tests: Recent Labs    03/29/18 1639  TSH 0.484   Anemia Panel: No results for input(s): VITAMINB12, FOLATE, FERRITIN, TIBC, IRON, RETICCTPCT in the last 72 hours. Sepsis Labs: Recent Labs  Lab 03/29/18 1135 03/29/18 1335  LATICACIDVEN 2.1* 2.1*    Recent Results (from the past 240 hour(s))  Blood Culture (routine x 2)     Status: Abnormal   Collection Time: 03/29/18 11:35 AM  Result Value Ref Range Status   Specimen Description   Final    RIGHT ANTECUBITAL Performed at [facility], [address] [place], [place] [address]    Special Requests   Final    BOTTLES DRAWN AEROBIC  AND ANAEROBIC Blood Culture adequate volume Performed at [facility], [address] [place], [place] [address]    Culture  Setup Time   Final    GRAM POSITIVE COCCI Gram Stain Report Called to,Read Back By and Verified With: BONDURANT, N. AT 0223 ON 03/30/2018 BY EVA AEROBIC BOTTLE ONLY Performed at [facility] Performed at [facility], [address], [place], [place] [address]    Culture (A)  Final    STAPHYLOCOCCUS AUREUS SUSCEPTIBILITIES PERFORMED ON PREVIOUS CULTURE WITHIN THE LAST 5 DAYS. Performed at [facility], [address], [place], [place] [address]    Report Status 04/01/2018 FINAL  Final  Blood Culture (routine x 2)     Status: Abnormal   Collection Time: 03/29/18 11:35 AM  Result Value Ref Range Status   Specimen Description   Final    BLOOD RIGHT HAND Performed at [facility], [address] [place], [place] [address]    Special Requests   Final    BOTTLES DRAWN AEROBIC AND ANAEROBIC Blood Culture adequate volume Performed at [facility], [address], [place], [place] [address]    Culture  Setup Time   Final    GRAM  POSITIVE COCCI Gram Stain Report Called to,Read Back By and Verified With: BONDURANT,N. AT 0223 ON 03/30/2018 BY EVA IN BOTH AEROBIC AND ANAEROBIC BOTTLES Performed at [facility]    Culture METHICILLIN RESISTANT STAPHYLOCOCCUS AUREUS (A)  Final   Report Status 04/01/2018 FINAL  Final   Organism ID, Bacteria METHICILLIN RESISTANT STAPHYLOCOCCUS AUREUS  Final      Susceptibility   Methicillin resistant staphylococcus aureus - MIC*    CIPROFLOXACIN >=8 RESISTANT Resistant     ERYTHROMYCIN >=8 RESISTANT Resistant     GENTAMICIN <=0.5 SENSITIVE Sensitive     OXACILLIN >=4 RESISTANT Resistant     TETRACYCLINE <=1 SENSITIVE Sensitive     VANCOMYCIN <=0.5 SENSITIVE Sensitive     TRIMETH/SULFA <=10 SENSITIVE Sensitive     CLINDAMYCIN >=8 RESISTANT Resistant     RIFAMPIN <=0.5 SENSITIVE Sensitive     Inducible Clindamycin NEGATIVE Sensitive     * METHICILLIN RESISTANT STAPHYLOCOCCUS AUREUS  Urine culture     Status: Abnormal   Collection Time: 03/29/18 11:35 AM  Result Value Ref Range Status   Specimen Description   Final    URINE, CLEAN CATCH Performed at [facility], [address], [place], [place] [address]    Special Requests   Final    NONE Performed at [facility], [address] [place], [place] [address]    Culture >=100,000 COLONIES/mL KLEBSIELLA PNEUMONIAE (A)  Final   Report Status 04/01/2018 FINAL  Final   Organism ID, Bacteria KLEBSIELLA PNEUMONIAE (A)  Final      Susceptibility   Klebsiella pneumoniae - MIC*    AMPICILLIN >=32 RESISTANT Resistant     CEFAZOLIN >=64 RESISTANT Resistant     CEFTRIAXONE <=1 SENSITIVE Sensitive     CIPROFLOXACIN >=4 RESISTANT Resistant     GENTAMICIN >=16 RESISTANT Resistant     IMIPENEM 0.5 SENSITIVE Sensitive     NITROFURANTOIN 128 RESISTANT Resistant     TRIMETH/SULFA >=320 RESISTANT Resistant     AMPICILLIN/SULBACTAM >=32 RESISTANT Resistant     PIP/TAZO 32 INTERMEDIATE Intermediate     Extended ESBL NEGATIVE Sensitive      * >=100,000 COLONIES/mL KLEBSIELLA PNEUMONIAE  Blood Culture ID Panel (Reflexed)     Status: Abnormal   Collection Time: 03/29/18 11:35 AM  Result Value  Ref Range Status   Enterococcus species NOT DETECTED NOT DETECTED Final   Listeria monocytogenes NOT DETECTED NOT DETECTED Final   Staphylococcus species DETECTED (A) NOT DETECTED Final    Comment: CRITICAL RESULT CALLED TO, READ BACK BY AND VERIFIED WITH: LORI POOLE PHARMD AT 0907 ON [id number] BY SJW    Staphylococcus aureus DETECTED (A) NOT DETECTED Final    Comment: Methicillin (oxacillin)-resistant Staphylococcus aureus (MRSA). MRSA is predictably resistant to beta-lactam antibiotics (except ceftaroline). Preferred therapy is vancomycin unless clinically contraindicated. Patient requires contact precautions if  hospitalized. CRITICAL RESULT CALLED TO, READ BACK BY AND VERIFIED WITH: LORI POOLE PHARMD AT 0454 ON [id number] BY SJW    Methicillin resistance DETECTED (A) NOT DETECTED Final    Comment: CRITICAL RESULT CALLED TO, READ BACK BY AND VERIFIED WITH: LORI POOLE PHARMD AT 0907 ON [id number] BY SJW    Streptococcus species NOT DETECTED NOT DETECTED Final   Streptococcus agalactiae NOT DETECTED NOT DETECTED Final   Streptococcus pneumoniae NOT DETECTED NOT DETECTED Final   Streptococcus pyogenes NOT DETECTED NOT DETECTED Final   Acinetobacter baumannii NOT DETECTED NOT DETECTED Final   Enterobacteriaceae species NOT DETECTED NOT DETECTED Final   Enterobacter cloacae complex NOT DETECTED NOT DETECTED Final   Escherichia coli NOT DETECTED NOT DETECTED Final   Klebsiella oxytoca NOT DETECTED NOT DETECTED Final   Klebsiella pneumoniae NOT DETECTED NOT DETECTED Final   Proteus species NOT DETECTED NOT DETECTED Final   Serratia marcescens NOT DETECTED NOT DETECTED Final   Haemophilus influenzae NOT DETECTED NOT DETECTED Final   Neisseria meningitidis NOT DETECTED NOT DETECTED Final   Pseudomonas aeruginosa NOT DETECTED NOT DETECTED Final    Candida albicans NOT DETECTED NOT DETECTED Final   Candida glabrata NOT DETECTED NOT DETECTED Final   Candida krusei NOT DETECTED NOT DETECTED Final   Candida parapsilosis NOT DETECTED NOT DETECTED Final   Candida tropicalis NOT DETECTED NOT DETECTED Final    Comment: Performed at [facility], [address] [place], [place] [address]  MRSA PCR Screening     Status: Abnormal   Collection Time: 03/29/18  2:30 PM  Result Value Ref Range Status   MRSA by PCR POSITIVE (A) NEGATIVE Final    Comment:        The GeneXpert MRSA Assay (FDA approved for NASAL specimens only), is one component of a comprehensive MRSA colonization surveillance program. It is not intended to diagnose MRSA infection nor to guide or monitor treatment for MRSA infections. RESULT CALLED TO, READ BACK BY AND VERIFIED WITH: BONDURANT,R @ 2057 ON 03/29/18 BY JUW Performed at [facility], [address], [place], [place] [address]   Culture, blood (Routine X 2) w Reflex to ID Panel     Status: None (Preliminary result)   Collection Time: 03/30/18  1:25 PM  Result Value Ref Range Status   Specimen Description BLOOD LEFT ARM  Final   Special Requests   Final    BOTTLES DRAWN AEROBIC ONLY Blood Culture adequate volume   Culture  Setup Time   Final    GRAM POSITIVE COCCI AEROBIC BOTTLE ONLY Gram Stain Report Called to,Read Back By and Verified With: T.BULLOCK, RN @ 0407 ON 5.22.19 BY L.BOWMAN    Culture   Final    NO GROWTH 2 DAYS Performed at [facility], [address] [place], [place] [address]    Report Status PENDING  Incomplete  Culture, blood (Routine X 2) w Reflex to ID Panel  Status: None (Preliminary result)   Collection Time: 03/30/18  1:26 PM  Result Value Ref Range Status   Specimen Description BLOOD RIGHT WRIST  Final   Special Requests   Final    BOTTLES DRAWN AEROBIC AND ANAEROBIC Blood Culture results may not be optimal due to an inadequate volume of blood received in culture bottles    Culture   Final    NO GROWTH 2 DAYS Performed at [facility], [address], [place], [place] [address]    Report Status PENDING  Incomplete         Radiology Studies: No results found.      Scheduled Meds: . Chlorhexidine Gluconate Cloth  6 each Topical Q0600  . docusate sodium  100 mg Oral QODAY  . DULoxetine  60 mg Oral QHS  . feeding supplement (GLUCERNA SHAKE)  237 mL Oral BID BM  . feeding supplement (GLUCERNA SHAKE)  237 mL Oral TID BM  . heparin  5,000 Units Subcutaneous Q8H  . hydrocortisone  1 application Rectal BID  . insulin aspart  0-9 Units Subcutaneous TID WC  . mouth rinse  15 mL Mouth Rinse BID  . metoprolol tartrate  12.5 mg Oral BID  . mupirocin ointment  1 application Nasal BID  . pantoprazole  40 mg Oral Daily  . polyethylene glycol  17 g Oral Daily  . predniSONE  5 mg Oral Q breakfast  . pregabalin  25 mg Oral BID  . sodium chloride flush  3 mL Intravenous Q12H   Continuous Infusions: . sodium chloride 75 mL/hr at 04/01/18 0542  . sodium chloride    . vancomycin Stopped (03/31/18 1418)     LOS: 3 days    Time spent: 35    Delaine Lame, MD [facility]  If 7PM-7AM, please contact night-coverage [email or web address] Password [facility] 04/01/2018, 11:07 AM

## 2018-04-01 NOTE — Progress Notes (Signed)
Palliative: Carrie Heath is lying quietly in bed.  She will briefly make but not keep eye contact.  She appears acutely/chronically ill, frail.  She is able to tell me her name after several prompts, but otherwise has unintelligible speech.  She does not appear to be in any acute discomfort.  She is able to take several sips of water without overt signs of aspiration. Call to son Carrie Heath, discussed [name]'s current situation.  Carrie Heath states that his brother Carrie Heath and wife Carrie Heath drove in from [place], they are expected to be at Carrie Heath's bedside this afternoon.  Carrie Heath states that he will also be at bedside this afternoon.  All questions answered, with plan for follow-up 5/23. Conference with hospitalist related to plan of care, disposition. 35 minutes [name], NP Palliative Medicine Team Team Phone # [phone]

## 2018-04-01 NOTE — Progress Notes (Signed)
*  PRELIMINARY RESULTS* Echocardiogram 2D Echocardiogram has been performed.  Stacey Drain 04/01/2018, 2:55 PM

## 2018-04-01 NOTE — Progress Notes (Signed)
CRITICAL VALUE ALERT  Critical Value: Blood culture Aerobic bottle Gram + cocci  Date & Time Notied:  04/01/18 0407  Provider Notified: Dr. Robb Matar  Orders Received/Actions taken: No new orders given at this time.

## 2018-04-02 LAB — GLUCOSE, CAPILLARY
Glucose-Capillary: 122 mg/dL — ABNORMAL HIGH (ref 65–99)
Glucose-Capillary: 168 mg/dL — ABNORMAL HIGH (ref 65–99)

## 2018-04-02 LAB — COMPREHENSIVE METABOLIC PANEL WITH GFR
ALT: 33 U/L (ref 14–54)
AST: 16 U/L (ref 15–41)
Albumin: 2.1 g/dL — ABNORMAL LOW (ref 3.5–5.0)
CO2: 22 mmol/L (ref 22–32)
Chloride: 112 mmol/L — ABNORMAL HIGH (ref 101–111)
Creatinine, Ser: 0.66 mg/dL (ref 0.44–1.00)
GFR calc non Af Amer: >60 mL/min (ref >60)
Potassium: 3.2 mmol/L — ABNORMAL LOW (ref 3.5–5.1)
Sodium: 142 mmol/L (ref 135–145)

## 2018-04-02 LAB — MAGNESIUM: Magnesium: 1.8 mg/dL (ref 1.7–2.4)

## 2018-04-02 LAB — CBC
HCT: 38.8 % (ref 36.0–46.0)
Hemoglobin: 12 g/dL (ref 12.0–15.0)
MCH: 31 pg (ref 26.0–34.0)
MCHC: 30.9 g/dL (ref 30.0–36.0)
MCV: 100.3 fL — ABNORMAL HIGH (ref 78.0–100.0)
Platelets: 244 K/uL (ref 150–400)
RBC: 3.87 MIL/uL (ref 3.87–5.11)
RDW: 14.2 % (ref 11.5–15.5)
WBC: 15.2 K/uL — ABNORMAL HIGH (ref 4.0–10.5)

## 2018-04-02 LAB — COMPREHENSIVE METABOLIC PANEL
Alkaline Phosphatase: 60 U/L (ref 38–126)
Anion gap: 8 (ref 5–15)
BUN: 18 mg/dL (ref 6–20)
Calcium: 8 mg/dL — ABNORMAL LOW (ref 8.9–10.3)
GFR calc Af Amer: >60 mL/min (ref >60)
Glucose, Bld: 123 mg/dL — ABNORMAL HIGH (ref 65–99)
Total Bilirubin: 0.9 mg/dL (ref 0.3–1.2)
Total Protein: 5.6 g/dL — ABNORMAL LOW (ref 6.5–8.1)

## 2018-04-02 MED ORDER — POTASSIUM CHLORIDE CRYS ER 20 MEQ PO TBCR
40.0000 meq | EXTENDED_RELEASE_TABLET | Freq: Once | ORAL | Status: AC
  Administered 2018-04-02: 40 meq via ORAL
  Filled 2018-04-02: qty 2

## 2018-04-02 NOTE — Clinical Social Work Note (Signed)
Discharge clinicals sent to [facility] and transport arranged with [facility]. [facility] are aware of discharge.  LCSW signing off.     Dalyn Becker, Juleen China, LCSW

## 2018-04-02 NOTE — Clinical Social Work Note (Signed)
Referral sent to [facility].    Carrie Heath, Juleen China, LCSW

## 2018-04-02 NOTE — Progress Notes (Signed)
PROGRESS NOTE    Carrie Heath  ZOX:[id number] DOB: August 18, 1921 DOA: 03/29/2018 PCP: Benita Stabile (Inactive)   Brief Narrative:  84 year old with past medical history relevant for COPD, hyperlipidemia, type 2 diabetes, chronic atrial fibrillation, chronic diastolic heart failure, vascular dementia, hypertension who was admitted from assisted living facility with fever and altered mental status and found to have MRSA bacteremia.   Assessment & Plan:   Principal Problem:   Sepsis due to gram-negative UTI (HCC) Active Problems:   DM type 2 with diabetic peripheral neuropathy (HCC)   HLD (hyperlipidemia)   COPD mixed type (HCC)   Chronic diastolic HF (heart failure) (HCC)   Atrial fibrillation, chronic (HCC)   Benign essential HTN   Vascular dementia   Palliative care by specialist   Goals of care, counseling/discussion   Encounter for hospice care discussion   #)MRSA endocarditis: - Blood cultures from 03/29/2018 2 of 2 growing MRSA - Blood cultures from 03/30/2018 no growth to date -Continue IV vancomycin started 03/29/2018 -Echo on 04/01/2018 shows 1.8 x 1.1 vegetation on the atrial surface of the mitral valve -Plan to continue IV antibiotics until placement inpatient hospice  #) Altered mental status/vascular dementia: Per the family baseline is that she is conversant and is only not ambulatory due to severe osteoarthritis of her knees. -Unfortunately with her endocarditis with MRSA her prognosis is quite poor and  certainly less than 6 weeks.  Will proceed forward with discussing hospice and placing patient in inpatient hospice. -Frequent reorientation - Patient had Klebsiella UTI that was treated with fosfomycin dose  #) Paroxysmal atrial fibrillation/chronic diastolic heart failure: Currently stable -Hold diltiazem 30 mg 4 times daily - Not on any anticoagulation due to advanced age - Hold furosemide 20 mg daily  #) COPD: -Continue current prednisone 5 mg daily, low  threshold to start stress dose steroids if patient becomes unstable -Continue PRN bronchodilators  #) Hypertension/hyperlipidemia: -Not on statins based on age -Hold diltiazem at this time, blood pressure stable -Hold spinal lactone 25 mg daily  #) GERD: -Continue PPI  #) Type 2 diabetes, gated by peripheral neuropathy: Diet controlled -Sliding scale insulin -Continue pregabalin 25 mg twice daily  #) Multiple skin tears: Likely source of patient's MRSA bacteremia is multiple skin tears.  Patient is extremely friable skin due to advanced age and prednisone use. -Wound care consult -Continue zinc ointment, topical nystatin  #) Pain/psych: -Continue pregabalin - Continue duloxetine 60 mg nightly  Fluids: Gentle IV fluids Electrolytes: Monitor and supplement Nutrition: Dysphagia 1 diet  Prophylaxis: Heparin  Disposition: Pending evaluation of possible endocarditis and discussion of course of antibiotics and goals of care  DO NOT RESUSCITATE  Consultants:   Palliative care  Infectious disease  Procedures:  Echo 04/01/2018:- Left ventricle: Wall thickness was increased in a pattern of mild   LVH. - Aortic valve: There was mild to moderate regurgitation. - Mitral valve: 1.8 x 1.1 cm vegetation on the atrial surface of   the anterior mitral leaflet Severely calcified annulus.   Moderately thickened, moderately calcified leaflets . There was   mild regurgitation. - Left atrium: The atrium was moderately dilated. - Right atrium: The atrium was moderately dilated. - Atrial septum: There was a patent foramen ovale.  - Tricuspid valve: There was moderate regurgitation.  Antimicrobials:   IV vancomycin started 03/29/2018 to ongoing  Meropenem 03/29/2018 to 03/30/2018  Fosfomycin 1 dose 03/30/2018   Subjective: Patient cannot tell me if she is any pain and does not respond  to any questions.  Objective: Vitals:   04/01/18 0444 04/01/18 2136 04/02/18 0622 04/02/18 1004    BP: 116/81 124/86 119/84 132/87  Pulse: (!) 107 (!) 104 (!) 112 91  Resp: 18   18  Temp: 97.8 F (36.6 C) 99 F (37.2 C) 98.3 F (36.8 C) (!) 97.5 F (36.4 C)  TempSrc: Oral Oral Oral Oral  SpO2: 100% 99% 100% 100%  Weight:      Height:        Intake/Output Summary (Last 24 hours) at 04/02/2018 1128 Last data filed at 04/02/2018 8295 Gross per 24 hour  Intake 1008 ml  Output 1000 ml  Net 8 ml   Filed Weights   03/29/18 1114 03/29/18 1442 03/30/18 0609  Weight: 59 kg (130 lb) 62.1 kg (136 lb 14.5 oz) 62.2 kg (137 lb 2 oz)    Examination:  General exam: No acute distress Respiratory system: No increased work of breathing, anterior lung fields clear Cardiovascular system: Distant heart sounds, regular rate and rhythm, no murmurs Gastrointestinal system: Soft, nontender, no rebound or guarding Central nervous system: No focal neurological deficits Extremities: 2+ lower extremity edema Skin: Numerous skin tears Psychiatry: Unable to assess due to medical condition    Data Reviewed: I have personally reviewed following labs and imaging studies  CBC: Recent Labs  Lab 03/29/18 1135 03/30/18 0449 04/02/18 0646  WBC 24.3* 19.8* 15.2*  NEUTROABS 22.4*  --   --   HGB 13.4 12.1 12.0  HCT 41.2 38.6 38.8  MCV 99.3 100.8* 100.3*  PLT 265 232 244   Basic Metabolic Panel: Recent Labs  Lab 03/29/18 1135 03/29/18 1639 03/30/18 0449 04/01/18 0421 04/02/18 0646  NA 134*  --  140  --  142  K 5.2*  --  3.9  --  3.2*  CL 96*  --  105  --  112*  CO2 26  --  24  --  22  GLUCOSE 210*  --  151*  --  123*  BUN 35*  --  30*  --  18  CREATININE 1.18*  --  0.89 0.70 0.66  CALCIUM 8.7*  --  8.0*  --  8.0*  MG  --  1.7  --   --  1.8  PHOS  --  3.0  --   --   --    GFR: Estimated Creatinine Clearance: 37.3 mL/min (by C-G formula based on SCr of 0.66 mg/dL). Liver Function Tests: Recent Labs  Lab 03/29/18 1135 04/02/18 0646  AST 41 16  ALT 42 33  ALKPHOS 97 60  BILITOT  1.1 0.9  PROT 6.8 5.6*  ALBUMIN 2.6* 2.1*   No results for input(s): LIPASE, AMYLASE in the last 168 hours. No results for input(s): AMMONIA in the last 168 hours. Coagulation Profile: No results for input(s): INR, PROTIME in the last 168 hours. Cardiac Enzymes: No results for input(s): CKTOTAL, CKMB, CKMBINDEX, TROPONINI in the last 168 hours. BNP (last 3 results) No results for input(s): PROBNP in the last 8760 hours. HbA1C: No results for input(s): HGBA1C in the last 72 hours. CBG: Recent Labs  Lab 04/01/18 0754 04/01/18 1154 04/01/18 1635 04/01/18 2136 04/02/18 0807  GLUCAP 113* 158* 136* 176* 122*   Lipid Profile: No results for input(s): CHOL, HDL, LDLCALC, TRIG, CHOLHDL, LDLDIRECT in the last 72 hours. Thyroid Function Tests: No results for input(s): TSH, T4TOTAL, FREET4, T3FREE, THYROIDAB in the last 72 hours. Anemia Panel: No results for input(s): VITAMINB12, FOLATE, FERRITIN, TIBC, IRON,  RETICCTPCT in the last 72 hours. Sepsis Labs: Recent Labs  Lab 03/29/18 1135 03/29/18 1335  LATICACIDVEN 2.1* 2.1*    Recent Results (from the past 240 hour(s))  Blood Culture (routine x 2)     Status: Abnormal   Collection Time: 03/29/18 11:35 AM  Result Value Ref Range Status   Specimen Description   Final    RIGHT ANTECUBITAL Performed at [facility], [address] [place], [place] [address]    Special Requests   Final    BOTTLES DRAWN AEROBIC AND ANAEROBIC Blood Culture adequate volume Performed at [facility], [address], [place], [place] [address]    Culture  Setup Time   Final    GRAM POSITIVE COCCI Gram Stain Report Called to,Read Back By and Verified With: BONDURANT, N. AT 0223 ON 03/30/2018 BY EVA AEROBIC BOTTLE ONLY Performed at [facility] Performed at [facility], [address], [place], [place] [address]    Culture (A)  Final    STAPHYLOCOCCUS AUREUS SUSCEPTIBILITIES PERFORMED ON PREVIOUS CULTURE WITHIN THE LAST 5 DAYS. Performed at  [facility], [address] [place], [place] [address]    Report Status 04/01/2018 FINAL  Final  Blood Culture (routine x 2)     Status: Abnormal   Collection Time: 03/29/18 11:35 AM  Result Value Ref Range Status   Specimen Description   Final    BLOOD RIGHT HAND Performed at [facility], [address], [place], [place] [address]    Special Requests   Final    BOTTLES DRAWN AEROBIC AND ANAEROBIC Blood Culture adequate volume Performed at [facility], [address], [place], [place] [address]    Culture  Setup Time   Final    GRAM POSITIVE COCCI Gram Stain Report Called to,Read Back By and Verified With: BONDURANT,N. AT 0223 ON 03/30/2018 BY EVA IN BOTH AEROBIC AND ANAEROBIC BOTTLES Performed at [facility]    Culture METHICILLIN RESISTANT STAPHYLOCOCCUS AUREUS (A)  Final   Report Status 04/01/2018 FINAL  Final   Organism ID, Bacteria METHICILLIN RESISTANT STAPHYLOCOCCUS AUREUS  Final      Susceptibility   Methicillin resistant staphylococcus aureus - MIC*    CIPROFLOXACIN >=8 RESISTANT Resistant     ERYTHROMYCIN >=8 RESISTANT Resistant     GENTAMICIN <=0.5 SENSITIVE Sensitive     OXACILLIN >=4 RESISTANT Resistant     TETRACYCLINE <=1 SENSITIVE Sensitive     VANCOMYCIN <=0.5 SENSITIVE Sensitive     TRIMETH/SULFA <=10 SENSITIVE Sensitive     CLINDAMYCIN >=8 RESISTANT Resistant     RIFAMPIN <=0.5 SENSITIVE Sensitive     Inducible Clindamycin NEGATIVE Sensitive     * METHICILLIN RESISTANT STAPHYLOCOCCUS AUREUS  Urine culture     Status: Abnormal   Collection Time: 03/29/18 11:35 AM  Result Value Ref Range Status   Specimen Description   Final    URINE, CLEAN CATCH Performed at [facility], [address], [place], [place] [address]    Special Requests   Final    NONE Performed at [facility], [address] [place], [place] [address]    Culture >=100,000 COLONIES/mL KLEBSIELLA PNEUMONIAE (A)  Final   Report Status 04/01/2018 FINAL  Final   Organism ID,  Bacteria KLEBSIELLA PNEUMONIAE (A)  Final      Susceptibility   Klebsiella pneumoniae - MIC*    AMPICILLIN >=32 RESISTANT Resistant     CEFAZOLIN >=64 RESISTANT Resistant     CEFTRIAXONE <=1 SENSITIVE Sensitive  CIPROFLOXACIN >=4 RESISTANT Resistant     GENTAMICIN >=16 RESISTANT Resistant     IMIPENEM 0.5 SENSITIVE Sensitive     NITROFURANTOIN 128 RESISTANT Resistant     TRIMETH/SULFA >=320 RESISTANT Resistant     AMPICILLIN/SULBACTAM >=32 RESISTANT Resistant     PIP/TAZO 32 INTERMEDIATE Intermediate     Extended ESBL NEGATIVE Sensitive     * >=100,000 COLONIES/mL KLEBSIELLA PNEUMONIAE  Blood Culture ID Panel (Reflexed)     Status: Abnormal   Collection Time: 03/29/18 11:35 AM  Result Value Ref Range Status   Enterococcus species NOT DETECTED NOT DETECTED Final   Listeria monocytogenes NOT DETECTED NOT DETECTED Final   Staphylococcus species DETECTED (A) NOT DETECTED Final    Comment: CRITICAL RESULT CALLED TO, READ BACK BY AND VERIFIED WITH: LORI POOLE PHARMD AT 0907 ON [id number] BY SJW    Staphylococcus aureus DETECTED (A) NOT DETECTED Final    Comment: Methicillin (oxacillin)-resistant Staphylococcus aureus (MRSA). MRSA is predictably resistant to beta-lactam antibiotics (except ceftaroline). Preferred therapy is vancomycin unless clinically contraindicated. Patient requires contact precautions if  hospitalized. CRITICAL RESULT CALLED TO, READ BACK BY AND VERIFIED WITH: LORI POOLE PHARMD AT 8657 ON [id number] BY SJW    Methicillin resistance DETECTED (A) NOT DETECTED Final    Comment: CRITICAL RESULT CALLED TO, READ BACK BY AND VERIFIED WITH: LORI POOLE PHARMD AT 0907 ON [id number] BY SJW    Streptococcus species NOT DETECTED NOT DETECTED Final   Streptococcus agalactiae NOT DETECTED NOT DETECTED Final   Streptococcus pneumoniae NOT DETECTED NOT DETECTED Final   Streptococcus pyogenes NOT DETECTED NOT DETECTED Final   Acinetobacter baumannii NOT DETECTED NOT DETECTED Final    Enterobacteriaceae species NOT DETECTED NOT DETECTED Final   Enterobacter cloacae complex NOT DETECTED NOT DETECTED Final   Escherichia coli NOT DETECTED NOT DETECTED Final   Klebsiella oxytoca NOT DETECTED NOT DETECTED Final   Klebsiella pneumoniae NOT DETECTED NOT DETECTED Final   Proteus species NOT DETECTED NOT DETECTED Final   Serratia marcescens NOT DETECTED NOT DETECTED Final   Haemophilus influenzae NOT DETECTED NOT DETECTED Final   Neisseria meningitidis NOT DETECTED NOT DETECTED Final   Pseudomonas aeruginosa NOT DETECTED NOT DETECTED Final   Candida albicans NOT DETECTED NOT DETECTED Final   Candida glabrata NOT DETECTED NOT DETECTED Final   Candida krusei NOT DETECTED NOT DETECTED Final   Candida parapsilosis NOT DETECTED NOT DETECTED Final   Candida tropicalis NOT DETECTED NOT DETECTED Final    Comment: Performed at [facility], [address] [place], [place] [address]  MRSA PCR Screening     Status: Abnormal   Collection Time: 03/29/18  2:30 PM  Result Value Ref Range Status   MRSA by PCR POSITIVE (A) NEGATIVE Final    Comment:        The GeneXpert MRSA Assay (FDA approved for NASAL specimens only), is one component of a comprehensive MRSA colonization surveillance program. It is not intended to diagnose MRSA infection nor to guide or monitor treatment for MRSA infections. RESULT CALLED TO, READ BACK BY AND VERIFIED WITH: BONDURANT,R @ 2057 ON 03/29/18 BY JUW Performed at [facility], [address] [place], [place] [address]   Culture, blood (Routine X 2) w Reflex to ID Panel     Status: None (Preliminary result)   Collection Time: 03/30/18  1:25 PM  Result Value Ref Range Status   Specimen Description   Final    BLOOD LEFT ARM Performed at [facility], [address], [place], [place] [address]    Special Requests   Final    BOTTLES DRAWN AEROBIC ONLY Blood Culture adequate volume Performed at [facility], [address], [place], [place]  [address]    Culture  Setup Time   Final    GRAM POSITIVE COCCI AEROBIC BOTTLE ONLY Gram Stain Report Called to,Read Back By and Verified With: T.BULLOCK, RN @ 0407 ON 5.22.19 BY L.BOWMAN Performed at [facility], [address], [place], [place] [address]    Culture [facility] POSITIVE COCCI  Final   Report Status PENDING  Incomplete  Culture, blood (Routine X 2) w Reflex to ID Panel     Status: None (Preliminary result)   Collection Time: 03/30/18  1:26 PM  Result Value Ref Range Status   Specimen Description BLOOD RIGHT WRIST  Final   Special Requests   Final    BOTTLES DRAWN AEROBIC AND ANAEROBIC Blood Culture results may not be optimal due to an inadequate volume of blood received in culture bottles   Culture   Final    NO GROWTH 3 DAYS Performed at [facility], [address], [place], [place] [address]    Report Status PENDING  Incomplete         Radiology Studies: No results found.      Scheduled Meds: . Chlorhexidine Gluconate Cloth  6 each Topical Q0600  . docusate sodium  100 mg Oral QODAY  . DULoxetine  60 mg Oral QHS  . feeding supplement (GLUCERNA SHAKE)  237 mL Oral BID BM  . feeding supplement (GLUCERNA SHAKE)  237 mL Oral TID BM  . heparin  5,000 Units Subcutaneous Q8H  . hydrocortisone  1 application Rectal BID  . insulin aspart  0-9 Units Subcutaneous TID WC  . mouth rinse  15 mL Mouth Rinse BID  . metoprolol tartrate  12.5 mg Oral BID  . mupirocin ointment  1 application Nasal BID  . pantoprazole  40 mg Oral Daily  . polyethylene glycol  17 g Oral Daily  . potassium chloride  40 mEq Oral Once  . predniSONE  5 mg Oral Q breakfast  . pregabalin  25 mg Oral BID  . sodium chloride flush  3 mL Intravenous Q12H   Continuous Infusions: . sodium chloride 75 mL/hr at 04/01/18 2107  . sodium chloride    . vancomycin Stopped (04/01/18 1736)     LOS: 4 days    Time spent: 35    Delaine Lame, MD [facility]  If 7PM-7AM, please contact  night-coverage [email or web address] Password [facility] 04/02/2018, 11:28 AM

## 2018-04-02 NOTE — Clinical Social Work Note (Signed)
Patient Information   Patient Name Carrie Heath, Carrie Heath ([id number]) Sex Female DOB 08-17-1921 SSN [id number] Carrie Heath  A341 A341-01  Patient Demographics   Address [address] [place] [address] Phone [phone] [facility]) [phone] (Mobile) *Preferred*  Patient Ethnicity & Race   Ethnic Group Patient Race  Not Hispanic or Latino White or Caucasian  Emergency Contact(s)   Name Relation Home Work Mobile  Carrie Heath Son [id number]  [phone]  Carrie Heath [phone]  [phone]  Carrie Heath [phone]    Sister, Carbone Other [phone]    Documents on File    Status Date Received Description  Documents for the Patient  EMR Medication Summary Not Received    EMR Immunization Summary Not Received    EMR Problem Summary Not Received    EMR Patient Summary Not Received    [place] HIPAA NOTICE OF PRIVACY - Scanned Not Received    [place] E-Signature HIPAA Notice of Privacy Received 04/02/11   [facility] E-Signature HIPAA Notice of Privacy Spanish Not Received    Driver's License Not Received    Advance Directives/Living Will/HCPOA/POA Not Received    Insurance Heath Received 04/02/11   Insurance Heath Not Received    AMB Correspondence Not Received  PT Treatment Plan 07/12 [place]  Financial Application Not Received    AMB Correspondence Not Received  08/12 pot Carrie Heath PT   AMB Correspondence Not Received  08/12 Office Note [place] PT   [employer] Heath Not Received    [employer] Heath Not Received    Release of Information Not Received    Insurance Heath Not Received    AMB Intake Forms/Questionnaires Not Received    AMB HH/NH/Hospice Not Received  01/14 order Carrie Heath Received 02/03/13   Insurance Heath Not Received    AMB Provider Completed Forms Not Received  11/14 FMLA [employer]  Insurance Heath Received 03/14/14 Medicare/BCBS  Insurance Heath Received 01/02/16 medicare-bcbs  HIM ROI  Authorization (Expired) 10/25/14 Need discharge summary for recent hospital stay (HAR# [id number]).  Release of Information  10/28/14   AMB Correspondence  11/09/14 TELEPHONE ADVICE RECORD Carrie Heath  HIM ROI Authorization (Expired) 11/22/14 Hospital discharge summary and any additional lab and/or doctor reports on patient.  Release of Information  11/23/14   Insurance Heath   [employer] 2016  Insurance Heath     Insurance Heath Received 01/17/15 MEDICARE/BCBS/AETNA/2016  Other Photo ID Not Received    AMB HH/NH/Hospice  01/31/15 PROVIDER VISIT FORM [facility]  AMB Provider Completed Forms  01/31/15 RX [place] Heath  AMB Provider Completed Forms  02/15/15 ORDER [place]  AMB Provider Completed Forms  02/15/15 PHYSICIAN ORDER [facility]  AMB HH/NH/Hospice  09/14/15 CMN [place]  Release of Information Received 01/02/16 DPR Signed on 2.3.17 / Cancellation - No Show Policy [facility]  HIM ROI Authorization  01/03/16 [facility]--01/02/16 prog note to PCP (hall)  HIM ROI Authorization  01/17/16 [facility]--01/02/16 labs to [employer]  HIM ROI Authorization  01/23/16 [facility]--01/18/16 labs to [place]  AMB Provider Completed Forms  02/17/16 PROVIDER VISIT FORM [facility]  AMB Provider Completed Forms (Deleted) 01/31/15 Carrie Heath  Documents for the Encounter  AOB (Assignment of Insurance Benefits) Received 03/29/18 unable to sign due to medical condition  E-signature AOB     MEDICARE RIGHTS Received 03/29/18 unable to sign due to medical condition  E-signature Medicare Rights     EMS Run Sheet Received 03/29/18   ED Patient  Billing Extract   ED PB Billing Extract  Cardiac Monitoring Strip Received 03/29/18   Cardiac Monitoring Strip Shift Summary Received 03/29/18   EKG Received 03/30/18   Admission Information   Attending Provider Admitting Provider Admission Type Admission Date/Time  Carrie Heath Carrie Heath Emergency 03/29/18  1107  Discharge Date Hospital Service Auth/Cert Status Service Area   Internal Medicine Incomplete [facility]  Unit Room/Bed Admission Status   AP-DEPT 300 A341/A341-01 Admission (Confirmed)   Admission   Complaint  [facility] Account   Name Acct ID Class Status Primary Coverage  Carrie Heath [id number] Inpatient Open MEDICARE - MEDICARE PART A AND B      Guarantor Account (for Hospital Account [id number])   Name Relation to Pt Service Area Active? Acct Type  Carrie Heath [facility] Yes Personal/Family  Address Phone    [address] [place], [place] [address] [phone](H)        Coverage Information (for Hospital Account [id number])   1. MEDICARE/MEDICARE PART A AND B   F/O Payor/Plan Precert #  MEDICARE/MEDICARE PART A AND B   Subscriber Subscriber #  Carrie Heath [id number]  Address Phone  [address] [place], [place] [address]   2. BLUE CROSS BLUE SHIELD/BCBS SUPPLEMENT   F/O Payor/Plan Precert #  BLUE CROSS BLUE SHIELD/BCBS SUPPLEMENT   Subscriber Subscriber #  Carrie Heath [id number]  Address Phone  [address] [place], [place] [address] [phone]

## 2018-04-02 NOTE — Discharge Summary (Signed)
Physician Discharge Summary  Carrie Heath BHA:[id number] DOB: Dec 22, 1920 DOA: 03/29/2018  PCP: Nita Sells Z (Inactive)  Admit date: 03/29/2018 Discharge date: 04/02/2018  Admitted From: Assisted living facility Disposition: Residential hospice  Recommendations for Outpatient Follow-up:  1. 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  Blood cultures grew out MRSA.  Echocardiogram showed vegetation on mitral valve.  Patient was maintained on IV vancomycin without significant improvement in her mental status or her clinical status.  She continued to grow out blood cultures with MRSA.  Infectious disease was consulted and recommended vancomycin and also discussion about goals of care with the family to the patient's advanced age and medical comorbidities.  Discussion was had with the family was had and it was opted based on patient's living will transition the patient to inpatient hospice as she has less than 6 weeks prognosis.  All antibiotics were stopped per request of the family.  All IV fluids were stopped per request of the family.  #) Altered mental status in the setting of dementia: Patient has known dementia.  She continues to have altered mental status despite appropriate IV antibiotics.  The decision was made to transition the patient to comfort care.  #) Paroxysmal atrial fibrillation/chronic diastolic heart failure: Patient was maintained on telemetry without any changes.  Her home diltiazem was restarted on discharge as tachycardia might cause discomfort for her.  She was also restarted on her home furosemide to help with fluid management due to shortness of breath.  These can be continued or discontinued at  the discretion of the hospice team.  #) COPD: Patient was on chronic prednisone 5 mg daily.  This was continued.  #) Hyperlipidemia/hypertension: Patient's diltiazem was restarted on discharge to hospice.  Patient's Spironolactone was discharged on discharge to hospice.    #) Type 2 diabetes, gated by peripheral neuropathy: Patient's pregabalin was continued.  #) Multiple skin tears: This was likely secondary to advanced age and chronic steroid use.  Occlusive dressing was placed on it per wound care.  She was also continued on her home topical nystatin and zinc ointment.  Discharge Diagnoses:  Principal Problem:   Sepsis due to gram-negative UTI (HCC) Active Problems:   DM type 2 with diabetic peripheral neuropathy (HCC)   HLD (hyperlipidemia)   COPD mixed type (HCC)   Chronic diastolic HF (heart failure) (HCC)   Atrial fibrillation, chronic (HCC)   Benign essential HTN   Vascular dementia   Palliative care by specialist   Goals of care, counseling/discussion   Encounter for hospice care discussion    Discharge Instructions  Discharge Instructions    Call MD for:  severe uncontrolled pain   Complete by:  As directed    Call MD for:  severe uncontrolled pain   Complete by:  As directed    Diet - low sodium heart healthy   Complete by:  As directed    Diet - low sodium heart healthy   Complete by:  As directed    Increase activity slowly   Complete by:  As directed    Increase activity slowly   Complete by:  As directed      Allergies as of 04/02/2018   No Known Allergies     Medication List  STOP taking these medications   ciprofloxacin 250 MG tablet Commonly known as:  CIPRO   spironolactone 25 MG tablet Commonly known as:  ALDACTONE     TAKE these medications   acetaminophen 325 MG tablet Commonly known as:  TYLENOL Take 650 mg by mouth 3 (three) times daily.   albuterol 108 (90 Base) MCG/ACT inhaler Commonly known as:  PROAIR HFA Inhale 2 puffs  into the lungs every 4 (four) hours as needed for wheezing or shortness of breath.   BENGAY GREASELESS EX Apply 1 application topically 2 (two) times daily. Apply to knees topically two times a day for pain.   bisacodyl 10 MG/30ML Enem Commonly known as:  FLEET Place 10 mg rectally daily as needed (constipation).   carboxymethylcellulose 0.5 % Soln Commonly known as:  REFRESH PLUS Apply 1 drop to eye 3 (three) times daily as needed.   DESITIN 40 % Pste Generic drug:  Zinc Oxide Apply topically. Apply to gluteals three times a day for redness.   diltiazem 30 MG tablet Commonly known as:  CARDIZEM Take 30 mg by mouth 4 (four) times daily.   docusate sodium 100 MG capsule Commonly known as:  COLACE Take 100 mg by mouth every other day.   DULoxetine 30 MG capsule Commonly known as:  CYMBALTA Take 2 capsules by mouth at bedtime.   feeding supplement (GLUCERNA SHAKE) Liqd Take 237 mLs by mouth every morning.   furosemide 40 MG tablet Commonly known as:  LASIX TAKE (1) TABLET BY MOUTH TWICE DAILY. What changed:  See the new instructions.   hydrocortisone 2.5 % rectal cream Commonly known as:  ANUSOL-HC Place 1 application rectally 2 (two) times daily.   mineral oil liquid Take by mouth daily as needed for moderate constipation. Instill 2 drop in right ear one time a day for seven days for wax build up.   nystatin ointment Commonly known as:  MYCOSTATIN Apply 1 application topically. Apply affected areas topically as needed for fungal rash to abdominal folds, breasts and groin 2 times a day.   omeprazole 20 MG capsule Commonly known as:  PRILOSEC Take 1 capsule by mouth daily.   polyethylene glycol packet Commonly known as:  MIRALAX / GLYCOLAX Take 17 g by mouth daily.   predniSONE 5 MG tablet Commonly known as:  DELTASONE Take 1 tablet (5 mg total) by mouth daily with breakfast.   pregabalin 25 MG capsule Commonly known as:  LYRICA Take 25 mg by mouth 2 (two)  times daily.   traMADol 50 MG tablet Commonly known as:  ULTRAM Take 50 mg by mouth every 12 (twelve) hours. Reported on 02/05/2016       No Known Allergies  Consultations:  Palliative care  Infectious disease   Procedures/Studies: Ct Head Wo Contrast  Result Date: 03/29/2018 CLINICAL DATA:  Altered mental status since Thursday, urinary tract infection EXAM: CT HEAD WITHOUT CONTRAST TECHNIQUE: Contiguous axial images were obtained from the base of the skull through the vertex without intravenous contrast. The patient was a positioned obliquely on the gantry. COMPARISON:  03/20/2018 FINDINGS: Brain: Mild atrophy. No evidence of acute infarction, hemorrhage, hydrocephalus, extra-axial collection or mass lesion/mass effect. Vascular: Atherosclerotic and physiologic intracranial calcifications. Skull: Normal. Negative for fracture or focal lesion. Sinuses/Orbits: Fluid level in the sphenoid sinus. Otherwise negative. Other: None. IMPRESSION: 1. Mild atrophy, without bleed or other acute intracranial process. 2. Sphenoid sinus disease. Electronically Signed   By: Corlis Leak M.D.   On: 03/29/2018 13:00  Ct Head Wo Contrast  Result Date: 03/20/2018 CLINICAL DATA:  Altered mental status EXAM: CT HEAD WITHOUT CONTRAST TECHNIQUE: Contiguous axial images were obtained from the base of the skull through the vertex without intravenous contrast. COMPARISON:  Head CT 01/06/2015 FINDINGS: Brain: No mass lesion, intraparenchymal hemorrhage or extra-axial collection. No evidence of acute cortical infarct. Periventricular hypoattenuation suggesting chronic microvascular disease. Vascular: No hyperdense vessel or unexpected vascular calcification. Skull: Normal visualized skull base, calvarium and extracranial soft tissues. Sinuses/Orbits: No sinus fluid levels or advanced mucosal thickening. No mastoid effusion. Normal orbits. IMPRESSION: Chronic ischemic microangiopathy without acute intracranial abnormality.  Electronically Signed   By: Deatra Robinson M.D.   On: 03/20/2018 16:27   Dg Chest Port 1 View  Result Date: 03/29/2018 CLINICAL DATA:  SEPSIS, PER ER NOTE, Pt from [facility] for altered mental status since Thursday HISTORY OF CHF, DM, HTN, COPD EXAM: PORTABLE CHEST - 1 VIEW COMPARISON:  01/07/2018 FINDINGS: Worsening consolidation/atelectasis at the left lung base. Increased prominence of coarse right perihilar interstitial opacities. Heart size and mediastinal contours are within normal limits. Aortic Atherosclerosis (ICD10-170.0) Can't exclude small left pleural effusion.  No pneumothorax. Visualized bones unremarkable. IMPRESSION: 1. Worsening left lower lung atelectasis/consolidation possibly with effusion. Electronically Signed   By: Corlis Leak M.D.   On: 03/29/2018 13:03   Dg Shoulder Left  Result Date: 03/20/2018 CLINICAL DATA:  Left shoulder pain, no known injury, initial encounter EXAM: LEFT SHOULDER - 2+ VIEW COMPARISON:  None. FINDINGS: Degenerative changes of the acromioclavicular joint are noted. No acute fracture or dislocation is noted. No gross soft tissue abnormality is seen. The underlying bony thorax is within normal limits. IMPRESSION: Mild degenerative change without acute abnormality. Electronically Signed   By: Alcide Clever M.D.   On: 03/20/2018 13:31    Echo 04/01/2018:- Left ventricle: Wall thickness was increased in a pattern of mild   LVH. - Aortic valve: There was mild to moderate regurgitation. - Mitral valve: 1.8 x 1.1 cm vegetation on the atrial surface of   the anterior mitral leaflet Severely calcified annulus.   Moderately thickened, moderately calcified leaflets . There was   mild regurgitation. - Left atrium: The atrium was moderately dilated. - Right atrium: The atrium was moderately dilated. - Atrial septum: There was a patent foramen ovale. - Tricuspid valve: There was moderate regurgitation.   Subjective:   Discharge Exam: Vitals:    04/02/18 1004 04/02/18 1436  BP: 132/87 130/87  Pulse: 91 92  Resp: 18 18  Temp: (!) 97.5 F (36.4 C) 97.8 F (36.6 C)  SpO2: 100% 100%   Vitals:   04/01/18 2136 04/02/18 0622 04/02/18 1004 04/02/18 1436  BP: 124/86 119/84 132/87 130/87  Pulse: (!) 104 (!) 112 91 92  Resp:   18 18  Temp: 99 F (37.2 C) 98.3 F (36.8 C) (!) 97.5 F (36.4 C) 97.8 F (36.6 C)  TempSrc: Oral Oral Oral Oral  SpO2: 99% 100% 100% 100%  Weight:      Height:        G General exam: No acute distress Respiratory system: No increased work of breathing, anterior lung fields clear Cardiovascular system: Distant heart sounds, regular rate and rhythm, no murmurs Gastrointestinal system: Soft, nontender, no rebound or guarding Central nervous system: No focal neurological deficits Extremities: 2+ lower extremity edema Skin: Numerous skin tears Psychiatry: Unable to assess due to medical condition   The results of significant diagnostics from this hospitalization (including imaging, microbiology, ancillary and  laboratory) are listed below for reference.     Microbiology: Recent Results (from the past 240 hour(s))  Blood Culture (routine x 2)     Status: Abnormal   Collection Time: 03/29/18 11:35 AM  Result Value Ref Range Status   Specimen Description   Final    RIGHT ANTECUBITAL Performed at [facility], [address], [place], [place] [address]    Special Requests   Final    BOTTLES DRAWN AEROBIC AND ANAEROBIC Blood Culture adequate volume Performed at [facility], [address] [place], [place] [address]    Culture  Setup Time   Final    GRAM POSITIVE COCCI Gram Stain Report Called to,Read Back By and Verified With: BONDURANT, N. AT 0223 ON 03/30/2018 BY EVA AEROBIC BOTTLE ONLY Performed at [facility] Performed at [facility], [address] [place], [place] [address]    Culture (A)  Final    STAPHYLOCOCCUS AUREUS SUSCEPTIBILITIES PERFORMED ON PREVIOUS CULTURE WITHIN THE LAST  5 DAYS. Performed at [facility], [address] [place], [place] [address]    Report Status 04/01/2018 FINAL  Final  Blood Culture (routine x 2)     Status: Abnormal   Collection Time: 03/29/18 11:35 AM  Result Value Ref Range Status   Specimen Description   Final    BLOOD RIGHT HAND Performed at [facility], [address] [place], [place] [address]    Special Requests   Final    BOTTLES DRAWN AEROBIC AND ANAEROBIC Blood Culture adequate volume Performed at [facility], [address], [place], [place] [address]    Culture  Setup Time   Final    GRAM POSITIVE COCCI Gram Stain Report Called to,Read Back By and Verified With: BONDURANT,N. AT 0223 ON 03/30/2018 BY EVA IN BOTH AEROBIC AND ANAEROBIC BOTTLES Performed at [facility]    Culture METHICILLIN RESISTANT STAPHYLOCOCCUS AUREUS (A)  Final   Report Status 04/01/2018 FINAL  Final   Organism ID, Bacteria METHICILLIN RESISTANT STAPHYLOCOCCUS AUREUS  Final      Susceptibility   Methicillin resistant staphylococcus aureus - MIC*    CIPROFLOXACIN >=8 RESISTANT Resistant     ERYTHROMYCIN >=8 RESISTANT Resistant     GENTAMICIN <=0.5 SENSITIVE Sensitive     OXACILLIN >=4 RESISTANT Resistant     TETRACYCLINE <=1 SENSITIVE Sensitive     VANCOMYCIN <=0.5 SENSITIVE Sensitive     TRIMETH/SULFA <=10 SENSITIVE Sensitive     CLINDAMYCIN >=8 RESISTANT Resistant     RIFAMPIN <=0.5 SENSITIVE Sensitive     Inducible Clindamycin NEGATIVE Sensitive     * METHICILLIN RESISTANT STAPHYLOCOCCUS AUREUS  Urine culture     Status: Abnormal   Collection Time: 03/29/18 11:35 AM  Result Value Ref Range Status   Specimen Description   Final    URINE, CLEAN CATCH Performed at [facility], [address], [place], [place] [address]    Special Requests   Final    NONE Performed at [facility], [address] [place], [place] [address]    Culture >=100,000 COLONIES/mL KLEBSIELLA PNEUMONIAE (A)  Final   Report Status 04/01/2018 FINAL   Final   Organism ID, Bacteria KLEBSIELLA PNEUMONIAE (A)  Final      Susceptibility   Klebsiella pneumoniae - MIC*    AMPICILLIN >=32 RESISTANT Resistant     CEFAZOLIN >=64 RESISTANT Resistant     CEFTRIAXONE <=1 SENSITIVE Sensitive     CIPROFLOXACIN >=4 RESISTANT Resistant     GENTAMICIN >=16 RESISTANT  Resistant     IMIPENEM 0.5 SENSITIVE Sensitive     NITROFURANTOIN 128 RESISTANT Resistant     TRIMETH/SULFA >=320 RESISTANT Resistant     AMPICILLIN/SULBACTAM >=32 RESISTANT Resistant     PIP/TAZO 32 INTERMEDIATE Intermediate     Extended ESBL NEGATIVE Sensitive     * >=100,000 COLONIES/mL KLEBSIELLA PNEUMONIAE  Blood Culture ID Panel (Reflexed)     Status: Abnormal   Collection Time: 03/29/18 11:35 AM  Result Value Ref Range Status   Enterococcus species NOT DETECTED NOT DETECTED Final   Listeria monocytogenes NOT DETECTED NOT DETECTED Final   Staphylococcus species DETECTED (A) NOT DETECTED Final    Comment: CRITICAL RESULT CALLED TO, READ BACK BY AND VERIFIED WITH: LORI POOLE PHARMD AT 0907 ON [id number] BY SJW    Staphylococcus aureus DETECTED (A) NOT DETECTED Final    Comment: Methicillin (oxacillin)-resistant Staphylococcus aureus (MRSA). MRSA is predictably resistant to beta-lactam antibiotics (except ceftaroline). Preferred therapy is vancomycin unless clinically contraindicated. Patient requires contact precautions if  hospitalized. CRITICAL RESULT CALLED TO, READ BACK BY AND VERIFIED WITH: LORI POOLE PHARMD AT 9518 ON [id number] BY SJW    Methicillin resistance DETECTED (A) NOT DETECTED Final    Comment: CRITICAL RESULT CALLED TO, READ BACK BY AND VERIFIED WITH: LORI POOLE PHARMD AT 0907 ON [id number] BY SJW    Streptococcus species NOT DETECTED NOT DETECTED Final   Streptococcus agalactiae NOT DETECTED NOT DETECTED Final   Streptococcus pneumoniae NOT DETECTED NOT DETECTED Final   Streptococcus pyogenes NOT DETECTED NOT DETECTED Final   Acinetobacter baumannii NOT DETECTED NOT  DETECTED Final   Enterobacteriaceae species NOT DETECTED NOT DETECTED Final   Enterobacter cloacae complex NOT DETECTED NOT DETECTED Final   Escherichia coli NOT DETECTED NOT DETECTED Final   Klebsiella oxytoca NOT DETECTED NOT DETECTED Final   Klebsiella pneumoniae NOT DETECTED NOT DETECTED Final   Proteus species NOT DETECTED NOT DETECTED Final   Serratia marcescens NOT DETECTED NOT DETECTED Final   Haemophilus influenzae NOT DETECTED NOT DETECTED Final   Neisseria meningitidis NOT DETECTED NOT DETECTED Final   Pseudomonas aeruginosa NOT DETECTED NOT DETECTED Final   Candida albicans NOT DETECTED NOT DETECTED Final   Candida glabrata NOT DETECTED NOT DETECTED Final   Candida krusei NOT DETECTED NOT DETECTED Final   Candida parapsilosis NOT DETECTED NOT DETECTED Final   Candida tropicalis NOT DETECTED NOT DETECTED Final    Comment: Performed at [facility], [address] [place], [place] [address]  MRSA PCR Screening     Status: Abnormal   Collection Time: 03/29/18  2:30 PM  Result Value Ref Range Status   MRSA by PCR POSITIVE (A) NEGATIVE Final    Comment:        The GeneXpert MRSA Assay (FDA approved for NASAL specimens only), is one component of a comprehensive MRSA colonization surveillance program. It is not intended to diagnose MRSA infection nor to guide or monitor treatment for MRSA infections. RESULT CALLED TO, READ BACK BY AND VERIFIED WITH: BONDURANT,R @ 2057 ON 03/29/18 BY JUW Performed at [facility], [address] [place], [place] [address]   Culture, blood (Routine X 2) w Reflex to ID Panel     Status: Abnormal (Preliminary result)   Collection Time: 03/30/18  1:25 PM  Result Value Ref Range Status   Specimen Description   Final    BLOOD LEFT ARM Performed at [facility], [address], [place], [place] [address]    Special Requests  Final    BOTTLES DRAWN AEROBIC ONLY Blood Culture adequate volume Performed at [facility], [address],  [place], [place] [address]    Culture  Setup Time   Final    GRAM POSITIVE COCCI AEROBIC BOTTLE ONLY Gram Stain Report Called to,Read Back By and Verified With: T.BULLOCK, RN @ 0407 ON 5.22.19 BY L.BOWMAN Performed at [facility], [address], [place], [place] [address]    Culture STAPHYLOCOCCUS SPECIES (COAGULASE NEGATIVE) (A)  Final   Report Status PENDING  Incomplete  Culture, blood (Routine X 2) w Reflex to ID Panel     Status: None (Preliminary result)   Collection Time: 03/30/18  1:26 PM  Result Value Ref Range Status   Specimen Description BLOOD RIGHT WRIST  Final   Special Requests   Final    BOTTLES DRAWN AEROBIC AND ANAEROBIC Blood Culture results may not be optimal due to an inadequate volume of blood received in culture bottles   Culture   Final    NO GROWTH 3 DAYS Performed at [facility], [address] [place], [place] [address]    Report Status PENDING  Incomplete     Labs: BNP (last 3 results) No results for input(s): BNP in the last 8760 hours. Basic Metabolic Panel: Recent Labs  Lab 03/29/18 1135 03/29/18 1639 03/30/18 0449 04/01/18 0421 04/02/18 0646  NA 134*  --  140  --  142  K 5.2*  --  3.9  --  3.2*  CL 96*  --  105  --  112*  CO2 26  --  24  --  22  GLUCOSE 210*  --  151*  --  123*  BUN 35*  --  30*  --  18  CREATININE 1.18*  --  0.89 0.70 0.66  CALCIUM 8.7*  --  8.0*  --  8.0*  MG  --  1.7  --   --  1.8  PHOS  --  3.0  --   --   --    Liver Function Tests: Recent Labs  Lab 03/29/18 1135 04/02/18 0646  AST 41 16  ALT 42 33  ALKPHOS 97 60  BILITOT 1.1 0.9  PROT 6.8 5.6*  ALBUMIN 2.6* 2.1*   No results for input(s): LIPASE, AMYLASE in the last 168 hours. No results for input(s): AMMONIA in the last 168 hours. CBC: Recent Labs  Lab 03/29/18 1135 03/30/18 0449 04/02/18 0646  WBC 24.3* 19.8* 15.2*  NEUTROABS 22.4*  --   --   HGB 13.4 12.1 12.0  HCT 41.2 38.6 38.8  MCV 99.3 100.8* 100.3*  PLT 265 232 244   Cardiac Enzymes: No  results for input(s): CKTOTAL, CKMB, CKMBINDEX, TROPONINI in the last 168 hours. BNP: Invalid input(s): POCBNP CBG: Recent Labs  Lab 04/01/18 1154 04/01/18 1635 04/01/18 2136 04/02/18 0807 04/02/18 1208  GLUCAP 158* 136* 176* 122* 168*   D-Dimer No results for input(s): DDIMER in the last 72 hours. Hgb A1c No results for input(s): HGBA1C in the last 72 hours. Lipid Profile No results for input(s): CHOL, HDL, LDLCALC, TRIG, CHOLHDL, LDLDIRECT in the last 72 hours. Thyroid function studies No results for input(s): TSH, T4TOTAL, T3FREE, THYROIDAB in the last 72 hours.  Invalid input(s): FREET3 Anemia work up No results for input(s): VITAMINB12, FOLATE, FERRITIN, TIBC, IRON, RETICCTPCT in the last 72 hours. Urinalysis    Component Value Date/Time   COLORURINE YELLOW 03/29/2018 1135   APPEARANCEUR CLOUDY (A) 03/29/2018 1135   LABSPEC 1.014  03/29/2018 1135   PHURINE 5.0 03/29/2018 1135   GLUCOSEU NEGATIVE 03/29/2018 1135   HGBUR SMALL (A) 03/29/2018 1135   BILIRUBINUR NEGATIVE 03/29/2018 1135   KETONESUR NEGATIVE 03/29/2018 1135   PROTEINUR 30 (A) 03/29/2018 1135   NITRITE NEGATIVE 03/29/2018 1135   LEUKOCYTESUR MODERATE (A) 03/29/2018 1135   Sepsis Labs Invalid input(s): PROCALCITONIN,  WBC,  LACTICIDVEN Microbiology Recent Results (from the past 240 hour(s))  Blood Culture (routine x 2)     Status: Abnormal   Collection Time: 03/29/18 11:35 AM  Result Value Ref Range Status   Specimen Description   Final    RIGHT ANTECUBITAL Performed at [facility], [address], [place], [place] [address]    Special Requests   Final    BOTTLES DRAWN AEROBIC AND ANAEROBIC Blood Culture adequate volume Performed at [facility], [address] [place], [place] [address]    Culture  Setup Time   Final    GRAM POSITIVE COCCI Gram Stain Report Called to,Read Back By and Verified With: BONDURANT, N. AT 0223 ON 03/30/2018 BY EVA AEROBIC BOTTLE ONLY Performed at [facility] Performed at [facility], [address], [place], [place] [address]    Culture (A)  Final    STAPHYLOCOCCUS AUREUS SUSCEPTIBILITIES PERFORMED ON PREVIOUS CULTURE WITHIN THE LAST 5 DAYS. Performed at [facility], [address], [place], [place] [address]    Report Status 04/01/2018 FINAL  Final  Blood Culture (routine x 2)     Status: Abnormal   Collection Time: 03/29/18 11:35 AM  Result Value Ref Range Status   Specimen Description   Final    BLOOD RIGHT HAND Performed at [facility], [address], [place], [place] [address]    Special Requests   Final    BOTTLES DRAWN AEROBIC AND ANAEROBIC Blood Culture adequate volume Performed at [facility], [address] [place], [place] [address]    Culture  Setup Time   Final    GRAM POSITIVE COCCI Gram Stain Report Called to,Read Back By and Verified With: BONDURANT,N. AT 0223 ON 03/30/2018 BY EVA IN BOTH AEROBIC AND ANAEROBIC BOTTLES Performed at [facility]    Culture METHICILLIN RESISTANT STAPHYLOCOCCUS AUREUS (A)  Final   Report Status 04/01/2018 FINAL  Final   Organism ID, Bacteria METHICILLIN RESISTANT STAPHYLOCOCCUS AUREUS  Final      Susceptibility   Methicillin resistant staphylococcus aureus - MIC*    CIPROFLOXACIN >=8 RESISTANT Resistant     ERYTHROMYCIN >=8 RESISTANT Resistant     GENTAMICIN <=0.5 SENSITIVE Sensitive     OXACILLIN >=4 RESISTANT Resistant     TETRACYCLINE <=1 SENSITIVE Sensitive     VANCOMYCIN <=0.5 SENSITIVE Sensitive     TRIMETH/SULFA <=10 SENSITIVE Sensitive     CLINDAMYCIN >=8 RESISTANT Resistant     RIFAMPIN <=0.5 SENSITIVE Sensitive     Inducible Clindamycin NEGATIVE Sensitive     * METHICILLIN RESISTANT STAPHYLOCOCCUS AUREUS  Urine culture     Status: Abnormal   Collection Time: 03/29/18 11:35 AM  Result Value Ref Range Status   Specimen Description   Final    URINE, CLEAN CATCH Performed at [facility], [address] [place], [place] [address]    Special  Requests   Final    NONE Performed at [facility], [address] [place], [place] [address]    Culture >=100,000 COLONIES/mL KLEBSIELLA PNEUMONIAE (A)  Final   Report Status 04/01/2018 FINAL  Final   Organism ID, Bacteria KLEBSIELLA  PNEUMONIAE (A)  Final      Susceptibility   Klebsiella pneumoniae - MIC*    AMPICILLIN >=32 RESISTANT Resistant     CEFAZOLIN >=64 RESISTANT Resistant     CEFTRIAXONE <=1 SENSITIVE Sensitive     CIPROFLOXACIN >=4 RESISTANT Resistant     GENTAMICIN >=16 RESISTANT Resistant     IMIPENEM 0.5 SENSITIVE Sensitive     NITROFURANTOIN 128 RESISTANT Resistant     TRIMETH/SULFA >=320 RESISTANT Resistant     AMPICILLIN/SULBACTAM >=32 RESISTANT Resistant     PIP/TAZO 32 INTERMEDIATE Intermediate     Extended ESBL NEGATIVE Sensitive     * >=100,000 COLONIES/mL KLEBSIELLA PNEUMONIAE  Blood Culture ID Panel (Reflexed)     Status: Abnormal   Collection Time: 03/29/18 11:35 AM  Result Value Ref Range Status   Enterococcus species NOT DETECTED NOT DETECTED Final   Listeria monocytogenes NOT DETECTED NOT DETECTED Final   Staphylococcus species DETECTED (A) NOT DETECTED Final    Comment: CRITICAL RESULT CALLED TO, READ BACK BY AND VERIFIED WITH: LORI POOLE PHARMD AT 0907 ON [id number] BY SJW    Staphylococcus aureus DETECTED (A) NOT DETECTED Final    Comment: Methicillin (oxacillin)-resistant Staphylococcus aureus (MRSA). MRSA is predictably resistant to beta-lactam antibiotics (except ceftaroline). Preferred therapy is vancomycin unless clinically contraindicated. Patient requires contact precautions if  hospitalized. CRITICAL RESULT CALLED TO, READ BACK BY AND VERIFIED WITH: LORI POOLE PHARMD AT 0454 ON [id number] BY SJW    Methicillin resistance DETECTED (A) NOT DETECTED Final    Comment: CRITICAL RESULT CALLED TO, READ BACK BY AND VERIFIED WITH: LORI POOLE PHARMD AT 0907 ON [id number] BY SJW    Streptococcus species NOT DETECTED NOT DETECTED Final   Streptococcus  agalactiae NOT DETECTED NOT DETECTED Final   Streptococcus pneumoniae NOT DETECTED NOT DETECTED Final   Streptococcus pyogenes NOT DETECTED NOT DETECTED Final   Acinetobacter baumannii NOT DETECTED NOT DETECTED Final   Enterobacteriaceae species NOT DETECTED NOT DETECTED Final   Enterobacter cloacae complex NOT DETECTED NOT DETECTED Final   Escherichia coli NOT DETECTED NOT DETECTED Final   Klebsiella oxytoca NOT DETECTED NOT DETECTED Final   Klebsiella pneumoniae NOT DETECTED NOT DETECTED Final   Proteus species NOT DETECTED NOT DETECTED Final   Serratia marcescens NOT DETECTED NOT DETECTED Final   Haemophilus influenzae NOT DETECTED NOT DETECTED Final   Neisseria meningitidis NOT DETECTED NOT DETECTED Final   Pseudomonas aeruginosa NOT DETECTED NOT DETECTED Final   Candida albicans NOT DETECTED NOT DETECTED Final   Candida glabrata NOT DETECTED NOT DETECTED Final   Candida krusei NOT DETECTED NOT DETECTED Final   Candida parapsilosis NOT DETECTED NOT DETECTED Final   Candida tropicalis NOT DETECTED NOT DETECTED Final    Comment: Performed at [facility], [address] [place], [place] [address]  MRSA PCR Screening     Status: Abnormal   Collection Time: 03/29/18  2:30 PM  Result Value Ref Range Status   MRSA by PCR POSITIVE (A) NEGATIVE Final    Comment:        The GeneXpert MRSA Assay (FDA approved for NASAL specimens only), is one component of a comprehensive MRSA colonization surveillance program. It is not intended to diagnose MRSA infection nor to guide or monitor treatment for MRSA infections. RESULT CALLED TO, READ BACK BY AND VERIFIED WITH: BONDURANT,R @ 2057 ON 03/29/18 BY JUW Performed at [facility], [address] [place], [place] [address]   Culture, blood (Routine X 2) w Reflex to ID  Panel     Status: Abnormal (Preliminary result)   Collection Time: 03/30/18  1:25 PM  Result Value Ref Range Status   Specimen Description   Final    BLOOD LEFT  ARM Performed at [facility], [address], [place], [place] [address]    Special Requests   Final    BOTTLES DRAWN AEROBIC ONLY Blood Culture adequate volume Performed at [facility], [address] [place], [place] [address]    Culture  Setup Time   Final    GRAM POSITIVE COCCI AEROBIC BOTTLE ONLY Gram Stain Report Called to,Read Back By and Verified With: T.BULLOCK, RN @ 0407 ON 5.22.19 BY L.BOWMAN Performed at [facility], [address] [place], [place] [address]    Culture STAPHYLOCOCCUS SPECIES (COAGULASE NEGATIVE) (A)  Final   Report Status PENDING  Incomplete  Culture, blood (Routine X 2) w Reflex to ID Panel     Status: None (Preliminary result)   Collection Time: 03/30/18  1:26 PM  Result Value Ref Range Status   Specimen Description BLOOD RIGHT WRIST  Final   Special Requests   Final    BOTTLES DRAWN AEROBIC AND ANAEROBIC Blood Culture results may not be optimal due to an inadequate volume of blood received in culture bottles   Culture   Final    NO GROWTH 3 DAYS Performed at [facility], [address], [place], [place] [address]    Report Status PENDING  Incomplete     Time coordinating discharge: Over 30 minutes  SIGNED:   Delaine Lame, MD  [facility] 04/02/2018, 2:59 PM   If 7PM-7AM, please contact night-coverage [email or web address] Password [email or web address]

## 2018-04-02 NOTE — Progress Notes (Signed)
Palliative: [name] is lying quietly in bed.  She will briefly make but not keep eye contact.  Present today at bedside are sons Carrie Heath and Carrie Heath.  [name] is able to make her basic needs known, she denies pain. We talked about residential hospice, all questions answered.  Support provided. Conference with nursing staff related to plan of care and disposition. Conference with social worker related to plan of care and disposition. 35 minutes [name], NP Palliative Medicine Team Team Phone # [phone]

## 2018-04-03 LAB — CULTURE, BLOOD (ROUTINE X 2): SPECIAL REQUESTS: ADEQUATE

## 2018-04-04 LAB — CULTURE, BLOOD (ROUTINE X 2): Culture: NO GROWTH

## 2018-04-07 ENCOUNTER — Telehealth: Payer: Self-pay

## 2018-04-08 NOTE — Telephone Encounter (Signed)
Phone call received from [place] at facility to request [facility] be notified that DME needs to be picked up as patient is being transferred to hospice of [facility].

## 2018-04-11 DEATH — deceased
